# Patient Record
Sex: Female | Born: 1950 | Race: White | Hispanic: No | Marital: Married | Smoking: Never smoker
Health system: Southern US, Community
[De-identification: ages and names within clinical notes are randomized; demographics above are authoritative.]

## PROBLEM LIST (undated history)

## (undated) DIAGNOSIS — S82001A Unspecified fracture of right patella, initial encounter for closed fracture: Secondary | ICD-10-CM

## (undated) DIAGNOSIS — E785 Hyperlipidemia, unspecified: Secondary | ICD-10-CM

## (undated) DIAGNOSIS — S62109A Fracture of unspecified carpal bone, unspecified wrist, initial encounter for closed fracture: Secondary | ICD-10-CM

## (undated) DIAGNOSIS — M1990 Unspecified osteoarthritis, unspecified site: Secondary | ICD-10-CM

## (undated) DIAGNOSIS — M810 Age-related osteoporosis without current pathological fracture: Secondary | ICD-10-CM

## (undated) DIAGNOSIS — F509 Eating disorder, unspecified: Secondary | ICD-10-CM

## (undated) DIAGNOSIS — I10 Essential (primary) hypertension: Secondary | ICD-10-CM

## (undated) HISTORY — PX: HYSTEROSCOPY: SHX211

## (undated) HISTORY — DX: Essential (primary) hypertension: I10

## (undated) HISTORY — PX: AUGMENTATION MAMMAPLASTY: SUR837

## (undated) HISTORY — DX: Fracture of unspecified carpal bone, unspecified wrist, initial encounter for closed fracture: S62.109A

## (undated) HISTORY — PX: BREAST ENHANCEMENT SURGERY: SHX7

## (undated) HISTORY — DX: Age-related osteoporosis without current pathological fracture: M81.0

## (undated) HISTORY — DX: Eating disorder, unspecified: F50.9

---

## 1998-05-03 ENCOUNTER — Other Ambulatory Visit: Admission: RE | Admit: 1998-05-03 | Discharge: 1998-05-03 | Payer: Self-pay | Admitting: Obstetrics and Gynecology

## 1998-06-07 ENCOUNTER — Other Ambulatory Visit: Admission: RE | Admit: 1998-06-07 | Discharge: 1998-06-07 | Payer: Self-pay | Admitting: Obstetrics and Gynecology

## 1998-10-11 ENCOUNTER — Other Ambulatory Visit: Admission: RE | Admit: 1998-10-11 | Discharge: 1998-10-11 | Payer: Self-pay | Admitting: Obstetrics and Gynecology

## 1999-03-20 ENCOUNTER — Other Ambulatory Visit: Admission: RE | Admit: 1999-03-20 | Discharge: 1999-03-20 | Payer: Self-pay | Admitting: Obstetrics and Gynecology

## 1999-07-22 ENCOUNTER — Encounter: Admission: RE | Admit: 1999-07-22 | Discharge: 1999-07-22 | Payer: Self-pay | Admitting: Obstetrics and Gynecology

## 1999-09-11 ENCOUNTER — Other Ambulatory Visit: Admission: RE | Admit: 1999-09-11 | Discharge: 1999-09-11 | Payer: Self-pay | Admitting: Obstetrics and Gynecology

## 2000-02-24 ENCOUNTER — Other Ambulatory Visit: Admission: RE | Admit: 2000-02-24 | Discharge: 2000-02-24 | Payer: Self-pay | Admitting: Obstetrics and Gynecology

## 2000-07-23 ENCOUNTER — Encounter: Admission: RE | Admit: 2000-07-23 | Discharge: 2000-07-23 | Payer: Self-pay | Admitting: Obstetrics and Gynecology

## 2000-07-23 ENCOUNTER — Encounter: Payer: Self-pay | Admitting: Obstetrics and Gynecology

## 2001-03-18 ENCOUNTER — Other Ambulatory Visit: Admission: RE | Admit: 2001-03-18 | Discharge: 2001-03-18 | Payer: Self-pay | Admitting: Obstetrics and Gynecology

## 2001-03-23 ENCOUNTER — Encounter: Payer: Self-pay | Admitting: Obstetrics and Gynecology

## 2001-03-23 ENCOUNTER — Encounter: Admission: RE | Admit: 2001-03-23 | Discharge: 2001-03-23 | Payer: Self-pay | Admitting: Obstetrics and Gynecology

## 2001-07-27 ENCOUNTER — Encounter: Payer: Self-pay | Admitting: Obstetrics and Gynecology

## 2001-07-27 ENCOUNTER — Encounter: Admission: RE | Admit: 2001-07-27 | Discharge: 2001-07-27 | Payer: Self-pay | Admitting: Obstetrics and Gynecology

## 2002-03-15 ENCOUNTER — Other Ambulatory Visit: Admission: RE | Admit: 2002-03-15 | Discharge: 2002-03-15 | Payer: Self-pay | Admitting: Obstetrics and Gynecology

## 2002-08-02 ENCOUNTER — Encounter: Admission: RE | Admit: 2002-08-02 | Discharge: 2002-08-02 | Payer: Self-pay | Admitting: *Deleted

## 2002-08-02 ENCOUNTER — Encounter: Payer: Self-pay | Admitting: Obstetrics and Gynecology

## 2003-03-17 ENCOUNTER — Other Ambulatory Visit: Admission: RE | Admit: 2003-03-17 | Discharge: 2003-03-17 | Payer: Self-pay | Admitting: Obstetrics and Gynecology

## 2003-04-04 ENCOUNTER — Encounter: Admission: RE | Admit: 2003-04-04 | Discharge: 2003-07-03 | Payer: Self-pay | Admitting: Family Medicine

## 2003-07-05 ENCOUNTER — Encounter: Admission: RE | Admit: 2003-07-05 | Discharge: 2003-10-03 | Payer: Self-pay | Admitting: Family Medicine

## 2003-08-17 ENCOUNTER — Encounter: Admission: RE | Admit: 2003-08-17 | Discharge: 2003-08-17 | Payer: Self-pay | Admitting: Obstetrics and Gynecology

## 2004-03-18 ENCOUNTER — Other Ambulatory Visit: Admission: RE | Admit: 2004-03-18 | Discharge: 2004-03-18 | Payer: Self-pay | Admitting: Obstetrics and Gynecology

## 2004-08-19 ENCOUNTER — Encounter: Admission: RE | Admit: 2004-08-19 | Discharge: 2004-08-19 | Payer: Self-pay | Admitting: Obstetrics and Gynecology

## 2005-04-25 ENCOUNTER — Other Ambulatory Visit: Admission: RE | Admit: 2005-04-25 | Discharge: 2005-04-25 | Payer: Self-pay | Admitting: Obstetrics and Gynecology

## 2005-10-17 ENCOUNTER — Encounter: Admission: RE | Admit: 2005-10-17 | Discharge: 2005-10-17 | Payer: Self-pay | Admitting: Obstetrics and Gynecology

## 2006-05-06 ENCOUNTER — Other Ambulatory Visit: Admission: RE | Admit: 2006-05-06 | Discharge: 2006-05-06 | Payer: Self-pay | Admitting: Obstetrics and Gynecology

## 2006-10-26 ENCOUNTER — Encounter: Admission: RE | Admit: 2006-10-26 | Discharge: 2006-10-26 | Payer: Self-pay | Admitting: Obstetrics and Gynecology

## 2007-05-21 ENCOUNTER — Other Ambulatory Visit: Admission: RE | Admit: 2007-05-21 | Discharge: 2007-05-21 | Payer: Self-pay | Admitting: Obstetrics and Gynecology

## 2007-10-29 ENCOUNTER — Encounter: Admission: RE | Admit: 2007-10-29 | Discharge: 2007-10-29 | Payer: Self-pay | Admitting: Obstetrics and Gynecology

## 2008-05-22 ENCOUNTER — Other Ambulatory Visit: Admission: RE | Admit: 2008-05-22 | Discharge: 2008-05-22 | Payer: Self-pay | Admitting: Obstetrics and Gynecology

## 2008-05-29 ENCOUNTER — Encounter: Admission: RE | Admit: 2008-05-29 | Discharge: 2008-05-29 | Payer: Self-pay | Admitting: Obstetrics and Gynecology

## 2008-11-30 ENCOUNTER — Encounter: Admission: RE | Admit: 2008-11-30 | Discharge: 2008-11-30 | Payer: Self-pay | Admitting: Obstetrics and Gynecology

## 2009-12-06 ENCOUNTER — Encounter: Admission: RE | Admit: 2009-12-06 | Discharge: 2009-12-06 | Payer: Self-pay | Admitting: Obstetrics and Gynecology

## 2010-02-26 ENCOUNTER — Encounter: Admission: RE | Admit: 2010-02-26 | Discharge: 2010-02-26 | Payer: Self-pay | Admitting: Obstetrics and Gynecology

## 2010-10-27 ENCOUNTER — Encounter: Payer: Self-pay | Admitting: Obstetrics and Gynecology

## 2010-11-27 ENCOUNTER — Other Ambulatory Visit: Payer: Self-pay | Admitting: Obstetrics and Gynecology

## 2010-11-27 DIAGNOSIS — Z1231 Encounter for screening mammogram for malignant neoplasm of breast: Secondary | ICD-10-CM

## 2010-12-10 ENCOUNTER — Ambulatory Visit: Payer: Self-pay

## 2010-12-12 ENCOUNTER — Ambulatory Visit
Admission: RE | Admit: 2010-12-12 | Discharge: 2010-12-12 | Disposition: A | Discharge status: Home / Self Care | Payer: BC Managed Care – PPO | Source: Ambulatory Visit | Attending: Obstetrics and Gynecology | Admitting: Obstetrics and Gynecology

## 2010-12-12 DIAGNOSIS — Z1231 Encounter for screening mammogram for malignant neoplasm of breast: Secondary | ICD-10-CM

## 2011-07-15 ENCOUNTER — Other Ambulatory Visit: Payer: Self-pay | Admitting: Obstetrics and Gynecology

## 2011-07-15 DIAGNOSIS — M949 Disorder of cartilage, unspecified: Secondary | ICD-10-CM

## 2011-07-22 ENCOUNTER — Ambulatory Visit
Admission: RE | Admit: 2011-07-22 | Discharge: 2011-07-22 | Disposition: A | Discharge status: Home / Self Care | Payer: BC Managed Care – PPO | Source: Ambulatory Visit | Attending: Obstetrics and Gynecology | Admitting: Obstetrics and Gynecology

## 2011-07-22 DIAGNOSIS — M899 Disorder of bone, unspecified: Secondary | ICD-10-CM

## 2011-10-07 DIAGNOSIS — S62109A Fracture of unspecified carpal bone, unspecified wrist, initial encounter for closed fracture: Secondary | ICD-10-CM

## 2011-10-07 HISTORY — DX: Fracture of unspecified carpal bone, unspecified wrist, initial encounter for closed fracture: S62.109A

## 2013-07-25 ENCOUNTER — Encounter: Payer: Self-pay | Admitting: Certified Nurse Midwife

## 2013-07-27 ENCOUNTER — Encounter: Payer: Self-pay | Admitting: Certified Nurse Midwife

## 2013-07-27 ENCOUNTER — Telehealth: Payer: Self-pay | Admitting: Certified Nurse Midwife

## 2013-07-27 ENCOUNTER — Ambulatory Visit (INDEPENDENT_AMBULATORY_CARE_PROVIDER_SITE_OTHER): Payer: BC Managed Care – PPO | Admitting: Certified Nurse Midwife

## 2013-07-27 VITALS — BP 116/68 | HR 68 | Resp 16 | Ht 63.75 in | Wt 141.0 lb

## 2013-07-27 DIAGNOSIS — Z0000 Encounter for general adult medical examination without abnormal findings: Secondary | ICD-10-CM

## 2013-07-27 DIAGNOSIS — Z01419 Encounter for gynecological examination (general) (routine) without abnormal findings: Secondary | ICD-10-CM

## 2013-07-27 DIAGNOSIS — M810 Age-related osteoporosis without current pathological fracture: Secondary | ICD-10-CM

## 2013-07-27 DIAGNOSIS — N951 Menopausal and female climacteric states: Secondary | ICD-10-CM

## 2013-07-27 MED ORDER — MEDROXYPROGESTERONE ACETATE 10 MG PO TABS: ORAL_TABLET | ORAL | Status: DC

## 2013-07-27 MED ORDER — ESTRADIOL 0.05 MG/24HR TD PTWK: 1.0000 | MEDICATED_PATCH | TRANSDERMAL | Status: DC

## 2013-07-27 NOTE — Telephone Encounter (Signed)
Cheryl Glass, okay to order Dexa? Last Dexa 07/2011.  Bone Mineral Density (BMD): 0.728 g/cm2  Young Adult T Score: -2.6  Z Score: -1.4  LEFT FEMUR NECK  Bone Mineral Density (BMD): 0.600 g/cm2  Young Adult T Score: -2.2  Z Score: -1.0  ASSESSMENT: Patient's diagnostic category is OSTEOPOROSIS by WHO  Criteria.

## 2013-07-27 NOTE — Telephone Encounter (Signed)
Chief Complaint  Patient presents with   bone density order  pt calling to get bone density order sent to the breast center.

## 2013-07-27 NOTE — Patient Instructions (Signed)

## 2013-07-27 NOTE — Progress Notes (Signed)
Note reviewed, agree with plan.  Junia Nygren, MD  

## 2013-07-27 NOTE — Telephone Encounter (Signed)
Yes, she has osteoporosis on medication

## 2013-07-27 NOTE — Telephone Encounter (Signed)
Ordered and sent for signature. Patient notified. She will call for an appointment.

## 2013-07-27 NOTE — Progress Notes (Signed)
62 y.o. G47P1001 Married Caucasian Fe here for annual exam. Having withdrawal bleeding with Provera each month, with Climara patch. Sees PCP for aex ,labs and hypertension management. Medication stable. Due for bone density this year will schedule. Has restarted Fosamax per PCP due to osteoporosis. No other health issues.  Patient's last menstrual period was 07/10/2013.          Sexually active: yes  The current method of family planning is husband vasectomy.    Exercising: yes  cardio & weights Smoker:  no  Health Maintenance: Pap:  07-20-12 neg HPV HR neg MMG:  2/13 Colonoscopy: none refuses ever BMD:   2012 TDaP:  2008 Labs: none Self breast exam: done occ   reports that she has never smoked. She does not have any smokeless tobacco history on file. She reports that she does not drink alcohol.  Past Medical History  Diagnosis Date  . Eating disorder   . Wrist fracture 2013    right  . Hypertension   . Osteoporosis     Past Surgical History  Procedure Laterality Date  . Hysteroscopy    . Cesarean section    . Breast enhancement surgery      Current Outpatient Prescriptions  Medication Sig Dispense Refill  . alendronate (FOSAMAX) 70 MG tablet Take 70 mg by mouth every 7 (seven) days. Take with a full glass of water on an empty stomach.      . Calcium-Vitamin D-Vitamin K (VIACTIV) 500-500-40 MG-UNT-MCG CHEW Chew by mouth.      . cholecalciferol (VITAMIN D) 1000 UNITS tablet Take 1,000 Units by mouth daily.      Marland Kitchen estradiol (CLIMARA - DOSED IN MG/24 HR) 0.05 mg/24hr patch Place 1 patch onto the skin once a week.      Marland Kitchen lisinopril (PRINIVIL,ZESTRIL) 10 MG tablet Take 10 mg by mouth daily.      . medroxyPROGESTERone (PROVERA) 10 MG tablet Take 10 mg by mouth daily.       No current facility-administered medications for this visit.    Family History  Problem Relation Age of Onset  . Thyroid disease Sister     hypothyroid  . Osteoporosis Maternal Grandmother   . Cancer  Father     pancreatic  . Diabetes Father     ROS:  Pertinent items are noted in HPI.  Otherwise, a comprehensive ROS was negative.  Exam:   BP 116/68  Pulse 68  Resp 16  Ht 5' 3.75" (1.619 m)  Wt 141 lb (63.957 kg)  BMI 24.4 kg/m2  LMP 07/10/2013 Height: 5' 3.75" (161.9 cm)  Ht Readings from Last 3 Encounters:  07/27/13 5' 3.75" (1.619 m)    General appearance: alert, cooperative and appears stated age Head: Normocephalic, without obvious abnormality, atraumatic Neck: no adenopathy, supple, symmetrical, trachea midline and thyroid normal to inspection and palpation Lungs: clear to auscultation bilaterally Breasts: normal appearance, no masses or tenderness, No nipple retraction or dimpling, No nipple discharge or bleeding, No axillary or supraclavicular adenopathy Heart: regular rate and rhythm Abdomen: soft, non-tender; no masses,  no organomegaly Extremities: extremities normal, atraumatic, no cyanosis or edema Skin: Skin color, texture, turgor normal. No rashes or lesions Lymph nodes: Cervical, supraclavicular, and axillary nodes normal. No abnormal inguinal nodes palpated Neurologic: Grossly normal   Pelvic: External genitalia:  no lesions              Urethra:  normal appearing urethra with no masses, tenderness or lesions  Bartholin's and Skene's: normal                 Vagina: normal appearing vagina with normal color and discharge, no lesions              Cervix: normal,non tender              Pap taken: no Bimanual Exam:  Uterus:  normal size, contour, position, consistency, mobility, non-tender and anteverted              Adnexa: normal adnexa and no mass, fullness, tenderness               Rectovaginal: Confirms               Anus:  normal sphincter tone, no lesions  A:  Well Woman with normal exam  Menopausal on HRT desires continuance  Hypertension stable medication with PCP management  Right hand second finger nodule  P:   Reviewed health and  wellness pertinent to exam  Discussed risks and benefits or HRT patient desires continuance. Discussed if bleeding occur other than right after provera completion needs to notify, agreeable  Rx Climara see order  Rx Provera see order  Continue follow up as recommended  Discussed finding with patient, recommended she see hand specialist, patient will schedule.  Pap smear as per guidelines   Mammogram yearly pap smear not taken today IFOB dispensed  counseled on breast self exam, mammography screening, use and side effects of HRT, menopause, adequate intake of calcium and vitamin D, diet and exercise  return annually or prn  An After Visit Summary was printed and given to the patient.

## 2013-07-29 ENCOUNTER — Ambulatory Visit
Admission: RE | Admit: 2013-07-29 | Discharge: 2013-07-29 | Disposition: A | Discharge status: Home / Self Care | Payer: BC Managed Care – PPO | Source: Ambulatory Visit | Attending: Certified Nurse Midwife | Admitting: Certified Nurse Midwife

## 2013-07-29 DIAGNOSIS — M810 Age-related osteoporosis without current pathological fracture: Secondary | ICD-10-CM

## 2014-07-28 ENCOUNTER — Other Ambulatory Visit: Payer: Self-pay | Admitting: Certified Nurse Midwife

## 2014-07-28 NOTE — Telephone Encounter (Signed)
Last AEX and refill 07/27/13 #90 tabs/ 4R Next appt 08/03/14 MMG 11/2011?  Please advise.

## 2014-08-03 ENCOUNTER — Ambulatory Visit: Payer: BC Managed Care – PPO | Admitting: Certified Nurse Midwife

## 2014-08-07 ENCOUNTER — Encounter: Payer: Self-pay | Admitting: Certified Nurse Midwife

## 2014-08-08 ENCOUNTER — Other Ambulatory Visit: Payer: Self-pay | Admitting: Certified Nurse Midwife

## 2014-08-08 NOTE — Telephone Encounter (Signed)
Last refilled/AEX: 07/27/13 #12/4 rfs by Ms.Debbie AEX Scheduled: 08/24/14 with Ms. Debbie Last Mammogram: 12/12/10 Bi-Rads 1: Negative  Please Advise.

## 2014-08-17 ENCOUNTER — Other Ambulatory Visit: Payer: Self-pay | Admitting: Certified Nurse Midwife

## 2014-08-17 NOTE — Telephone Encounter (Signed)
Last AEX 07/27/13  Last refill 08/08/14 #4patch/0R Next appt 08/24/14   Patient should have enough to last until appt

## 2014-08-24 ENCOUNTER — Ambulatory Visit: Payer: BC Managed Care – PPO | Admitting: Certified Nurse Midwife

## 2014-09-11 ENCOUNTER — Other Ambulatory Visit: Payer: Self-pay | Admitting: Certified Nurse Midwife

## 2014-09-11 NOTE — Telephone Encounter (Signed)
Medication refill request: Climara. Last refill 08/08/14 #4patch/0refill Last AEX: 07/27/13 Next AEX: 10/11/14 Last MMG (if hormonal medication request): 12/2010 BIRADS1: Neg Refill authorized: 1

## 2014-09-11 NOTE — Telephone Encounter (Signed)
Patient needs mammogram 

## 2014-09-12 ENCOUNTER — Other Ambulatory Visit: Payer: Self-pay

## 2014-09-12 DIAGNOSIS — Z1231 Encounter for screening mammogram for malignant neoplasm of breast: Secondary | ICD-10-CM

## 2014-09-12 NOTE — Telephone Encounter (Addendum)
Patient called back. [facility] a record of her [facility]G since 2012. She made appt 10/04/14 to get MMG.  She states she just needs one month supply until she gets to her appt.  Please advise.

## 2014-09-12 NOTE — Addendum Note (Signed)
Addended by: Dion BodyBELTRAN, REINA C on: 09/12/2014 09:52 AM   Modules accepted: Orders

## 2014-09-12 NOTE — Telephone Encounter (Signed)
Pt states she [facility]. She had her last MMG about 10/2013- 11/2013.   Called [facility], Zella BallRobin states pt's last MMG was in 2012.  Called [facility] states they do not have a record of pt from 2014 or 2015.    Called pt, she is sure she had a MMG this year, she will [facility] and ask about her records. She will call me back.

## 2014-09-12 NOTE — Telephone Encounter (Signed)
Will not refill without mammogram

## 2014-09-13 NOTE — Telephone Encounter (Signed)
Called pt was notified of Mrs Cheryl Glass not filling Rx.  Patient has concerns about what stopping this Rx would do to her body. I offered her a call from the triage nurse. Patient states that it is ok "she doesn't want to waste anybody's time she will figure it out herself and doesn't need a call from our triage nurse."  I insisted we can call her, it won't be a problem, that's what we are here for. She still declined call from Triage stating that if Mrs Cheryl Glass won't refill Rx, it won't make a difference anyway. Patient appreciative stating Mrs Cheryl Glass has done many extra things for her.   Mrs Cheryl Glass and Cheryl Glass FYI.

## 2014-10-10 ENCOUNTER — Ambulatory Visit
Admission: RE | Admit: 2014-10-10 | Discharge: 2014-10-10 | Disposition: A | Discharge status: Home / Self Care | Payer: BLUE CROSS/BLUE SHIELD | Source: Ambulatory Visit

## 2014-10-10 DIAGNOSIS — Z1231 Encounter for screening mammogram for malignant neoplasm of breast: Secondary | ICD-10-CM

## 2014-10-11 ENCOUNTER — Ambulatory Visit: Payer: BC Managed Care – PPO | Admitting: Certified Nurse Midwife

## 2014-12-05 ENCOUNTER — Ambulatory Visit: Payer: Self-pay | Admitting: Certified Nurse Midwife

## 2015-06-19 ENCOUNTER — Encounter: Payer: Self-pay | Admitting: Certified Nurse Midwife

## 2015-06-19 ENCOUNTER — Ambulatory Visit (INDEPENDENT_AMBULATORY_CARE_PROVIDER_SITE_OTHER): Payer: BLUE CROSS/BLUE SHIELD | Admitting: Certified Nurse Midwife

## 2015-06-19 VITALS — BP 120/74 | HR 72 | Resp 20 | Ht 63.75 in | Wt 138.0 lb

## 2015-06-19 DIAGNOSIS — Z01419 Encounter for gynecological examination (general) (routine) without abnormal findings: Secondary | ICD-10-CM | POA: Diagnosis not present

## 2015-06-19 DIAGNOSIS — Z124 Encounter for screening for malignant neoplasm of cervix: Secondary | ICD-10-CM

## 2015-06-19 NOTE — Progress Notes (Signed)
Reviewed personally.  M. Suzanne Thereasa Iannello, MD.  

## 2015-06-19 NOTE — Progress Notes (Signed)
64 y.o. G9P1001 Married  Caucasian Fe here for annual exam.  Menopausal no HRT. Denies vaginal bleeding. Patient stopped HRT and had no problems stopping. She has been on Fosamax with PCP management, but has stopped now. Having vaginal dryness now open to suggestions. Sees PCP for hypertension and cholesterol management, aex/labs. All stable at present. Has employment now in [place], happy with new area. No other health issues today.  No LMP recorded.          Sexually active: Yes.    The current method of family planning is vasectomy.    Exercising: Yes.    Walking, and stretching Smoker:  yes  Health Maintenance: Pap:  07-20-12 neg HPV HR neg MMG: 10-10-14 category b density,birads 1:neg Colonoscopy:  None, pt refuses BMD:   2012 TDaP:  2008 Labs: pcp Self breast exam: not done    reports that she has never smoked. She does not have any smokeless tobacco history on file. She reports that she does not drink alcohol or use illicit drugs.  Past Medical History  Diagnosis Date  . Eating disorder   . Wrist fracture 2013    right  . Hypertension   . Osteoporosis     Past Surgical History  Procedure Laterality Date  . Hysteroscopy    . Cesarean section    . Breast enhancement surgery      Current Outpatient Prescriptions  Medication Sig Dispense Refill  . atorvastatin (LIPITOR) 10 MG tablet Take 10 mg by mouth daily.  3  . lisinopril (PRINIVIL,ZESTRIL) 10 MG tablet Take 10 mg by mouth daily.     No current facility-administered medications for this visit.    Family History  Problem Relation Age of Onset  . Thyroid disease Sister     hypothyroid  . Osteoporosis Maternal Grandmother   . Cancer Father     pancreatic  . Diabetes Father     ROS:  Pertinent items are noted in HPI.  Otherwise, a comprehensive ROS was negative.  Exam:   BP 120/74 mmHg  Pulse 72  Resp 20  Ht 5' 3.75" (1.619 m)  Wt 138 lb (62.596 kg)  BMI 23.88 kg/m2  LMP  Height: 5' 3.75" (161.9 cm) Ht  Readings from Last 3 Encounters:  06/19/15 5' 3.75" (1.619 m)  07/27/13 5' 3.75" (1.619 m)    General appearance: alert, cooperative and appears stated age Head: Normocephalic, without obvious abnormality, atraumatic Neck: no adenopathy, supple, symmetrical, trachea midline and thyroid normal to inspection and palpation Lungs: clear to auscultation bilaterally Breasts: normal appearance, no masses or tenderness, No nipple retraction or dimpling, No nipple discharge or bleeding, No axillary or supraclavicular adenopathy Heart: regular rate and rhythm Abdomen: soft, non-tender; no masses,  no organomegaly Extremities: extremities normal, atraumatic, no cyanosis or edema Skin: Skin color, texture, turgor normal. No rashes or lesions Lymph nodes: Cervical, supraclavicular, and axillary nodes normal. No abnormal inguinal nodes palpated Neurologic: Grossly normal   Pelvic: External genitalia:  no lesions              Urethra:  normal appearing urethra with no masses, tenderness or lesions              Bartholin's and Skene's: normal                 Vagina: normal appearing vagina with normal color and discharge, no lesions              Cervix: normal, nontender, no lesions  Pap taken: Yes.   Bimanual Exam:  Uterus:  normal size, contour, position, consistency, mobility, non-tender              Adnexa: normal adnexa and no mass, fullness, tenderness               Rectovaginal: Confirms               Anus:  normal sphincter tone, no lesions  Chaperone present: yes  A:  Well Woman with normal exam  Menopausal no HRT  Vaginal dryness  Hypertension/cholesterol with PCP management  Osteoporosis, has stopped Fosamax and working with diet and exercise, PCP management  P:   Reviewed health and wellness pertinent to exam  Aware of need to evaluate if vaginal bleeding  Discussed Estrogen vaginal cream option, patient declines any type of estrogen product. Discussed Coconut oil  daily, patient plans to try. Instructions given. Will advise if issues.  Continue MD follow up as indicated.  Discussed importance of good diet with calcium and vitamin D  Pap smear as above with HPVHR    counseled on breast self exam, mammography screening, osteoporosis, adequate intake of calcium and vitamin D, diet and exercise  return annually or prn  An After Visit Summary was printed and given to the patient.

## 2015-06-19 NOTE — Patient Instructions (Addendum)

## 2015-06-20 NOTE — Addendum Note (Signed)
Addended by: Verner Chol on: 06/20/2015 01:36 PM   Modules accepted: Orders, SmartSet

## 2015-06-22 LAB — IPS PAP TEST WITH HPV

## 2015-09-10 ENCOUNTER — Other Ambulatory Visit: Payer: Self-pay

## 2015-09-10 DIAGNOSIS — Z1231 Encounter for screening mammogram for malignant neoplasm of breast: Secondary | ICD-10-CM

## 2015-10-12 ENCOUNTER — Ambulatory Visit: Payer: BLUE CROSS/BLUE SHIELD

## 2015-10-29 ENCOUNTER — Ambulatory Visit: Payer: BLUE CROSS/BLUE SHIELD

## 2015-11-07 ENCOUNTER — Ambulatory Visit: Payer: BLUE CROSS/BLUE SHIELD

## 2015-11-07 ENCOUNTER — Ambulatory Visit
Admission: RE | Admit: 2015-11-07 | Discharge: 2015-11-07 | Disposition: A | Discharge status: Home / Self Care | Payer: BLUE CROSS/BLUE SHIELD | Source: Ambulatory Visit

## 2015-11-07 DIAGNOSIS — Z1231 Encounter for screening mammogram for malignant neoplasm of breast: Secondary | ICD-10-CM

## 2016-06-19 ENCOUNTER — Ambulatory Visit: Payer: BLUE CROSS/BLUE SHIELD | Admitting: Certified Nurse Midwife

## 2016-06-25 ENCOUNTER — Ambulatory Visit: Payer: BLUE CROSS/BLUE SHIELD | Admitting: Certified Nurse Midwife

## 2016-09-05 ENCOUNTER — Ambulatory Visit: Payer: BLUE CROSS/BLUE SHIELD | Admitting: Certified Nurse Midwife

## 2016-10-03 ENCOUNTER — Other Ambulatory Visit: Payer: Self-pay | Admitting: Certified Nurse Midwife

## 2016-10-03 DIAGNOSIS — Z1231 Encounter for screening mammogram for malignant neoplasm of breast: Secondary | ICD-10-CM

## 2016-10-08 ENCOUNTER — Ambulatory Visit: Payer: BLUE CROSS/BLUE SHIELD | Admitting: Certified Nurse Midwife

## 2016-10-21 ENCOUNTER — Encounter: Payer: Self-pay | Admitting: Certified Nurse Midwife

## 2016-10-21 ENCOUNTER — Ambulatory Visit (INDEPENDENT_AMBULATORY_CARE_PROVIDER_SITE_OTHER): Payer: BLUE CROSS/BLUE SHIELD | Admitting: Certified Nurse Midwife

## 2016-10-21 VITALS — BP 124/82 | HR 72 | Resp 16 | Ht 63.25 in | Wt 140.0 lb

## 2016-10-21 DIAGNOSIS — Z01419 Encounter for gynecological examination (general) (routine) without abnormal findings: Secondary | ICD-10-CM | POA: Diagnosis not present

## 2016-10-21 NOTE — Progress Notes (Signed)
66 y.o. G82P1001 Married  Caucasian Fe here for annual exam.  Menopausal no HRT. Denies vaginal bleeding or vaginal dryness.  Sees S[facility]el, yearly/labs and management of hypertension/cholesterol. Changed employment at home now. No health issues today. Son married this in 2017!  Now hiking for exercise.  Patient's last menstrual period was 07/10/2013.          Sexually active: Yes.    The current method of family planning is vasectomy.    Exercising: Yes.    hiking & fitness center Smoker:  no  Health Maintenance: Pap:  06-20-15 neg HPV HR neg MMG:  11-07-15 category b density birads 1:neg Colonoscopy:  None, pt refuses BMD:   2014 TDaP:  2008, pt to do pcp Shingles: no Pneumonia: no Hep C and HIV: not done Labs: pcp Self breast exam: done monthly   reports that she has never smoked. She has never used smokeless tobacco. She reports that she does not drink alcohol or use drugs.  Past Medical History:  Diagnosis Date  . Eating disorder   . Hypertension   . Osteoporosis   . Wrist fracture 2013   right    Past Surgical History:  Procedure Laterality Date  . BREAST ENHANCEMENT SURGERY    . CESAREAN SECTION    . HYSTEROSCOPY      Current Outpatient Prescriptions  Medication Sig Dispense Refill  . atorvastatin (LIPITOR) 10 MG tablet Take 10 mg by mouth daily.  3  . lisinopril (PRINIVIL,ZESTRIL) 10 MG tablet Take 10 mg by mouth daily.     No current facility-administered medications for this visit.     Family History  Problem Relation Age of Onset  . Thyroid disease Sister     hypothyroid  . Osteoporosis Maternal Grandmother   . Cancer Father     pancreatic  . Diabetes Father     ROS:  Pertinent items are noted in HPI.  Otherwise, a comprehensive ROS was negative.  Exam:   BP 124/82   Pulse 72   Resp 16   Ht 5' 3.25" (1.607 m)   Wt 140 lb (63.5 kg)   LMP 07/10/2013   BMI 24.60 kg/m  Height: 5' 3.25" (160.7 cm) Ht Readings from Last 3 Encounters:  10/21/16  5' 3.25" (1.607 m)  06/19/15 5' 3.75" (1.619 m)  07/27/13 5' 3.75" (1.619 m)    General appearance: alert, cooperative and appears stated age Head: Normocephalic, without obvious abnormality, atraumatic Neck: no adenopathy, supple, symmetrical, trachea midline and thyroid normal to inspection and palpation Lungs: clear to auscultation bilaterally Breasts: normal appearance, no masses or tenderness, No nipple retraction or dimpling, No nipple discharge or bleeding, No axillary or supraclavicular adenopathy Heart: regular rate and rhythm Abdomen: soft, non-tender; no masses,  no organomegaly Extremities: extremities normal, atraumatic, no cyanosis or edema Skin: Skin color, texture, turgor normal. No rashes or lesions Lymph nodes: Cervical, supraclavicular, and axillary nodes normal. No abnormal inguinal nodes palpated Neurologic: Grossly normal   Pelvic: External genitalia:  no lesions              Urethra:  normal appearing urethra with no masses, tenderness or lesions              Bartholin's and Skene's: normal                 Vagina: normal appearing vagina with normal color and discharge, no lesions              Cervix: multiparous  appearance, no cervical motion tenderness and no lesions              Pap taken: No. Bimanual Exam:  Uterus:  normal size, contour, position, consistency, mobility, non-tender              Adnexa: normal adnexa and no mass, fullness, tenderness               Rectovaginal: Confirms               Anus:  normal sphincter tone, no lesions  Chaperone present: yes  A:  Well Woman with normal exam  Menopausal no HRT  Cholesterol/hypertension with PCP management  Mammogram  Due 2/18, has scheduled  Immunization due  P:   Reviewed health and wellness pertinent to exam  Aware of need of evaluation if vaginal bleeding.  Continue with PCP as indicated and plans to do TDAP with PCP  Pap smear as above not taken    counseled on breast self exam,  mammography screening, menopause, adequate intake of calcium and vitamin D, diet and exercise  return annually or prn  An After Visit Summary was printed and given to the patient.

## 2016-10-21 NOTE — Patient Instructions (Signed)

## 2016-10-23 NOTE — Progress Notes (Signed)
Encounter reviewed Jill Jertson, MD   

## 2016-10-29 ENCOUNTER — Telehealth: Payer: Self-pay | Admitting: Certified Nurse Midwife

## 2016-10-29 DIAGNOSIS — Z8781 Personal history of (healed) traumatic fracture: Secondary | ICD-10-CM

## 2016-10-29 DIAGNOSIS — Z780 Asymptomatic menopausal state: Secondary | ICD-10-CM

## 2016-10-29 NOTE — Telephone Encounter (Signed)
[facility] via [facility]. Patient has been notified and will contact the [facility] to schedule.  Routing to provider for final review. Patient agreeable to disposition. Will close encounter.

## 2016-10-29 NOTE — Telephone Encounter (Signed)
Patient needs an order sent to [facility] so that she can schedule a BMD.

## 2016-11-11 ENCOUNTER — Ambulatory Visit: Payer: BLUE CROSS/BLUE SHIELD

## 2016-11-20 ENCOUNTER — Ambulatory Visit
Admission: RE | Admit: 2016-11-20 | Discharge: 2016-11-20 | Disposition: A | Discharge status: Home / Self Care | Payer: BLUE CROSS/BLUE SHIELD | Source: Ambulatory Visit | Attending: Certified Nurse Midwife | Admitting: Certified Nurse Midwife

## 2016-11-20 DIAGNOSIS — Z8781 Personal history of (healed) traumatic fracture: Secondary | ICD-10-CM

## 2016-11-20 DIAGNOSIS — Z780 Asymptomatic menopausal state: Secondary | ICD-10-CM

## 2016-11-20 DIAGNOSIS — Z1231 Encounter for screening mammogram for malignant neoplasm of breast: Secondary | ICD-10-CM

## 2016-11-25 ENCOUNTER — Telehealth: Payer: Self-pay

## 2016-11-25 NOTE — Telephone Encounter (Signed)
Patient notified of results. See lab 

## 2016-11-25 NOTE — Telephone Encounter (Signed)
-----   Message from Verner Choleborah S Leonard, CNM sent at 11/25/2016  9:30 AM EST ----- Notify patient that her bone density still showing osteopenia in left hip but no change, right hip showing change with Osteopenia now. Left forearm showed osteopenia also, no osteoporosis. She needs to have Vitamin D level checked if has not been done with other providers, to make sure adequate for calcium absorption. Recommend 4 servings of calcium in diet daily. Daily walking for weight bearing exercise. Avoid excessive coffee, tea or soda. Repeat in 2 years.

## 2016-11-25 NOTE — Telephone Encounter (Signed)
[id number]

## 2017-10-22 ENCOUNTER — Ambulatory Visit: Payer: BLUE CROSS/BLUE SHIELD | Admitting: Certified Nurse Midwife

## 2017-11-20 ENCOUNTER — Other Ambulatory Visit: Payer: Self-pay | Admitting: Certified Nurse Midwife

## 2017-11-20 DIAGNOSIS — Z1231 Encounter for screening mammogram for malignant neoplasm of breast: Secondary | ICD-10-CM

## 2017-12-09 ENCOUNTER — Ambulatory Visit
Admission: RE | Admit: 2017-12-09 | Discharge: 2017-12-09 | Disposition: A | Discharge status: Home / Self Care | Payer: BLUE CROSS/BLUE SHIELD | Source: Ambulatory Visit | Attending: Certified Nurse Midwife | Admitting: Certified Nurse Midwife

## 2017-12-09 DIAGNOSIS — Z1231 Encounter for screening mammogram for malignant neoplasm of breast: Secondary | ICD-10-CM

## 2018-08-03 DIAGNOSIS — E538 Deficiency of other specified B group vitamins: Secondary | ICD-10-CM | POA: Diagnosis not present

## 2018-08-03 DIAGNOSIS — Z23 Encounter for immunization: Secondary | ICD-10-CM | POA: Diagnosis not present

## 2018-08-03 DIAGNOSIS — M810 Age-related osteoporosis without current pathological fracture: Secondary | ICD-10-CM | POA: Diagnosis not present

## 2018-08-03 DIAGNOSIS — L608 Other nail disorders: Secondary | ICD-10-CM | POA: Diagnosis not present

## 2018-08-03 DIAGNOSIS — Z6822 Body mass index (BMI) 22.0-22.9, adult: Secondary | ICD-10-CM | POA: Diagnosis not present

## 2018-08-03 DIAGNOSIS — I10 Essential (primary) hypertension: Secondary | ICD-10-CM | POA: Diagnosis not present

## 2018-08-03 DIAGNOSIS — E7800 Pure hypercholesterolemia, unspecified: Secondary | ICD-10-CM | POA: Diagnosis not present

## 2018-08-31 DIAGNOSIS — M19041 Primary osteoarthritis, right hand: Secondary | ICD-10-CM | POA: Diagnosis not present

## 2018-08-31 DIAGNOSIS — M79641 Pain in right hand: Secondary | ICD-10-CM | POA: Diagnosis not present

## 2018-08-31 DIAGNOSIS — M79642 Pain in left hand: Secondary | ICD-10-CM | POA: Diagnosis not present

## 2018-08-31 DIAGNOSIS — R768 Other specified abnormal immunological findings in serum: Secondary | ICD-10-CM | POA: Diagnosis not present

## 2018-08-31 DIAGNOSIS — M79643 Pain in unspecified hand: Secondary | ICD-10-CM | POA: Diagnosis not present

## 2018-08-31 DIAGNOSIS — M19042 Primary osteoarthritis, left hand: Secondary | ICD-10-CM | POA: Diagnosis not present

## 2018-08-31 DIAGNOSIS — M1990 Unspecified osteoarthritis, unspecified site: Secondary | ICD-10-CM | POA: Diagnosis not present

## 2018-08-31 DIAGNOSIS — M2550 Pain in unspecified joint: Secondary | ICD-10-CM | POA: Diagnosis not present

## 2018-08-31 DIAGNOSIS — M79646 Pain in unspecified finger(s): Secondary | ICD-10-CM | POA: Diagnosis not present

## 2018-09-15 DIAGNOSIS — L600 Ingrowing nail: Secondary | ICD-10-CM | POA: Diagnosis not present

## 2018-09-15 DIAGNOSIS — B079 Viral wart, unspecified: Secondary | ICD-10-CM | POA: Diagnosis not present

## 2018-11-08 ENCOUNTER — Other Ambulatory Visit: Payer: Self-pay | Admitting: Certified Nurse Midwife

## 2018-11-08 DIAGNOSIS — Z1231 Encounter for screening mammogram for malignant neoplasm of breast: Secondary | ICD-10-CM

## 2018-12-13 ENCOUNTER — Ambulatory Visit: Payer: BLUE CROSS/BLUE SHIELD

## 2019-02-17 ENCOUNTER — Ambulatory Visit: Payer: BLUE CROSS/BLUE SHIELD

## 2019-03-02 DIAGNOSIS — M2550 Pain in unspecified joint: Secondary | ICD-10-CM | POA: Diagnosis not present

## 2019-03-02 DIAGNOSIS — M79646 Pain in unspecified finger(s): Secondary | ICD-10-CM | POA: Diagnosis not present

## 2019-03-02 DIAGNOSIS — M154 Erosive (osteo)arthritis: Secondary | ICD-10-CM | POA: Diagnosis not present

## 2019-03-02 DIAGNOSIS — M79643 Pain in unspecified hand: Secondary | ICD-10-CM | POA: Diagnosis not present

## 2019-03-02 DIAGNOSIS — M1990 Unspecified osteoarthritis, unspecified site: Secondary | ICD-10-CM | POA: Diagnosis not present

## 2019-03-02 DIAGNOSIS — R768 Other specified abnormal immunological findings in serum: Secondary | ICD-10-CM | POA: Diagnosis not present

## 2019-03-16 ENCOUNTER — Ambulatory Visit
Admission: RE | Admit: 2019-03-16 | Discharge: 2019-03-16 | Disposition: A | Discharge status: Home / Self Care | Payer: Medicare HMO | Source: Ambulatory Visit | Attending: Certified Nurse Midwife | Admitting: Certified Nurse Midwife

## 2019-03-16 ENCOUNTER — Other Ambulatory Visit: Payer: Self-pay

## 2019-03-16 DIAGNOSIS — Z1231 Encounter for screening mammogram for malignant neoplasm of breast: Secondary | ICD-10-CM

## 2019-04-06 DIAGNOSIS — H524 Presbyopia: Secondary | ICD-10-CM | POA: Diagnosis not present

## 2019-07-11 DIAGNOSIS — I10 Essential (primary) hypertension: Secondary | ICD-10-CM | POA: Diagnosis not present

## 2019-07-11 DIAGNOSIS — E7800 Pure hypercholesterolemia, unspecified: Secondary | ICD-10-CM | POA: Diagnosis not present

## 2019-07-11 DIAGNOSIS — Z780 Asymptomatic menopausal state: Secondary | ICD-10-CM | POA: Diagnosis not present

## 2019-07-13 ENCOUNTER — Other Ambulatory Visit: Payer: Self-pay | Admitting: Family Medicine

## 2019-07-13 DIAGNOSIS — R5381 Other malaise: Secondary | ICD-10-CM

## 2019-07-19 ENCOUNTER — Other Ambulatory Visit: Payer: Self-pay | Admitting: Family Medicine

## 2019-07-19 DIAGNOSIS — E2839 Other primary ovarian failure: Secondary | ICD-10-CM

## 2019-07-22 ENCOUNTER — Other Ambulatory Visit: Payer: Self-pay

## 2019-07-22 ENCOUNTER — Ambulatory Visit
Admission: RE | Admit: 2019-07-22 | Discharge: 2019-07-22 | Disposition: A | Discharge status: Home / Self Care | Payer: Medicare HMO | Source: Ambulatory Visit | Attending: Family Medicine | Admitting: Family Medicine

## 2019-07-22 DIAGNOSIS — Z780 Asymptomatic menopausal state: Secondary | ICD-10-CM | POA: Diagnosis not present

## 2019-07-22 DIAGNOSIS — M810 Age-related osteoporosis without current pathological fracture: Secondary | ICD-10-CM | POA: Diagnosis not present

## 2019-07-22 DIAGNOSIS — E2839 Other primary ovarian failure: Secondary | ICD-10-CM

## 2019-09-05 DIAGNOSIS — R768 Other specified abnormal immunological findings in serum: Secondary | ICD-10-CM | POA: Diagnosis not present

## 2019-09-05 DIAGNOSIS — M79646 Pain in unspecified finger(s): Secondary | ICD-10-CM | POA: Diagnosis not present

## 2019-09-05 DIAGNOSIS — M79643 Pain in unspecified hand: Secondary | ICD-10-CM | POA: Diagnosis not present

## 2019-09-05 DIAGNOSIS — M2550 Pain in unspecified joint: Secondary | ICD-10-CM | POA: Diagnosis not present

## 2019-09-05 DIAGNOSIS — M1990 Unspecified osteoarthritis, unspecified site: Secondary | ICD-10-CM | POA: Diagnosis not present

## 2019-09-05 DIAGNOSIS — M810 Age-related osteoporosis without current pathological fracture: Secondary | ICD-10-CM | POA: Diagnosis not present

## 2019-09-05 DIAGNOSIS — M154 Erosive (osteo)arthritis: Secondary | ICD-10-CM | POA: Diagnosis not present

## 2019-12-28 ENCOUNTER — Encounter: Payer: Self-pay | Admitting: Certified Nurse Midwife

## 2020-03-08 ENCOUNTER — Other Ambulatory Visit: Payer: Self-pay | Admitting: Family Medicine

## 2020-03-08 DIAGNOSIS — Z1231 Encounter for screening mammogram for malignant neoplasm of breast: Secondary | ICD-10-CM

## 2020-03-09 ENCOUNTER — Other Ambulatory Visit: Payer: Self-pay

## 2020-03-09 ENCOUNTER — Ambulatory Visit
Admission: RE | Admit: 2020-03-09 | Discharge: 2020-03-09 | Disposition: A | Discharge status: Home / Self Care | Payer: Medicare HMO | Source: Ambulatory Visit | Attending: Family Medicine | Admitting: Family Medicine

## 2020-03-09 DIAGNOSIS — Z1231 Encounter for screening mammogram for malignant neoplasm of breast: Secondary | ICD-10-CM | POA: Diagnosis not present

## 2020-04-17 ENCOUNTER — Emergency Department (HOSPITAL_COMMUNITY)
Admission: EM | Admit: 2020-04-17 | Discharge: 2020-04-17 | Disposition: A | Discharge status: Home Health | Payer: Medicare HMO | Attending: Emergency Medicine | Admitting: Emergency Medicine

## 2020-04-17 ENCOUNTER — Emergency Department (HOSPITAL_COMMUNITY): Payer: Medicare HMO

## 2020-04-17 ENCOUNTER — Encounter (HOSPITAL_COMMUNITY): Payer: Self-pay | Admitting: *Deleted

## 2020-04-17 ENCOUNTER — Other Ambulatory Visit: Payer: Self-pay

## 2020-04-17 DIAGNOSIS — Y929 Unspecified place or not applicable: Secondary | ICD-10-CM | POA: Diagnosis not present

## 2020-04-17 DIAGNOSIS — S80911A Unspecified superficial injury of right knee, initial encounter: Secondary | ICD-10-CM | POA: Diagnosis present

## 2020-04-17 DIAGNOSIS — I10 Essential (primary) hypertension: Secondary | ICD-10-CM | POA: Insufficient documentation

## 2020-04-17 DIAGNOSIS — R52 Pain, unspecified: Secondary | ICD-10-CM | POA: Diagnosis not present

## 2020-04-17 DIAGNOSIS — Y999 Unspecified external cause status: Secondary | ICD-10-CM | POA: Diagnosis not present

## 2020-04-17 DIAGNOSIS — Y9301 Activity, walking, marching and hiking: Secondary | ICD-10-CM | POA: Diagnosis not present

## 2020-04-17 DIAGNOSIS — W010XXA Fall on same level from slipping, tripping and stumbling without subsequent striking against object, initial encounter: Secondary | ICD-10-CM | POA: Insufficient documentation

## 2020-04-17 DIAGNOSIS — Z79899 Other long term (current) drug therapy: Secondary | ICD-10-CM | POA: Diagnosis not present

## 2020-04-17 DIAGNOSIS — S82001A Unspecified fracture of right patella, initial encounter for closed fracture: Secondary | ICD-10-CM | POA: Diagnosis not present

## 2020-04-17 DIAGNOSIS — W19XXXA Unspecified fall, initial encounter: Secondary | ICD-10-CM | POA: Diagnosis not present

## 2020-04-17 DIAGNOSIS — S82041A Displaced comminuted fracture of right patella, initial encounter for closed fracture: Secondary | ICD-10-CM | POA: Diagnosis not present

## 2020-04-17 DIAGNOSIS — M25561 Pain in right knee: Secondary | ICD-10-CM | POA: Diagnosis not present

## 2020-04-17 MED ORDER — TRAMADOL HCL 50 MG PO TABS
50.0000 mg | ORAL_TABLET | Freq: Four times a day (QID) | ORAL | 0 refills | Status: DC | PRN
Start: 2020-04-17 — End: 2020-04-20

## 2020-04-17 NOTE — ED Provider Notes (Signed)
[facility]-EMERGENCY DEPT Provider Note   CSN: [id number] Arrival date & time: 04/17/20  1536     History Chief Complaint  Patient presents with  . Fall  . Knee Pain    Cheryl Glass is a 69 y.o. female.  Patient s/p slip and fall. Symptoms acute onset just pta today. Pt was walking towards neighbors, when slipped on a river rock, and fell with subsequent pain/swelling to right knee anteriorly. Pain constant, dull, moderate, worse w palpation/movement. Skin intact. Denies hip or ankle pain. No leg or foot numbness/weakness. Felt fine/asymptomatic prior to fall. No faintness or dizziness. No head injury, contusion or headache. No neck or back pain. No radicular pain. Pt denies any other pain or injury.   The history is provided by the patient and the EMS personnel.  Fall Pertinent negatives include no chest pain, no abdominal pain, no headaches and no shortness of breath.  Knee Pain Associated symptoms: no back pain, no fever and no neck pain        Past Medical History:  Diagnosis Date  . Eating disorder   . Hypertension   . Osteoporosis   . Wrist fracture 2013   right    There are no problems to display for this patient.   Past Surgical History:  Procedure Laterality Date  . AUGMENTATION MAMMAPLASTY    . BREAST ENHANCEMENT SURGERY    . CESAREAN SECTION    . HYSTEROSCOPY       OB History    Gravida  1   Para  1   Term  1   Preterm      AB      Living  1     SAB      TAB      Ectopic      Multiple      Live Births  1           Family History  Problem Relation Age of Onset  . Thyroid disease Sister        hypothyroid  . Osteoporosis Maternal Grandmother   . Cancer Father        pancreatic  . Diabetes Father     Social History   Tobacco Use  . Smoking status: Never Smoker  . Smokeless tobacco: Never Used  Substance Use Topics  . Alcohol use: No    Alcohol/week: 0.0 standard drinks  . Drug use: No     Home Medications Prior to Admission medications   Medication Sig Start Date End Date Taking? Authorizing Provider  atorvastatin (LIPITOR) 10 MG tablet Take 10 mg by mouth daily. 04/21/15   [provider]  lisinopril (PRINIVIL,ZESTRIL) 10 MG tablet Take 10 mg by mouth daily.    [provider]    Allergies    Patient has no known allergies.  Review of Systems   Review of Systems  Constitutional: Negative for fever.  HENT: Negative for nosebleeds.   Eyes: Negative for redness.  Respiratory: Negative for shortness of breath.   Cardiovascular: Negative for chest pain.  Gastrointestinal: Negative for abdominal pain and vomiting.  Genitourinary: Negative for flank pain.  Musculoskeletal: Negative for back pain and neck pain.  Skin: Negative for wound.  Neurological: Negative for numbness and headaches.  Hematological: Does not bruise/bleed easily.  Psychiatric/Behavioral: Negative for confusion.    Physical Exam Updated Vital Signs BP (!) 170/79 (BP Location: Right Arm)   Pulse 84   Temp 99.7 F (37.6 C) (Oral)  Resp 16   Ht 1.626 m (5\' 4" )   Wt 56.7 kg   LMP 07/10/2013   SpO2 100%   BMI 21.46 kg/m   Physical Exam Vitals and nursing note reviewed.  Constitutional:      Appearance: Normal appearance. She is well-developed.  HENT:     Head: Atraumatic.     Nose: Nose normal.     Mouth/Throat:     Mouth: Mucous membranes are moist.  Eyes:     General: No scleral icterus.    Conjunctiva/sclera: Conjunctivae normal.     Pupils: Pupils are equal, round, and reactive to light.  Neck:     Trachea: No tracheal deviation.  Cardiovascular:     Rate and Rhythm: Normal rate and regular rhythm.     Pulses: Normal pulses.     Heart sounds: Normal heart sounds. No murmur heard.  No friction rub. No gallop.   Pulmonary:     Effort: Pulmonary effort is normal. No respiratory distress.     Breath sounds: Normal breath sounds.  Abdominal:     General:  Bowel sounds are normal. There is no distension.     Palpations: Abdomen is soft.     Tenderness: There is no abdominal tenderness. There is no guarding.  Genitourinary:    Comments: No cva tenderness.  Musculoskeletal:        General: No swelling.     Cervical back: Normal range of motion and neck supple. No rigidity. No muscular tenderness.     Comments: C/T spine non tender, aligned, no step off. Good rom c spine without pain. Mild sts and tenderness right knee. Compartments of thigh/leg soft, not tense, no significant swelling noted. No other focal bony tenderness. Dp/pt 2+.  Skin:    General: Skin is warm and dry.     Findings: No rash.  Neurological:     Mental Status: She is alert.     Comments: Alert, speech normal. RLE motor/sens grossly intact.   Psychiatric:        Mood and Affect: Mood normal.     ED Results / Procedures / Treatments   Labs (all labs ordered are listed, but only abnormal results are displayed) Labs Reviewed - No data to display  EKG None   Radiology DG Knee Complete 4 Views Right  Result Date: 04/17/2020 CLINICAL DATA:  Right knee pain after fall EXAM: RIGHT KNEE - COMPLETE 4+ VIEW COMPARISON:  None. FINDINGS: Acute comminuted fracture of the mid patella with up to 2.5 cm of distraction of the fracture fragments. There is overlying soft tissue swelling. The visualized femur, tibia, and fibula appear intact. Tibiofemoral joint spaces are relatively preserved. Small joint effusion. IMPRESSION: Acute comminuted fracture of the mid patella with up to 2.5 cm of distraction of the fracture fragments. Electronically Signed   By: 04/19/2020 D.O.   On: 04/17/2020 16:40     Procedures Procedures (including critical care time)  Medications Ordered in ED Medications - No data to display  ED Course  I have reviewed the triage vital signs and the nursing notes.  Pertinent labs & imaging results that were available during my care of the patient were  reviewed by me and considered in my medical decision making (see chart for details).    MDM Rules/Calculators/A&P                          Xrays.    Icepack to knee.  Reviewed nursing notes and prior charts for additional history.   Xrays reviewed/interpreted by me - patella fx.   Knee immobilizer/crutches.   Orthopedics consulted - discussed pt, xrays - will f/u in office in next few days.   Recheck, distal pulses palp. Pain controlled (patient declines any pain medication in ED). No numbness/weakness. No other pain or c/o. Pt currently appears stable for d/c.   Return precautions provided.     Final Clinical Impression(s) / ED Diagnoses Final diagnoses:  None    Rx / DC Orders ED Discharge Orders    None       Cathren Laine, MD 04/17/20 1707

## 2020-04-17 NOTE — ED Notes (Signed)
Ice pack provided

## 2020-04-17 NOTE — ED Triage Notes (Signed)
BIB EMS fell on drive, deformity to rt knee. 150/p-80-98% -12

## 2020-04-17 NOTE — Discharge Instructions (Addendum)
It was our pleasure to provide your ER care today - we hope that you feel better.  Wear knee immobilizer. Ice/coldpack to sore area. Elevate knee.   Use crutches or walker.  Take acetaminophen as need. You may also take ultram as need for pain. No driving until cleared to do so by your orthopedist.   We discussed your case with our orthopedist, Dr Ophelia Charter. He indicates for you to follow up with him in the office tomorrow morning - call office at 8 AM to arrange appointment for you tomorrow - when you call, tell them that Dr Ophelia Charter wanted to see you in the office tomorrow, so that he could schedule you for surgery this Friday.   Follow up with your primary care doctor regarding your blood pressure which is high today.   Return to ER if worse, new symptoms, new or severe pain, numbness/weakness, or other concern.

## 2020-04-18 ENCOUNTER — Encounter: Payer: Self-pay | Admitting: Surgery

## 2020-04-18 ENCOUNTER — Other Ambulatory Visit (HOSPITAL_COMMUNITY)
Admission: RE | Admit: 2020-04-18 | Discharge: 2020-04-18 | Disposition: A | Discharge status: Home / Self Care | Payer: Medicare HMO | Source: Ambulatory Visit | Attending: Orthopaedic Surgery | Admitting: Orthopaedic Surgery

## 2020-04-18 ENCOUNTER — Other Ambulatory Visit: Payer: Self-pay

## 2020-04-18 ENCOUNTER — Ambulatory Visit: Payer: Medicare HMO | Admitting: Surgery

## 2020-04-18 VITALS — BP 151/91 | HR 112 | Ht 64.0 in | Wt 125.0 lb

## 2020-04-18 DIAGNOSIS — S82091A Other fracture of right patella, initial encounter for closed fracture: Secondary | ICD-10-CM | POA: Diagnosis not present

## 2020-04-18 DIAGNOSIS — Z20822 Contact with and (suspected) exposure to covid-19: Secondary | ICD-10-CM | POA: Insufficient documentation

## 2020-04-18 LAB — SARS CORONAVIRUS 2 (TAT 6-24 HRS): SARS Coronavirus 2: NEGATIVE

## 2020-04-18 NOTE — Progress Notes (Signed)
Office Visit Note   Patient: Cheryl Glass           Date of Birth: Feb 07, 1951           MRN: [id number] Visit Date: 04/18/2020              Requested by: Sigmund Hazel, MD [address] [place],  [place] [address] PCP: Sigmund Hazel, MD   Assessment & Plan: Visit Diagnoses:  1. Other closed fracture of right patella, initial encounter     Plan: Patient and her husband who was present advise today that best treatment option is surgical invention with ORIF patella fracture.  Surgery procedure discussed along potential rehab/recovery time.  Dr. Ophelia Charter also did speak with patient.  Surgery will be scheduled for this Friday.  Follow-Up Instructions: Return for rov one week postop with dr Ophelia Charter.   Orders:  No orders of the defined types were placed in this encounter.  No orders of the defined types were placed in this encounter.     Procedures: No procedures performed   Clinical Data: No additional findings.   Subjective: Chief Complaint  Patient presents with  . Right Knee - Fracture    HPI 69 year old white female was referred by the emergency room for treatment of right patella fracture.  Patient states that yesterday she was doing some landscaping in her yard when she slipped and fell onto a rock.  Was unable to ambulate after this occurred and EMS was called.  She was taken to the emergency room and x-ray showed a comminuted patella fracture with displacement of about 2.5 cm.  Patient was placed in a knee immobilizer and instructed be nonweightbearing.  She denies any other injuries or loss of consciousness at the time of her fall. Review of Systems No current cardiac pulmonary GI GU issues  Objective: Vital Signs: BP (!) 151/91   Pulse (!) 112   Ht 5\' 4"  (1.626 m)   Wt 125 lb (56.7 kg)   LMP 07/10/2013   BMI 21.46 kg/m   Physical Exam HENT:     Head: Normocephalic and atraumatic.  Eyes:     Extraocular Movements: Extraocular movements intact.      Pupils: Pupils are equal, round, and reactive to light.  Cardiovascular:     Heart sounds: Normal heart sounds.  Pulmonary:     Effort: No respiratory distress.  Musculoskeletal:        General: Swelling and tenderness present.     Comments: Calf is nontender  Skin:    General: Skin is warm.     Findings: Bruising present.  Neurological:     General: No focal deficit present.     Mental Status: She is alert and oriented to person, place, and time.  Psychiatric:        Mood and Affect: Mood normal.     Ortho Exam  Specialty Comments:  No specialty comments available.  Imaging: DG Knee Complete 4 Views Right  Result Date: 04/17/2020 CLINICAL DATA:  Right knee pain after fall EXAM: RIGHT KNEE - COMPLETE 4+ VIEW COMPARISON:  None. FINDINGS: Acute comminuted fracture of the mid patella with up to 2.5 cm of distraction of the fracture fragments. There is overlying soft tissue swelling. The visualized femur, tibia, and fibula appear intact. Tibiofemoral joint spaces are relatively preserved. Small joint effusion. IMPRESSION: Acute comminuted fracture of the mid patella with up to 2.5 cm of distraction of the fracture fragments. Electronically Signed   By: Duanne Guess  D.O.   On: 04/17/2020 16:40     PMFS History: There are no problems to display for this patient.  Past Medical History:  Diagnosis Date  . Eating disorder   . Hypertension   . Osteoporosis   . Wrist fracture 2013   right    Family History  Problem Relation Age of Onset  . Thyroid disease Sister        hypothyroid  . Osteoporosis Maternal Grandmother   . Cancer Father        pancreatic  . Diabetes Father     Past Surgical History:  Procedure Laterality Date  . AUGMENTATION MAMMAPLASTY    . BREAST ENHANCEMENT SURGERY    . CESAREAN SECTION    . HYSTEROSCOPY     Social History   Occupational History  . Not on file  Tobacco Use  . Smoking status: Never Smoker  . Smokeless tobacco: Never Used   Substance and Sexual Activity  . Alcohol use: No    Alcohol/week: 0.0 standard drinks  . Drug use: No  . Sexual activity: Yes    Partners: Male    Comment: husband vasectomy

## 2020-04-19 ENCOUNTER — Encounter (HOSPITAL_COMMUNITY): Payer: Self-pay | Admitting: Orthopaedic Surgery

## 2020-04-19 ENCOUNTER — Other Ambulatory Visit: Payer: Self-pay

## 2020-04-19 NOTE — H&P (Signed)
Office Visit Note   Patient: Cheryl Glass           Date of Birth: Feb 07, 1951           MRN: [id number] Visit Date: 04/18/2020              Requested by: Sigmund Hazel, MD [address] [place],  [place] [address] PCP: Sigmund Hazel, MD   Assessment & Plan: Visit Diagnoses:  1. Other closed fracture of right patella, initial encounter     Plan: Patient and her husband who was present advise today that best treatment option is surgical invention with ORIF patella fracture.  Surgery procedure discussed along potential rehab/recovery time.  Dr. Ophelia Charter also did speak with patient.  Surgery will be scheduled for this Friday.  Follow-Up Instructions: Return for rov one week postop with dr Ophelia Charter.   Orders:  No orders of the defined types were placed in this encounter.  No orders of the defined types were placed in this encounter.     Procedures: No procedures performed   Clinical Data: No additional findings.   Subjective: Chief Complaint  Patient presents with  . Right Knee - Fracture    HPI 69 year old white female was referred by the emergency room for treatment of right patella fracture.  Patient states that yesterday she was doing some landscaping in her yard when she slipped and fell onto a rock.  Was unable to ambulate after this occurred and EMS was called.  She was taken to the emergency room and x-ray showed a comminuted patella fracture with displacement of about 2.5 cm.  Patient was placed in a knee immobilizer and instructed be nonweightbearing.  She denies any other injuries or loss of consciousness at the time of her fall. Review of Systems No current cardiac pulmonary GI GU issues  Objective: Vital Signs: BP (!) 151/91   Pulse (!) 112   Ht 5\' 4"  (1.626 m)   Wt 125 lb (56.7 kg)   LMP 07/10/2013   BMI 21.46 kg/m   Physical Exam HENT:     Head: Normocephalic and atraumatic.  Eyes:     Extraocular Movements: Extraocular movements intact.      Pupils: Pupils are equal, round, and reactive to light.  Cardiovascular:     Heart sounds: Normal heart sounds.  Pulmonary:     Effort: No respiratory distress.  Musculoskeletal:        General: Swelling and tenderness present.     Comments: Calf is nontender  Skin:    General: Skin is warm.     Findings: Bruising present.  Neurological:     General: No focal deficit present.     Mental Status: She is alert and oriented to person, place, and time.  Psychiatric:        Mood and Affect: Mood normal.     Ortho Exam  Specialty Comments:  No specialty comments available.  Imaging: DG Knee Complete 4 Views Right  Result Date: 04/17/2020 CLINICAL DATA:  Right knee pain after fall EXAM: RIGHT KNEE - COMPLETE 4+ VIEW COMPARISON:  None. FINDINGS: Acute comminuted fracture of the mid patella with up to 2.5 cm of distraction of the fracture fragments. There is overlying soft tissue swelling. The visualized femur, tibia, and fibula appear intact. Tibiofemoral joint spaces are relatively preserved. Small joint effusion. IMPRESSION: Acute comminuted fracture of the mid patella with up to 2.5 cm of distraction of the fracture fragments. Electronically Signed   By: Duanne Guess  D.O.   On: 04/17/2020 16:40     PMFS History: There are no problems to display for this patient.  Past Medical History:  Diagnosis Date  . Eating disorder   . Hypertension   . Osteoporosis   . Wrist fracture 2013   right    Family History  Problem Relation Age of Onset  . Thyroid disease Sister        hypothyroid  . Osteoporosis Maternal Grandmother   . Cancer Father        pancreatic  . Diabetes Father     Past Surgical History:  Procedure Laterality Date  . AUGMENTATION MAMMAPLASTY    . BREAST ENHANCEMENT SURGERY    . CESAREAN SECTION    . HYSTEROSCOPY     Social History   Occupational History  . Not on file  Tobacco Use  . Smoking status: Never Smoker  . Smokeless tobacco: Never Used   Substance and Sexual Activity  . Alcohol use: No    Alcohol/week: 0.0 standard drinks  . Drug use: No  . Sexual activity: Yes    Partners: Male    Comment: husband vasectomy

## 2020-04-19 NOTE — Progress Notes (Signed)
Cheryl Glass denies chest pain or shortness of breath.  Patiwent tested negative for Covid on 04/18/20 and has been in quarantine with her husband since that time.

## 2020-04-20 ENCOUNTER — Encounter (HOSPITAL_COMMUNITY)
Admission: RE | Disposition: A | Discharge status: Home / Self Care | Payer: Self-pay | Source: Home / Self Care | Attending: Orthopaedic Surgery

## 2020-04-20 ENCOUNTER — Encounter (HOSPITAL_COMMUNITY): Payer: Self-pay | Admitting: Orthopaedic Surgery

## 2020-04-20 ENCOUNTER — Ambulatory Visit (HOSPITAL_COMMUNITY)
Admission: RE | Admit: 2020-04-20 | Discharge: 2020-04-20 | Disposition: A | Discharge status: Home / Self Care | Payer: Medicare HMO | Attending: Orthopaedic Surgery | Admitting: Orthopaedic Surgery

## 2020-04-20 ENCOUNTER — Ambulatory Visit (HOSPITAL_COMMUNITY): Payer: Medicare HMO

## 2020-04-20 ENCOUNTER — Ambulatory Visit (HOSPITAL_COMMUNITY): Payer: Medicare HMO | Admitting: Anesthesiology

## 2020-04-20 DIAGNOSIS — M1990 Unspecified osteoarthritis, unspecified site: Secondary | ICD-10-CM | POA: Insufficient documentation

## 2020-04-20 DIAGNOSIS — W01198A Fall on same level from slipping, tripping and stumbling with subsequent striking against other object, initial encounter: Secondary | ICD-10-CM | POA: Diagnosis not present

## 2020-04-20 DIAGNOSIS — Z419 Encounter for procedure for purposes other than remedying health state, unspecified: Secondary | ICD-10-CM

## 2020-04-20 DIAGNOSIS — S82001D Unspecified fracture of right patella, subsequent encounter for closed fracture with routine healing: Secondary | ICD-10-CM | POA: Diagnosis not present

## 2020-04-20 DIAGNOSIS — G8918 Other acute postprocedural pain: Secondary | ICD-10-CM | POA: Diagnosis not present

## 2020-04-20 DIAGNOSIS — Y93H2 Activity, gardening and landscaping: Secondary | ICD-10-CM | POA: Insufficient documentation

## 2020-04-20 DIAGNOSIS — I10 Essential (primary) hypertension: Secondary | ICD-10-CM | POA: Insufficient documentation

## 2020-04-20 DIAGNOSIS — M810 Age-related osteoporosis without current pathological fracture: Secondary | ICD-10-CM | POA: Insufficient documentation

## 2020-04-20 DIAGNOSIS — S82001A Unspecified fracture of right patella, initial encounter for closed fracture: Secondary | ICD-10-CM | POA: Diagnosis present

## 2020-04-20 DIAGNOSIS — S82041A Displaced comminuted fracture of right patella, initial encounter for closed fracture: Secondary | ICD-10-CM | POA: Diagnosis not present

## 2020-04-20 HISTORY — DX: Unspecified osteoarthritis, unspecified site: M19.90

## 2020-04-20 HISTORY — PX: ORIF PATELLA: SHX5033

## 2020-04-20 LAB — BASIC METABOLIC PANEL
Anion gap: 9 (ref 5–15)
BUN: 15 mg/dL (ref 8–23)
CO2: 22 mmol/L (ref 22–32)
Calcium: 9 mg/dL (ref 8.9–10.3)
Chloride: 110 mmol/L (ref 98–111)
Creatinine, Ser: 0.83 mg/dL (ref 0.44–1.00)
GFR calc Af Amer: >60 mL/min (ref >60)
GFR calc non Af Amer: >60 mL/min (ref >60)
Glucose, Bld: 110 mg/dL — ABNORMAL HIGH (ref 70–99)
Potassium: 3.5 mmol/L (ref 3.5–5.1)
Sodium: 141 mmol/L (ref 135–145)

## 2020-04-20 SURGERY — OPEN REDUCTION INTERNAL FIXATION (ORIF) PATELLA
Anesthesia: Spinal | Site: Knee | Laterality: Right

## 2020-04-20 MED ORDER — BUPIVACAINE LIPOSOME 1.3 % IJ SUSP
20.0000 mL | Freq: Once | INTRAMUSCULAR | Status: DC
  Filled 2020-04-20: qty 20

## 2020-04-20 MED ORDER — OXYCODONE HCL 5 MG PO TABS: 5.0000 mg | ORAL_TABLET | Freq: Once | ORAL | Status: AC | PRN

## 2020-04-20 MED ORDER — PROPOFOL 500 MG/50ML IV EMUL
INTRAVENOUS | Status: DC | PRN
  Administered 2020-04-20: 75 ug/kg/min via INTRAVENOUS

## 2020-04-20 MED ORDER — FENTANYL CITRATE (PF) 250 MCG/5ML IJ SOLN
INTRAMUSCULAR | Status: AC
  Filled 2020-04-20: qty 5

## 2020-04-20 MED ORDER — METHOCARBAMOL 500 MG PO TABS: 500.0000 mg | ORAL_TABLET | Freq: Four times a day (QID) | ORAL | 0 refills | Status: DC | PRN

## 2020-04-20 MED ORDER — OXYCODONE HCL 5 MG PO TABS
ORAL_TABLET | ORAL | Status: AC
  Administered 2020-04-20: 5 mg via ORAL
  Filled 2020-04-20: qty 1

## 2020-04-20 MED ORDER — FENTANYL CITRATE (PF) 100 MCG/2ML IJ SOLN: 50.0000 ug | Freq: Once | INTRAMUSCULAR | Status: AC

## 2020-04-20 MED ORDER — ONDANSETRON HCL 4 MG/2ML IJ SOLN
INTRAMUSCULAR | Status: AC
  Filled 2020-04-20: qty 2

## 2020-04-20 MED ORDER — CEFAZOLIN SODIUM-DEXTROSE 2-4 GM/100ML-% IV SOLN
INTRAVENOUS | Status: AC
  Filled 2020-04-20: qty 100

## 2020-04-20 MED ORDER — DEXAMETHASONE SODIUM PHOSPHATE 10 MG/ML IJ SOLN
INTRAMUSCULAR | Status: AC
  Filled 2020-04-20: qty 1

## 2020-04-20 MED ORDER — BUPIVACAINE HCL (PF) 0.25 % IJ SOLN
INTRAMUSCULAR | Status: AC
  Filled 2020-04-20: qty 10

## 2020-04-20 MED ORDER — PROMETHAZINE HCL 25 MG/ML IJ SOLN: 6.2500 mg | INTRAMUSCULAR | Status: DC | PRN

## 2020-04-20 MED ORDER — LACTATED RINGERS IV SOLN: INTRAVENOUS | Status: DC

## 2020-04-20 MED ORDER — PHENYLEPHRINE 40 MCG/ML (10ML) SYRINGE FOR IV PUSH (FOR BLOOD PRESSURE SUPPORT)
PREFILLED_SYRINGE | INTRAVENOUS | Status: DC | PRN
  Administered 2020-04-20 (×5): 80 ug via INTRAVENOUS

## 2020-04-20 MED ORDER — MIDAZOLAM HCL 2 MG/2ML IJ SOLN
INTRAMUSCULAR | Status: AC
  Filled 2020-04-20: qty 2

## 2020-04-20 MED ORDER — OXYCODONE-ACETAMINOPHEN 5-325 MG PO TABS: 1.0000 | ORAL_TABLET | ORAL | 0 refills | Status: DC | PRN

## 2020-04-20 MED ORDER — HYDROMORPHONE HCL 1 MG/ML IJ SOLN: 0.2500 mg | INTRAMUSCULAR | Status: DC | PRN

## 2020-04-20 MED ORDER — DEXAMETHASONE SODIUM PHOSPHATE 4 MG/ML IJ SOLN
INTRAMUSCULAR | Status: DC | PRN
  Administered 2020-04-20: 4 mg via INTRAVENOUS

## 2020-04-20 MED ORDER — CHLORHEXIDINE GLUCONATE 0.12 % MT SOLN
15.0000 mL | Freq: Once | OROMUCOSAL | Status: AC
  Administered 2020-04-20: 15 mL via OROMUCOSAL
  Filled 2020-04-20: qty 15

## 2020-04-20 MED ORDER — ONDANSETRON HCL 4 MG/2ML IJ SOLN
INTRAMUSCULAR | Status: DC | PRN
  Administered 2020-04-20: 4 mg via INTRAVENOUS

## 2020-04-20 MED ORDER — 0.9 % SODIUM CHLORIDE (POUR BTL) OPTIME
TOPICAL | Status: DC | PRN
  Administered 2020-04-20: 1000 mL

## 2020-04-20 MED ORDER — FENTANYL CITRATE (PF) 100 MCG/2ML IJ SOLN
INTRAMUSCULAR | Status: AC
  Administered 2020-04-20: 50 ug via INTRAVENOUS
  Filled 2020-04-20: qty 2

## 2020-04-20 MED ORDER — DEXAMETHASONE SODIUM PHOSPHATE 10 MG/ML IJ SOLN
INTRAMUSCULAR | Status: DC | PRN
  Administered 2020-04-20: 10 mg

## 2020-04-20 MED ORDER — PROPOFOL 10 MG/ML IV BOLUS
INTRAVENOUS | Status: DC | PRN
  Administered 2020-04-20: 30 mg via INTRAVENOUS
  Administered 2020-04-20 (×2): 20 mg via INTRAVENOUS

## 2020-04-20 MED ORDER — BUPIVACAINE HCL (PF) 0.25 % IJ SOLN
INTRAMUSCULAR | Status: DC | PRN
  Administered 2020-04-20: 10 mL

## 2020-04-20 MED ORDER — PROPOFOL 1000 MG/100ML IV EMUL
INTRAVENOUS | Status: AC
  Filled 2020-04-20: qty 100

## 2020-04-20 MED ORDER — ROPIVACAINE HCL 5 MG/ML IJ SOLN
INTRAMUSCULAR | Status: DC | PRN
  Administered 2020-04-20: 20 mL via PERINEURAL

## 2020-04-20 MED ORDER — LIDOCAINE 2% (20 MG/ML) 5 ML SYRINGE
INTRAMUSCULAR | Status: AC
  Filled 2020-04-20: qty 5

## 2020-04-20 MED ORDER — MIDAZOLAM HCL 2 MG/2ML IJ SOLN: 1.0000 mg | Freq: Once | INTRAMUSCULAR | Status: AC

## 2020-04-20 MED ORDER — MEPIVACAINE HCL (PF) 2 % IJ SOLN
INTRAMUSCULAR | Status: DC | PRN
Start: 2020-04-20 — End: 2020-04-20
  Administered 2020-04-20: 3 mL via INTRATHECAL

## 2020-04-20 MED ORDER — MIDAZOLAM HCL 2 MG/2ML IJ SOLN
INTRAMUSCULAR | Status: AC
  Administered 2020-04-20: 1 mg via INTRAVENOUS
  Filled 2020-04-20: qty 2

## 2020-04-20 MED ORDER — ORAL CARE MOUTH RINSE: 15.0000 mL | Freq: Once | OROMUCOSAL | Status: AC

## 2020-04-20 MED ORDER — PROPOFOL 10 MG/ML IV BOLUS
INTRAVENOUS | Status: AC
  Filled 2020-04-20: qty 20

## 2020-04-20 MED ORDER — CEFAZOLIN SODIUM-DEXTROSE 2-4 GM/100ML-% IV SOLN
2.0000 g | INTRAVENOUS | Status: AC
  Administered 2020-04-20: 2 g via INTRAVENOUS

## 2020-04-20 MED ORDER — FENTANYL CITRATE (PF) 100 MCG/2ML IJ SOLN
INTRAMUSCULAR | Status: DC | PRN
  Administered 2020-04-20: 50 ug via INTRAVENOUS

## 2020-04-20 MED ORDER — ASPIRIN EC 325 MG PO TBEC
325.0000 mg | DELAYED_RELEASE_TABLET | Freq: Every day | ORAL | 0 refills | Status: DC
Start: 2020-04-20 — End: 2020-06-01

## 2020-04-20 MED ORDER — KETOROLAC TROMETHAMINE 30 MG/ML IJ SOLN: 30.0000 mg | Freq: Once | INTRAMUSCULAR | Status: DC | PRN

## 2020-04-20 MED ORDER — OXYCODONE HCL 5 MG/5ML PO SOLN: 5.0000 mg | Freq: Once | ORAL | Status: AC | PRN

## 2020-04-20 MED ORDER — BUPIVACAINE LIPOSOME 1.3 % IJ SUSP
INTRAMUSCULAR | Status: DC | PRN
  Administered 2020-04-20: 10 mL

## 2020-04-20 SURGICAL SUPPLY — 81 items
APL SKNCLS STERI-STRIP NONHPOA (GAUZE/BANDAGES/DRESSINGS) ×1
BANDAGE ESMARK 6X9 LF (GAUZE/BANDAGES/DRESSINGS) ×1 IMPLANT
BENZOIN TINCTURE PRP APPL 2/3 (GAUZE/BANDAGES/DRESSINGS) ×3 IMPLANT
BLADE CLIPPER SURG (BLADE) ×3 IMPLANT
BNDG CMPR 9X6 STRL LF SNTH (GAUZE/BANDAGES/DRESSINGS) ×1
BNDG COHESIVE 4X5 TAN STRL (GAUZE/BANDAGES/DRESSINGS) ×3 IMPLANT
BNDG ELASTIC 4X5.8 VLCR STR LF (GAUZE/BANDAGES/DRESSINGS) ×3 IMPLANT
BNDG ELASTIC 6X5.8 VLCR STR LF (GAUZE/BANDAGES/DRESSINGS) ×3 IMPLANT
BNDG ESMARK 6X9 LF (GAUZE/BANDAGES/DRESSINGS) ×3
CANISTER SUCT 3000ML PPV (MISCELLANEOUS) ×3 IMPLANT
CLOSURE WOUND 1/2 X4 (GAUZE/BANDAGES/DRESSINGS) ×1
COVER SURGICAL LIGHT HANDLE (MISCELLANEOUS) ×3 IMPLANT
COVER WAND RF STERILE (DRAPES) ×3 IMPLANT
CUFF TOURN SGL QUICK 34 (TOURNIQUET CUFF)
CUFF TOURN SGL QUICK 42 (TOURNIQUET CUFF) ×2 IMPLANT
CUFF TRNQT CYL 34X4.125X (TOURNIQUET CUFF) IMPLANT
DRAPE C-ARM 42X72 X-RAY (DRAPES) ×3 IMPLANT
DRAPE C-ARMOR (DRAPES) ×1 IMPLANT
DRAPE INCISE IOBAN 66X45 STRL (DRAPES) ×3 IMPLANT
DRAPE U-SHAPE 47X51 STRL (DRAPES) ×3 IMPLANT
DRSG ADAPTIC 3X8 NADH LF (GAUZE/BANDAGES/DRESSINGS) ×3 IMPLANT
DRSG EMULSION OIL 3X3 NADH (GAUZE/BANDAGES/DRESSINGS) ×1 IMPLANT
DRSG MEPILEX BORDER 4X8 (GAUZE/BANDAGES/DRESSINGS) ×2 IMPLANT
DRSG PAD ABDOMINAL 8X10 ST (GAUZE/BANDAGES/DRESSINGS) ×3 IMPLANT
ELECT REM PT RETURN 9FT ADLT (ELECTROSURGICAL) ×3
ELECTRODE REM PT RTRN 9FT ADLT (ELECTROSURGICAL) ×1 IMPLANT
GAUZE SPONGE 4X4 12PLY STRL (GAUZE/BANDAGES/DRESSINGS) ×2 IMPLANT
GAUZE SPONGE 4X4 12PLY STRL LF (GAUZE/BANDAGES/DRESSINGS) ×3 IMPLANT
GAUZE XEROFORM 1X8 LF (GAUZE/BANDAGES/DRESSINGS) ×2 IMPLANT
GLOVE BIOGEL PI IND STRL 8 (GLOVE) ×2 IMPLANT
GLOVE BIOGEL PI INDICATOR 8 (GLOVE) ×4
GLOVE ORTHO TXT STRL SZ7.5 (GLOVE) ×6 IMPLANT
GOWN STRL REUS W/ TWL LRG LVL3 (GOWN DISPOSABLE) ×1 IMPLANT
GOWN STRL REUS W/ TWL XL LVL3 (GOWN DISPOSABLE) ×1 IMPLANT
GOWN STRL REUS W/TWL 2XL LVL3 (GOWN DISPOSABLE) ×3 IMPLANT
GOWN STRL REUS W/TWL LRG LVL3 (GOWN DISPOSABLE) ×3
GOWN STRL REUS W/TWL XL LVL3 (GOWN DISPOSABLE) ×3
IMMOBILIZER KNEE 24 THIGH 36 (MISCELLANEOUS) IMPLANT
IMMOBILIZER KNEE 24 UNIV (MISCELLANEOUS) ×3
KIT BASIN OR (CUSTOM PROCEDURE TRAY) ×3 IMPLANT
KIT TURNOVER KIT B (KITS) ×3 IMPLANT
NDL 1/2 CIR MAYO (NEEDLE) IMPLANT
NDL 18GX1X1/2 (RX/OR ONLY) (NEEDLE) IMPLANT
NDL HYPO 25GX1X1/2 BEV (NEEDLE) IMPLANT
NDL STRAIGHT KEITH (NEEDLE) IMPLANT
NEEDLE 1/2 CIR MAYO (NEEDLE) ×3 IMPLANT
NEEDLE 18GX1X1/2 (RX/OR ONLY) (NEEDLE) ×3 IMPLANT
NEEDLE 22X1 1/2 (OR ONLY) (NEEDLE) IMPLANT
NEEDLE HYPO 25GX1X1/2 BEV (NEEDLE) ×3 IMPLANT
NEEDLE STRAIGHT KEITH (NEEDLE) ×6 IMPLANT
NS IRRIG 1000ML POUR BTL (IV SOLUTION) ×3 IMPLANT
PACK ORTHO EXTREMITY (CUSTOM PROCEDURE TRAY) ×3 IMPLANT
PAD ARMBOARD 7.5X6 YLW CONV (MISCELLANEOUS) ×6 IMPLANT
PAD CAST 4YDX4 CTTN HI CHSV (CAST SUPPLIES) ×2 IMPLANT
PADDING CAST COTTON 4X4 STRL (CAST SUPPLIES) ×6
PADDING CAST COTTON 6X4 STRL (CAST SUPPLIES) ×2 IMPLANT
PASSER SUT SWANSON 36MM LOOP (INSTRUMENTS) ×2 IMPLANT
RETRIEVER SUT HEWSON (MISCELLANEOUS) ×4 IMPLANT
SPONGE LAP 4X18 RFD (DISPOSABLE) IMPLANT
STAPLER VISISTAT 35W (STAPLE) IMPLANT
STOCKINETTE IMPERVIOUS LG (DRAPES) ×3 IMPLANT
STRIP CLOSURE SKIN 1/2X4 (GAUZE/BANDAGES/DRESSINGS) ×2 IMPLANT
SUCTION FRAZIER HANDLE 10FR (MISCELLANEOUS) ×3
SUCTION TUBE FRAZIER 10FR DISP (MISCELLANEOUS) ×1 IMPLANT
SUT FIBERWIRE #2 38 REV NDL BL (SUTURE) ×12
SUT FIBERWIRE #2 38 T-5 BLUE (SUTURE) ×3
SUT VIC AB 0 CT1 27 (SUTURE) ×3
SUT VIC AB 0 CT1 27XBRD ANBCTR (SUTURE) ×1 IMPLANT
SUT VIC AB 2-0 CT1 27 (SUTURE) ×3
SUT VIC AB 2-0 CT1 TAPERPNT 27 (SUTURE) ×1 IMPLANT
SUT VIC AB 3-0 X1 27 (SUTURE) ×3 IMPLANT
SUTURE FIBERWR #2 38 T-5 BLUE (SUTURE) IMPLANT
SUTURE FIBERWR#2 38 REV NDL BL (SUTURE) IMPLANT
SYR 10ML LL (SYRINGE) ×2 IMPLANT
SYR CONTROL 10ML LL (SYRINGE) ×2 IMPLANT
TOWEL GREEN STERILE (TOWEL DISPOSABLE) ×3 IMPLANT
TUBE CONNECTING 12'X1/4 (SUCTIONS) ×1
TUBE CONNECTING 12X1/4 (SUCTIONS) ×2 IMPLANT
UNDERPAD 30X36 HEAVY ABSORB (UNDERPADS AND DIAPERS) ×3 IMPLANT
WATER STERILE IRR 1000ML POUR (IV SOLUTION) ×3 IMPLANT
YANKAUER SUCT BULB TIP NO VENT (SUCTIONS) IMPLANT

## 2020-04-20 NOTE — Interval H&P Note (Signed)
History and Physical Interval Note:  04/20/2020 9:28 AM  Cheryl Glass  has presented today for surgery, with the diagnosis of closed right patella fracture.  The various methods of treatment have been discussed with the patient and family. After consideration of risks, benefits and other options for treatment, the patient has consented to  Procedure(s): OPEN REDUCTION INTERNAL (ORIF) FIXATION RIGHT PATELLA (Right) as a surgical intervention.  The patient's history has been reviewed, patient examined, no change in status, stable for surgery.  I have reviewed the patient's chart and labs.  Questions were answered to the patient's satisfaction.     Eldred Manges

## 2020-04-20 NOTE — Evaluation (Signed)
Physical Therapy Evaluation Patient Details Name: Cheryl Glass MRN: [id number] DOB: [birth year] Today's Date: 04/20/2020   History of Present Illness  Pt is a 69 y.o. F with no significant PMH on file who presents with right patella fracture now s/p open reduction internal fixation.  Clinical Impression  Patient evaluated by Physical Therapy with no further acute PT needs identified. Pt hopping 50 feet with a walker at a min guard assist level. Initially, pt attempting to utilize touchdown weightbearing, but clarified weightbearing precautions and pt able to consistently adhere following. Education provided regarding no right knee ROM, use of knee immobilizer, elevation, cryotherapy. All education has been completed and the patient has no further questions. See below for any follow-up Physical Therapy or equipment needs. PT is signing off. Thank you for this referral.     Follow Up Recommendations No PT follow up;Supervision for mobility/OOB (will benefit from OPPT with WB status change)    Equipment Recommendations  Rolling walker with 5" wheels    Recommendations for Other Services       Precautions / Restrictions Precautions Precautions: Fall Required Braces or Orthoses: Knee Immobilizer - Right Knee Immobilizer - Right: On at all times Restrictions Weight Bearing Restrictions: Yes RLE Weight Bearing: Non weight bearing      Mobility  Bed Mobility Overal bed mobility: Modified Independent                Transfers Overall transfer level: Needs assistance Equipment used: Rolling walker (2 wheeled) Transfers: Sit to/from Stand Sit to Stand: Supervision         General transfer comment: Supervision for transition to standing  Ambulation/Gait Ambulation/Gait assistance: Min guard Gait Distance (Feet): 50 Feet Assistive device: Rolling walker (2 wheeled) Gait Pattern/deviations: Step-to pattern Gait velocity: decreased Gait velocity interpretation: <1.31  ft/sec, indicative of household ambulator General Gait Details: Hop to pattern, decreased right foot clearance, min guard for safety. Cues for weightbearing precautions.  Stairs            Wheelchair Mobility    Modified Rankin (Stroke Patients Only)       Balance Overall balance assessment: Needs assistance Sitting-balance support: Feet supported Sitting balance-Leahy Scale: Good     Standing balance support: Bilateral upper extremity supported Standing balance-Leahy Scale: Poor Standing balance comment: reliant on external support 2/2 NWB status                             Pertinent Vitals/Pain Pain Assessment: Faces Faces Pain Scale: Hurts a little bit Pain Location: R knee Pain Descriptors / Indicators: Guarding Pain Intervention(s): Monitored during session    Home Living Family/patient expects to be discharged to:: Private residence Living Arrangements: Spouse/significant other Available Help at Discharge: Family Type of Home: House Home Access: Other (comment) (small threshold)     Home Layout: One level Home Equipment: Crutches      Prior Function Level of Independence: Independent               Hand Dominance        Extremity/Trunk Assessment   Upper Extremity Assessment Upper Extremity Assessment: Overall WFL for tasks assessed    Lower Extremity Assessment Lower Extremity Assessment: RLE deficits/detail RLE Deficits / Details: R patella fx s/p ORIF. Able to perform SLR, ankle dorsiflexion/plantarflexion 5/5    Cervical / Trunk Assessment Cervical / Trunk Assessment: Normal  Communication   Communication: No difficulties  Cognition Arousal/Alertness: Awake/alert Behavior During Therapy: [facility]  for tasks assessed/performed Overall Cognitive Status: Within Functional Limits for tasks assessed                                        General Comments      Exercises     Assessment/Plan    PT  Assessment Patent does not need any further PT services  PT Problem List         PT Treatment Interventions      PT Goals (Current goals can be found in the Care Plan section)  Acute Rehab PT Goals Patient Stated Goal: return to independence PT Goal Formulation: All assessment and education complete, DC therapy    Frequency     Barriers to discharge        Co-evaluation               AM-PAC PT "6 Clicks" Mobility  Outcome Measure Help needed turning from your back to your side while in a flat bed without using bedrails?: None Help needed moving from lying on your back to sitting on the side of a flat bed without using bedrails?: None Help needed moving to and from a bed to a chair (including a wheelchair)?: None Help needed standing up from a chair using your arms (e.g., wheelchair or bedside chair)?: A Little Help needed to walk in hospital room?: A Little Help needed climbing 3-5 steps with a railing? : A Little 6 Click Score: 21    End of Session   Activity Tolerance: Patient tolerated treatment well Patient left: in bed;with call bell/phone within reach Nurse Communication: Mobility status PT Visit Diagnosis: Pain;Difficulty in walking, not elsewhere classified (R26.2) Pain - Right/Left: Right Pain - part of body: Knee    Time: 9562-1308 PT Time Calculation (min) (ACUTE ONLY): 21 min   Charges:   PT Evaluation $PT Eval Low Complexity: 1 Low            Lillia Pauls, PT, DPT Acute Rehabilitation Services Pager [phone] Office [phone]   Norval Morton 04/20/2020, 4:59 PM

## 2020-04-20 NOTE — Care Management (Signed)
ED CM received call concerning DME rolling walker for discharge. CM reviewed record for order and contacted [employer] with referral, walker will be delivered to patient prior to discharge.    Beecher Mcardle RN BSN NCM [phone]

## 2020-04-20 NOTE — Anesthesia Postprocedure Evaluation (Signed)
Anesthesia Post Note  Patient: Cheryl Glass  Procedure(s) Performed: OPEN REDUCTION INTERNAL (ORIF) FIXATION RIGHT PATELLA (Right Knee)     Patient location during evaluation: PACU Anesthesia Type: Spinal, Regional and MAC Level of consciousness: awake and alert Pain management: pain level controlled Vital Signs Assessment: post-procedure vital signs reviewed and stable Respiratory status: spontaneous breathing, nonlabored ventilation and respiratory function stable Cardiovascular status: blood pressure returned to baseline and stable Postop Assessment: no apparent nausea or vomiting, no headache, spinal receding and patient able to bend at knees Anesthetic complications: no   No complications documented.  Last Vitals:  Vitals:   04/20/20 1230 04/20/20 1235  BP:  (!) 153/77  Pulse: 73 83  Resp: 18 14  Temp:    SpO2: 99% 97%    Last Pain:  Vitals:   04/20/20 1235  TempSrc:   PainSc: 0-No pain    LLE Motor Response: Purposeful movement (04/20/20 1235) LLE Sensation: Decreased (04/20/20 1235) RLE Motor Response: Purposeful movement (04/20/20 1235) RLE Sensation: Decreased (04/20/20 1235) L Sensory Level: L4-Anterior knee, lower leg (04/20/20 1235) R Sensory Level: L4-Anterior knee, lower leg (04/20/20 1235)  Jarome Matin [place]

## 2020-04-20 NOTE — Discharge Instructions (Addendum)
Do not remove dressing or get wet.    Strict non-weightbearing left leg until further notice by Dr Ophelia Charter.     Elevate foot above heart level as much as possible to decrease swelling and pain.  Ice of and on prn.    Knee immobilizer on at all time.    Do not bend knee.    Elevate right foot above heart level as much as possible to help decrease pain and swelling.

## 2020-04-20 NOTE — Anesthesia Procedure Notes (Signed)
Spinal  Patient location during procedure: OR Start time: 04/20/2020 9:40 AM End time: 04/20/2020 9:48 AM Staffing Performed: anesthesiologist  Anesthesiologist: Lannie Fields, DO Preanesthetic Checklist Completed: patient identified, IV checked, risks and benefits discussed, surgical consent, monitors and equipment checked, pre-op evaluation and timeout performed Spinal Block Patient position: sitting Prep: DuraPrep and site prepped and draped Patient monitoring: cardiac monitor, continuous pulse ox and blood pressure Approach: midline Location: L3-4 Injection technique: single-shot Needle Needle type: Pencan  Needle gauge: 24 G Needle length: 9 cm Assessment Sensory level: T6 Additional Notes Functioning IV was confirmed and monitors were applied. Sterile prep and drape, including hand hygiene and sterile gloves were used. The patient was positioned and the spine was prepped. The skin was anesthetized with lidocaine.  Free flow of clear CSF was obtained prior to injecting local anesthetic into the CSF.  The spinal needle aspirated freely following injection.  The needle was carefully withdrawn.  The patient tolerated the procedure well.

## 2020-04-20 NOTE — Transfer of Care (Signed)
Immediate Anesthesia Transfer of Care Note  Patient: Cheryl Glass  Procedure(s) Performed: OPEN REDUCTION INTERNAL (ORIF) FIXATION RIGHT PATELLA (Right Knee)  Patient Location: PACU  Anesthesia Type:Spinal  Level of Consciousness: awake, alert  and oriented  Airway & Oxygen Therapy: Patient Spontanous Breathing  Post-op Assessment: Report given to RN and Post -op Vital signs reviewed and stable  Post vital signs: Reviewed and stable  Last Vitals:  Vitals Value Taken Time  BP 134/76 04/20/20 1206  Temp    Pulse 79 04/20/20 1209  Resp 25 04/20/20 1209  SpO2 98 % 04/20/20 1209  Vitals shown include unvalidated device data.  Last Pain:  Vitals:   04/20/20 1205  TempSrc:   PainSc: (P) 0-No pain      Patients Stated Pain Goal: 5 (04/20/20 0748)  Complications: No complications documented.

## 2020-04-20 NOTE — Anesthesia Preprocedure Evaluation (Addendum)
Anesthesia Evaluation  Patient identified by MRN, date of birth, ID band Patient awake    Reviewed: Allergy & Precautions, NPO status , Patient's Chart, lab work & pertinent test results  Airway Mallampati: I  TM Distance: >3 FB Neck ROM: Full  Mouth opening: Limited Mouth Opening  Dental  (+) Teeth Intact, Dental Advisory Given   Pulmonary neg pulmonary ROS,    Pulmonary exam normal breath sounds clear to auscultation       Cardiovascular hypertension, Normal cardiovascular exam Rhythm:Regular Rate:Normal     Neuro/Psych negative neurological ROS  negative psych ROS   GI/Hepatic negative GI ROS, Neg liver ROS,   Endo/Other  negative endocrine ROS  Renal/GU   negative genitourinary   Musculoskeletal  (+) Arthritis , Osteoarthritis,  Closed right patella fx   Abdominal   Peds  Hematology   Anesthesia Other Findings   Reproductive/Obstetrics                            Anesthesia Physical Anesthesia Plan  ASA: I  Anesthesia Plan: MAC, Spinal and Regional   Post-op Pain Management:  Regional for Post-op pain   Induction: Intravenous  PONV Risk Score and Plan: 2 and Ondansetron, Dexamethasone, Midazolam and Treatment may vary due to age or medical condition  Airway Management Planned: Natural Airway and Nasal Cannula  Additional Equipment: None  Intra-op Plan:   Post-operative Plan:   Informed Consent: I have reviewed the patients History and Physical, chart, labs and discussed the procedure including the risks, benefits and alternatives for the proposed anesthesia with the patient or authorized representative who has indicated his/her understanding and acceptance.       Plan Discussed with: CRNA  Anesthesia Plan Comments:         Anesthesia Quick Evaluation

## 2020-04-20 NOTE — Op Note (Signed)
Preop diagnosis comminuted inferior pole patella fracture.  Postop diagnosis same  Procedure open reduction internal fixation right patella fracture.  Surgeon: Annell Greening, MD  Anesthesia: Spinal close Exparel and Marcaine.  Assistant: Zonia Kief, PA-C medically necessary and present for the entire procedure.  Implants 1 stainless steel loop wire above the patella.  Multiple FiberWire sutures repaired through drill holes in the patella.  Procedure after induction of spinal anesthesia proximal thigh tourniquet standard prepping and draping impervious stockinette Coban extremity sheets and drapes were applied timeout procedure was completed Ancef was given 2 g prophylactically.  Midline incision was made.  Exposure the fracture showed abundant hematoma joint was evacuated.  Fracture was comminuted patient had significant osteoporosis and poor visualization on plain radiograph as well as fluoroscopic images due to poor bone quality.  There was insufficient piece of bone for stabilization with 2 screws and figure-of-eight wire.  Distal one fourth of the patella was in several fragments and was felt that better repaired be with drill holes placed through the patella.  The proximal portion of the patella was soft enough I did not think anchors placed in the patella would be adequate.  #2 FiberWire sutures were used in Bennell weave a total of 6 sutures were placed and K wires were drilled through the patella exiting on the proximal anterior surface of the patella and single strands were passed for the 2 Keith needles on each hand and then 2 sutures were passed through the remaining so that each suture was tied over bone proximally.  C-arm was brought in and after significant pressure was placed on a suture gradually pulling the 2 fragments together if pulled up pieces of bone of the distal patella with good restoration of the joint surface and reduction fragments.  All sutures were pulled down toward the  toes and then sequentially tied one after another maintaining tension to prevent displacement was checked under C arm.  Next using a free needle sutures were placed back down through the tendon for double row repair with sutures in an X configuration for additional securement.  To the patient's soft bone 18-gauge wire was then passed around the top of the patella through the quad tendon in place her drill hole in the tibial tubercle tightened down securely using the handle of a Cobb as a fulcrum tying a square knot.  Final spot pictures were taken showing good position of the patella good reduction.  Wire was cut copious irrigation.  Exparel and Marcaine was infiltrated temple standing was 20 for postoperative analgesia in addition to the femoral nerve block the patient had prior to be taken back to the operating room.  Postoperative dressing with web roll after dressing Ace wrap and the knee immobilizer was applied.

## 2020-04-20 NOTE — Addendum Note (Signed)
Addendum  created 04/20/20 1344 by Lannie Fields, DO   Order list changed

## 2020-04-20 NOTE — Anesthesia Procedure Notes (Signed)
Anesthesia Regional Block: Adductor canal block   Pre-Anesthetic Checklist: ,, timeout performed, Correct Patient, Correct Site, Correct Laterality, Correct Procedure, Correct Position, site marked, Risks and benefits discussed,  Surgical consent,  Pre-op evaluation,  At surgeon's request and post-op pain management  Laterality: Right  Prep: Maximum Sterile Barrier Precautions used, chloraprep       Needles:  Injection technique: Single-shot  Needle Type: Echogenic Stimulator Needle     Needle Length: 9cm  Needle Gauge: 22     Additional Needles:   Procedures:,,,, ultrasound used (permanent image in chart),,,,  Narrative:  Start time: 04/20/2020 9:00 AM End time: 04/20/2020 9:10 AM Injection made incrementally with aspirations every 5 mL.  Performed by: Personally  Anesthesiologist: Lannie Fields, DO  Additional Notes: Monitors applied. No increased pain on injection. No increased resistance to injection. Injection made in 5cc increments. Good needle visualization. Patient tolerated procedure well.

## 2020-04-20 NOTE — Anesthesia Procedure Notes (Signed)
Procedure Name: MAC Date/Time: 04/20/2020 9:50 AM Performed by: Jenne Campus, CRNA Pre-anesthesia Checklist: Patient identified, Emergency Drugs available, Suction available and Patient being monitored Oxygen Delivery Method: Simple face mask

## 2020-04-23 ENCOUNTER — Encounter (HOSPITAL_COMMUNITY): Payer: Self-pay | Admitting: Orthopaedic Surgery

## 2020-04-23 DIAGNOSIS — R531 Weakness: Secondary | ICD-10-CM | POA: Diagnosis not present

## 2020-04-23 DIAGNOSIS — S82001A Unspecified fracture of right patella, initial encounter for closed fracture: Secondary | ICD-10-CM | POA: Diagnosis not present

## 2020-05-01 ENCOUNTER — Telehealth: Payer: Self-pay | Admitting: Orthopaedic Surgery

## 2020-05-01 NOTE — Telephone Encounter (Signed)
Patient called to say she has an appointment tomorrow at 10am  (with Fayrene Fearing & Dr. Ophelia Charter) and will need a wheelchair to get from her car into the building and to the exam room. I suggested the patient call us when she arrives in the parking lot or have her husband come in and request a wheel chair and a clinic staff person to assist.     Pt's cb  [phone]

## 2020-05-01 NOTE — Telephone Encounter (Signed)
noted 

## 2020-05-02 ENCOUNTER — Ambulatory Visit: Payer: Self-pay

## 2020-05-02 ENCOUNTER — Encounter: Payer: Self-pay | Admitting: Surgery

## 2020-05-02 ENCOUNTER — Ambulatory Visit (INDEPENDENT_AMBULATORY_CARE_PROVIDER_SITE_OTHER): Payer: Medicare HMO | Admitting: Surgery

## 2020-05-02 VITALS — BP 123/80 | HR 105 | Ht 64.0 in | Wt 125.0 lb

## 2020-05-02 DIAGNOSIS — S82091A Other fracture of right patella, initial encounter for closed fracture: Secondary | ICD-10-CM

## 2020-05-02 NOTE — Progress Notes (Signed)
69 year old white female who is about 10 days status post ORIF right patella fracture returns.  States that she is doing well.  Not taking any pain medication currently.  Has been compliant with postop instructions with knee immobilizer and weightbearing restrictions.  Exam Very pleasant female alert and oriented in no acute distress.  Not much by the way of knee swelling.  Calf is nontender.  Wound looks good.  Staples intact.  No drainage or signs of infection.   Plan Patient continue knee immobilizer at all times.  Nonweightbearing.  Continue leg elevation.  Follow-up next Tuesday for wound check and likely staple removal.  All questions answered.

## 2020-05-08 ENCOUNTER — Other Ambulatory Visit: Payer: Self-pay

## 2020-05-08 ENCOUNTER — Ambulatory Visit (INDEPENDENT_AMBULATORY_CARE_PROVIDER_SITE_OTHER): Payer: Medicare HMO | Admitting: Orthopaedic Surgery

## 2020-05-08 VITALS — BP 123/75 | HR 84 | Ht 64.0 in | Wt 125.0 lb

## 2020-05-08 DIAGNOSIS — S82041D Displaced comminuted fracture of right patella, subsequent encounter for closed fracture with routine healing: Secondary | ICD-10-CM

## 2020-05-08 NOTE — Progress Notes (Signed)
Post-Op Visit Note   Patient: Cheryl Glass           Date of Birth: 09/28/1951           MRN: [id number] Visit Date: 05/08/2020 PCP: Sigmund Hazel, MD   Assessment & Plan: Post-ORIF inferior pole patella fracture.  She has a protective wire.  She was repaired with multiple drill holes made with Mellody Dance needles through bone.  She is on 1 aspirin a day for 1 month to help prevent blood clots.  Swelling is down.  Last x-ray showed good position.  Staples are harvested today tincture benzoin Steri-Strips applied.  She will return in 3 weeks we will repeat lateral x-ray of her knee.  She will be scheduled the following week for wire removal so she can start some range of motion.  Chief Complaint:  Chief Complaint  Patient presents with  . Right Knee - Routine Post Op   Visit Diagnoses:  1. Closed displaced comminuted fracture of right patella with routine healing, subsequent encounter     Plan: ROV 3 wks  Follow-Up Instructions: Return in about 3 weeks (around 05/29/2020).   Orders:  No orders of the defined types were placed in this encounter.  No orders of the defined types were placed in this encounter.   Imaging: No results found.  PMFS History: Patient Active Problem List   Diagnosis Date Noted  . Right patella fracture 04/20/2020   Past Medical History:  Diagnosis Date  . Arthritis   . Eating disorder   . Hypertension   . Osteoporosis   . Wrist fracture 2013   right    Family History  Problem Relation Age of Onset  . Thyroid disease Sister        hypothyroid  . Osteoporosis Maternal Grandmother   . Cancer Father        pancreatic  . Diabetes Father     Past Surgical History:  Procedure Laterality Date  . AUGMENTATION MAMMAPLASTY    . BREAST ENHANCEMENT SURGERY    . CESAREAN SECTION    . HYSTEROSCOPY    . ORIF PATELLA Right 04/20/2020   Procedure: OPEN REDUCTION INTERNAL (ORIF) FIXATION RIGHT PATELLA;  Surgeon: Eldred Manges, MD;  Location: [facility] OR;   Service: Orthopedics;  Laterality: Right;   Social History   Occupational History  . Not on file  Tobacco Use  . Smoking status: Never Smoker  . Smokeless tobacco: Never Used  Vaping Use  . Vaping Use: Never used  Substance and Sexual Activity  . Alcohol use: No    Alcohol/week: 0.0 standard drinks  . Drug use: No  . Sexual activity: Yes    Partners: Male    Comment: husband vasectomy

## 2020-05-29 ENCOUNTER — Encounter: Payer: Self-pay | Admitting: Orthopaedic Surgery

## 2020-05-29 ENCOUNTER — Ambulatory Visit (INDEPENDENT_AMBULATORY_CARE_PROVIDER_SITE_OTHER): Payer: Medicare HMO | Admitting: Orthopaedic Surgery

## 2020-05-29 ENCOUNTER — Ambulatory Visit: Payer: Self-pay

## 2020-05-29 VITALS — Ht 64.0 in | Wt 125.0 lb

## 2020-05-29 DIAGNOSIS — S82041D Displaced comminuted fracture of right patella, subsequent encounter for closed fracture with routine healing: Secondary | ICD-10-CM

## 2020-05-29 NOTE — Progress Notes (Signed)
Post-Op Visit Note   Patient: Cheryl Glass           Date of Birth: Oct 23, 1950           MRN: [id number] Visit Date: 05/29/2020 PCP: Sigmund Hazel, MD   Assessment & Plan: Post-ORIF patella with the cerclage wire through the tibial tubercle.  Incision is healed she is ready for wire removal we will set this up as an outpatient under LMA and some local with mini C arm localization of the wire.  Chief Complaint:  Chief Complaint  Patient presents with  . Right Knee - Follow-up    04/20/2020 ORIF Right patella   Visit Diagnoses:  1. Closed displaced comminuted fracture of right patella with routine healing, subsequent encounter     Plan: wire removal as outpt. She can start knee ROM after wire removal with PT.   Follow-Up Instructions: No follow-ups on file.   Orders:  Orders Placed This Encounter  Procedures  . XR Knee 1-2 Views Right   No orders of the defined types were placed in this encounter.   Imaging: No results found.  PMFS History: Patient Active Problem List   Diagnosis Date Noted  . Right patella fracture 04/20/2020   Past Medical History:  Diagnosis Date  . Arthritis   . Eating disorder   . Hypertension   . Osteoporosis   . Wrist fracture 2013   right    Family History  Problem Relation Age of Onset  . Thyroid disease Sister        hypothyroid  . Osteoporosis Maternal Grandmother   . Cancer Father        pancreatic  . Diabetes Father     Past Surgical History:  Procedure Laterality Date  . AUGMENTATION MAMMAPLASTY    . BREAST ENHANCEMENT SURGERY    . CESAREAN SECTION    . HYSTEROSCOPY    . ORIF PATELLA Right 04/20/2020   Procedure: OPEN REDUCTION INTERNAL (ORIF) FIXATION RIGHT PATELLA;  Surgeon: Eldred Manges, MD;  Location: [facility] OR;  Service: Orthopedics;  Laterality: Right;   Social History   Occupational History  . Not on file  Tobacco Use  . Smoking status: Never Smoker  . Smokeless tobacco: Never Used  Vaping Use  . Vaping  Use: Never used  Substance and Sexual Activity  . Alcohol use: No    Alcohol/week: 0.0 standard drinks  . Drug use: No  . Sexual activity: Yes    Partners: Male    Comment: husband vasectomy

## 2020-06-01 ENCOUNTER — Other Ambulatory Visit: Payer: Self-pay

## 2020-06-01 ENCOUNTER — Encounter (HOSPITAL_BASED_OUTPATIENT_CLINIC_OR_DEPARTMENT_OTHER): Payer: Self-pay | Admitting: Orthopaedic Surgery

## 2020-06-02 ENCOUNTER — Other Ambulatory Visit (HOSPITAL_COMMUNITY)
Admission: RE | Admit: 2020-06-02 | Discharge: 2020-06-02 | Disposition: A | Discharge status: Home / Self Care | Payer: Medicare HMO | Source: Ambulatory Visit | Attending: Orthopaedic Surgery | Admitting: Orthopaedic Surgery

## 2020-06-02 DIAGNOSIS — Z20822 Contact with and (suspected) exposure to covid-19: Secondary | ICD-10-CM | POA: Insufficient documentation

## 2020-06-02 DIAGNOSIS — Z01812 Encounter for preprocedural laboratory examination: Secondary | ICD-10-CM | POA: Diagnosis not present

## 2020-06-02 LAB — SARS CORONAVIRUS 2 (TAT 6-24 HRS): SARS Coronavirus 2: NEGATIVE

## 2020-06-06 ENCOUNTER — Ambulatory Visit (HOSPITAL_BASED_OUTPATIENT_CLINIC_OR_DEPARTMENT_OTHER): Payer: Medicare HMO | Admitting: Anesthesiology

## 2020-06-06 ENCOUNTER — Telehealth: Payer: Self-pay | Admitting: Physical Medicine and Rehabilitation

## 2020-06-06 ENCOUNTER — Encounter (HOSPITAL_BASED_OUTPATIENT_CLINIC_OR_DEPARTMENT_OTHER)
Admission: RE | Disposition: A | Discharge status: Home / Self Care | Payer: Self-pay | Source: Home / Self Care | Attending: Orthopaedic Surgery

## 2020-06-06 ENCOUNTER — Telehealth: Payer: Self-pay | Admitting: Orthopaedic Surgery

## 2020-06-06 ENCOUNTER — Other Ambulatory Visit: Payer: Self-pay

## 2020-06-06 ENCOUNTER — Encounter (HOSPITAL_BASED_OUTPATIENT_CLINIC_OR_DEPARTMENT_OTHER): Payer: Self-pay | Admitting: Orthopaedic Surgery

## 2020-06-06 ENCOUNTER — Ambulatory Visit (HOSPITAL_BASED_OUTPATIENT_CLINIC_OR_DEPARTMENT_OTHER)
Admission: RE | Admit: 2020-06-06 | Discharge: 2020-06-06 | Disposition: A | Discharge status: Home / Self Care | Payer: Medicare HMO | Attending: Orthopaedic Surgery | Admitting: Orthopaedic Surgery

## 2020-06-06 DIAGNOSIS — X58XXXD Exposure to other specified factors, subsequent encounter: Secondary | ICD-10-CM | POA: Insufficient documentation

## 2020-06-06 DIAGNOSIS — S82001D Unspecified fracture of right patella, subsequent encounter for closed fracture with routine healing: Secondary | ICD-10-CM | POA: Insufficient documentation

## 2020-06-06 DIAGNOSIS — S82041D Displaced comminuted fracture of right patella, subsequent encounter for closed fracture with routine healing: Secondary | ICD-10-CM

## 2020-06-06 DIAGNOSIS — S82032G Displaced transverse fracture of left patella, subsequent encounter for closed fracture with delayed healing: Secondary | ICD-10-CM

## 2020-06-06 DIAGNOSIS — Z472 Encounter for removal of internal fixation device: Secondary | ICD-10-CM | POA: Diagnosis not present

## 2020-06-06 DIAGNOSIS — I10 Essential (primary) hypertension: Secondary | ICD-10-CM | POA: Diagnosis not present

## 2020-06-06 DIAGNOSIS — M1990 Unspecified osteoarthritis, unspecified site: Secondary | ICD-10-CM | POA: Diagnosis not present

## 2020-06-06 HISTORY — PX: HARDWARE REMOVAL: SHX979

## 2020-06-06 HISTORY — DX: Hyperlipidemia, unspecified: E78.5

## 2020-06-06 HISTORY — PX: EXAM UNDER ANESTHESIA WITH MANIPULATION OF KNEE: SHX5816

## 2020-06-06 HISTORY — DX: Unspecified fracture of right patella, initial encounter for closed fracture: S82.001A

## 2020-06-06 SURGERY — REMOVAL, HARDWARE
Anesthesia: General | Site: Knee | Laterality: Right

## 2020-06-06 MED ORDER — SCOPOLAMINE 1 MG/3DAYS TD PT72: 1.0000 | MEDICATED_PATCH | TRANSDERMAL | Status: DC

## 2020-06-06 MED ORDER — FENTANYL CITRATE (PF) 100 MCG/2ML IJ SOLN: 25.0000 ug | INTRAMUSCULAR | Status: DC | PRN

## 2020-06-06 MED ORDER — PHENYLEPHRINE 40 MCG/ML (10ML) SYRINGE FOR IV PUSH (FOR BLOOD PRESSURE SUPPORT)
PREFILLED_SYRINGE | INTRAVENOUS | Status: AC
  Filled 2020-06-06: qty 10

## 2020-06-06 MED ORDER — PROMETHAZINE HCL 25 MG/ML IJ SOLN: 6.2500 mg | INTRAMUSCULAR | Status: DC | PRN

## 2020-06-06 MED ORDER — MIDAZOLAM HCL 2 MG/2ML IJ SOLN
INTRAMUSCULAR | Status: AC
  Filled 2020-06-06: qty 2

## 2020-06-06 MED ORDER — SUCCINYLCHOLINE CHLORIDE 200 MG/10ML IV SOSY
PREFILLED_SYRINGE | INTRAVENOUS | Status: AC
  Filled 2020-06-06: qty 10

## 2020-06-06 MED ORDER — DIPHENHYDRAMINE HCL 50 MG/ML IJ SOLN
INTRAMUSCULAR | Status: DC | PRN
  Administered 2020-06-06: 6.25 mg via INTRAVENOUS

## 2020-06-06 MED ORDER — FENTANYL CITRATE (PF) 100 MCG/2ML IJ SOLN
INTRAMUSCULAR | Status: DC | PRN
Start: 2020-06-06 — End: 2020-06-06
  Administered 2020-06-06 (×2): 50 ug via INTRAVENOUS

## 2020-06-06 MED ORDER — DIPHENHYDRAMINE HCL 50 MG/ML IJ SOLN
INTRAMUSCULAR | Status: AC
  Filled 2020-06-06: qty 1

## 2020-06-06 MED ORDER — PROPOFOL 500 MG/50ML IV EMUL
INTRAVENOUS | Status: AC
  Filled 2020-06-06: qty 50

## 2020-06-06 MED ORDER — MIDAZOLAM HCL 5 MG/5ML IJ SOLN
INTRAMUSCULAR | Status: DC | PRN
  Administered 2020-06-06: 1 mg via INTRAVENOUS

## 2020-06-06 MED ORDER — LIDOCAINE 2% (20 MG/ML) 5 ML SYRINGE
INTRAMUSCULAR | Status: AC
  Filled 2020-06-06: qty 5

## 2020-06-06 MED ORDER — FENTANYL CITRATE (PF) 100 MCG/2ML IJ SOLN
INTRAMUSCULAR | Status: AC
  Filled 2020-06-06: qty 2

## 2020-06-06 MED ORDER — PROPOFOL 10 MG/ML IV BOLUS
INTRAVENOUS | Status: DC | PRN
  Administered 2020-06-06: 200 mg via INTRAVENOUS

## 2020-06-06 MED ORDER — MIDAZOLAM HCL 2 MG/2ML IJ SOLN: 0.5000 mg | Freq: Once | INTRAMUSCULAR | Status: DC | PRN

## 2020-06-06 MED ORDER — LACTATED RINGERS IV SOLN: INTRAVENOUS | Status: DC | PRN

## 2020-06-06 MED ORDER — LIDOCAINE 2% (20 MG/ML) 5 ML SYRINGE
INTRAMUSCULAR | Status: DC | PRN
  Administered 2020-06-06: 40 mg via INTRAVENOUS

## 2020-06-06 MED ORDER — CEFAZOLIN SODIUM-DEXTROSE 2-4 GM/100ML-% IV SOLN
INTRAVENOUS | Status: AC
  Filled 2020-06-06: qty 100

## 2020-06-06 MED ORDER — DEXAMETHASONE SODIUM PHOSPHATE 10 MG/ML IJ SOLN
INTRAMUSCULAR | Status: AC
  Filled 2020-06-06: qty 1

## 2020-06-06 MED ORDER — DEXAMETHASONE SODIUM PHOSPHATE 10 MG/ML IJ SOLN
INTRAMUSCULAR | Status: DC | PRN
  Administered 2020-06-06: 5 mg via INTRAVENOUS

## 2020-06-06 MED ORDER — EPHEDRINE 5 MG/ML INJ
INTRAVENOUS | Status: AC
  Filled 2020-06-06: qty 10

## 2020-06-06 MED ORDER — ONDANSETRON HCL 4 MG/2ML IJ SOLN
INTRAMUSCULAR | Status: DC | PRN
  Administered 2020-06-06: 4 mg via INTRAVENOUS

## 2020-06-06 MED ORDER — ONDANSETRON HCL 4 MG/2ML IJ SOLN
INTRAMUSCULAR | Status: AC
  Filled 2020-06-06: qty 2

## 2020-06-06 MED ORDER — MEPERIDINE HCL 25 MG/ML IJ SOLN: 6.2500 mg | INTRAMUSCULAR | Status: DC | PRN

## 2020-06-06 MED ORDER — ACETAMINOPHEN 500 MG PO TABS: 1000.0000 mg | ORAL_TABLET | Freq: Once | ORAL | Status: DC

## 2020-06-06 MED ORDER — BUPIVACAINE HCL (PF) 0.25 % IJ SOLN
INTRAMUSCULAR | Status: DC | PRN
  Administered 2020-06-06: 3 mL

## 2020-06-06 MED ORDER — CEFAZOLIN SODIUM-DEXTROSE 2-4 GM/100ML-% IV SOLN
2.0000 g | INTRAVENOUS | Status: AC
  Administered 2020-06-06: 2 g via INTRAVENOUS

## 2020-06-06 SURGICAL SUPPLY — 51 items
BANDAGE ESMARK 6X9 LF (GAUZE/BANDAGES/DRESSINGS) IMPLANT
BLADE SURG 15 STRL LF DISP TIS (BLADE) ×1 IMPLANT
BLADE SURG 15 STRL SS (BLADE) ×3
BNDG CMPR 9X4 STRL LF SNTH (GAUZE/BANDAGES/DRESSINGS)
BNDG CMPR 9X6 STRL LF SNTH (GAUZE/BANDAGES/DRESSINGS)
BNDG CONFORM 3 STRL LF (GAUZE/BANDAGES/DRESSINGS) ×1 IMPLANT
BNDG ELASTIC 4X5.8 VLCR STR LF (GAUZE/BANDAGES/DRESSINGS) ×3 IMPLANT
BNDG ESMARK 4X9 LF (GAUZE/BANDAGES/DRESSINGS) IMPLANT
BNDG ESMARK 6X9 LF (GAUZE/BANDAGES/DRESSINGS)
BRUSH SCRUB EZ PLAIN DRY (MISCELLANEOUS) ×1 IMPLANT
CORD BIPOLAR FORCEPS 12FT (ELECTRODE) IMPLANT
COVER BACK TABLE 60X90IN (DRAPES) ×1 IMPLANT
COVER MAYO STAND STRL (DRAPES) IMPLANT
COVER WAND RF STERILE (DRAPES) IMPLANT
DECANTER SPIKE VIAL GLASS SM (MISCELLANEOUS) IMPLANT
DRAPE C-ARM 42X72 X-RAY (DRAPES) IMPLANT
DRAPE EXTREMITY T 121X128X90 (DISPOSABLE) ×3 IMPLANT
DRAPE OEC MINIVIEW 54X84 (DRAPES) ×2 IMPLANT
ELECT REM PT RETURN 9FT ADLT (ELECTROSURGICAL) ×3
ELECTRODE REM PT RTRN 9FT ADLT (ELECTROSURGICAL) ×1 IMPLANT
GAUZE XEROFORM 1X8 LF (GAUZE/BANDAGES/DRESSINGS) ×3 IMPLANT
GLOVE BIO SURGEON STRL SZ7.5 (GLOVE) ×3 IMPLANT
GLOVE BIOGEL PI IND STRL 8 (GLOVE) ×1 IMPLANT
GLOVE BIOGEL PI INDICATOR 8 (GLOVE) ×2
GOWN STRL REUS W/ TWL LRG LVL3 (GOWN DISPOSABLE) ×1 IMPLANT
GOWN STRL REUS W/ TWL XL LVL3 (GOWN DISPOSABLE) ×1 IMPLANT
GOWN STRL REUS W/TWL LRG LVL3 (GOWN DISPOSABLE) ×3
GOWN STRL REUS W/TWL XL LVL3 (GOWN DISPOSABLE) ×3
NDL HYPO 25X1 1.5 SAFETY (NEEDLE) IMPLANT
NEEDLE HYPO 25X1 1.5 SAFETY (NEEDLE) IMPLANT
NS IRRIG 1000ML POUR BTL (IV SOLUTION) ×3 IMPLANT
PACK ARTHROSCOPY DSU (CUSTOM PROCEDURE TRAY) ×3 IMPLANT
PACK BASIN DAY SURGERY FS (CUSTOM PROCEDURE TRAY) ×3 IMPLANT
PAD CAST 3X4 CTTN HI CHSV (CAST SUPPLIES) ×1 IMPLANT
PADDING CAST ABS 4INX4YD NS (CAST SUPPLIES)
PADDING CAST ABS COTTON 4X4 ST (CAST SUPPLIES) ×1 IMPLANT
PADDING CAST COTTON 3X4 STRL (CAST SUPPLIES)
PENCIL SMOKE EVACUATOR (MISCELLANEOUS) ×3 IMPLANT
SHEET MEDIUM DRAPE 40X70 STRL (DRAPES) IMPLANT
SPLINT PLASTER CAST XFAST 3X15 (CAST SUPPLIES) IMPLANT
SPLINT PLASTER XTRA FASTSET 3X (CAST SUPPLIES)
STOCKINETTE 4X48 STRL (DRAPES) IMPLANT
SUCTION FRAZIER HANDLE 10FR (MISCELLANEOUS)
SUCTION TUBE FRAZIER 10FR DISP (MISCELLANEOUS) IMPLANT
SUT ETHILON 4 0 PS 2 18 (SUTURE) ×4 IMPLANT
SUT ETHILON 5 0 PS 2 18 (SUTURE) ×3 IMPLANT
SUT ETHILON 6 0 P 1 (SUTURE) ×1 IMPLANT
SYR BULB EAR ULCER 3OZ GRN STR (SYRINGE) IMPLANT
TOWEL GREEN STERILE FF (TOWEL DISPOSABLE) ×6 IMPLANT
UNDERPAD 30X36 HEAVY ABSORB (UNDERPADS AND DIAPERS) ×3 IMPLANT
YANKAUER SUCT BULB TIP NO VENT (SUCTIONS) ×1 IMPLANT

## 2020-06-06 NOTE — Telephone Encounter (Signed)
Patient called.   She is requesting a call back to check the status on her referral to a rehab facility.   Call back: [phone]

## 2020-06-06 NOTE — Anesthesia Postprocedure Evaluation (Signed)
Anesthesia Post Note  Patient: Cheryl Glass  Procedure(s) Performed: right knee wire removal (Right Knee) EXAM UNDER ANESTHESIA WITH MANIPULATION OF KNEE (Right Knee)     Patient location during evaluation: PACU Anesthesia Type: General Level of consciousness: awake and alert, patient cooperative and oriented Pain management: pain level controlled Vital Signs Assessment: post-procedure vital signs reviewed and stable Respiratory status: spontaneous breathing, nonlabored ventilation and respiratory function stable Cardiovascular status: blood pressure returned to baseline and stable Postop Assessment: no apparent nausea or vomiting and adequate PO intake Anesthetic complications: no   No complications documented.  Last Vitals:  Vitals:   06/06/20 1345 06/06/20 1415  BP: (!) 158/99 (!) 162/81  Pulse: 71 67  Resp: (!) 21 17  Temp:  37 C  SpO2: 100% 100%    Last Pain:  Vitals:   06/06/20 1415  TempSrc:   PainSc: 2                  Cheryl Glass,E. Alvino Lechuga

## 2020-06-06 NOTE — Telephone Encounter (Signed)
Made in error

## 2020-06-06 NOTE — Progress Notes (Signed)
1324 Oral airway removed by CRNA just after arrival to PACU.

## 2020-06-06 NOTE — Anesthesia Preprocedure Evaluation (Addendum)
Anesthesia Evaluation    Airway Mallampati: II  TM Distance: >3 FB Neck ROM: Full    Dental no notable dental hx.    Pulmonary  06/02/2020 SARS coronavirus NEG   Pulmonary exam normal breath sounds clear to auscultation       Cardiovascular hypertension, Pt. on medications Normal cardiovascular exam Rhythm:Regular Rate:Normal     Neuro/Psych    GI/Hepatic   Endo/Other    Renal/GU      Musculoskeletal  (+) Arthritis , Osteoarthritis,    Abdominal   Peds  Hematology   Anesthesia Other Findings   Reproductive/Obstetrics                            Anesthesia Physical Anesthesia Plan  ASA: II  Anesthesia Plan: General   Post-op Pain Management:    Induction: Intravenous  PONV Risk Score and Plan: 3 and Ondansetron, Dexamethasone, Treatment may vary due to age or medical condition and Midazolam  Airway Management Planned: LMA  Additional Equipment: None  Intra-op Plan:   Post-operative Plan: Extubation in OR  Informed Consent: I have reviewed the patients History and Physical, chart, labs and discussed the procedure including the risks, benefits and alternatives for the proposed anesthesia with the patient or authorized representative who has indicated his/her understanding and acceptance.     Dental advisory given  Plan Discussed with: CRNA  Anesthesia Plan Comments:        Anesthesia Quick Evaluation

## 2020-06-06 NOTE — Anesthesia Procedure Notes (Signed)
Procedure Name: LMA Insertion Date/Time: 06/06/2020 12:51 PM Performed by: Ronnette Hila, CRNA Pre-anesthesia Checklist: Patient identified, Emergency Drugs available, Suction available and Patient being monitored Patient Re-evaluated:Patient Re-evaluated prior to induction Oxygen Delivery Method: Circle system utilized Preoxygenation: Pre-oxygenation with 100% oxygen Induction Type: IV induction Ventilation: Mask ventilation without difficulty LMA: LMA inserted LMA Size: 3.0 Number of attempts: 1 Airway Equipment and Method: Bite block Placement Confirmation: positive ETCO2 Tube secured with: Tape Dental Injury: Teeth and Oropharynx as per pre-operative assessment

## 2020-06-06 NOTE — Telephone Encounter (Signed)
Patient called and states that you could like for me to get her set up for PT.  I see in last office note that you were going to order for ROM after wire removal which was today. Is there a certain time frame in which you would like for this to be done or would you like for me to go ahead and enter referral for them to start as soon as they are able?  She requests PT here at our facility.

## 2020-06-06 NOTE — Discharge Instructions (Signed)
Use crutches or walker. Therapy can be arranged by calling [facility] at Dr. Ophelia Charter office. You may remove dressing after 24 hrs and shower then apply band aids over sutures. See Dr. Ophelia Charter in one week.   Post Anesthesia Home Care Instructions  Activity: Get plenty of rest for the remainder of the day. A responsible individual must stay with you for 24 hours following the procedure.  For the next 24 hours, DO NOT: -Drive a car -Advertising copywriter -Drink alcoholic beverages -Take any medication unless instructed by your physician -Make any legal decisions or sign important papers.  Meals: Start with liquid foods such as gelatin or soup. Progress to regular foods as tolerated. Avoid greasy, spicy, heavy foods. If nausea and/or vomiting occur, drink only clear liquids until the nausea and/or vomiting subsides. Call your physician if vomiting continues.  Special Instructions/Symptoms: Your throat may feel dry or sore from the anesthesia or the breathing tube placed in your throat during surgery. If this causes discomfort, gargle with warm salt water. The discomfort should disappear within 24 hours.  If you had a scopolamine patch placed behind your ear for the management of post- operative nausea and/or vomiting:  1. The medication in the patch is effective for 72 hours, after which it should be removed.  Wrap patch in a tissue and discard in the trash. Wash hands thoroughly with soap and water. 2. You may remove the patch earlier than 72 hours if you experience unpleasant side effects which may include dry mouth, dizziness or visual disturbances. 3. Avoid touching the patch. Wash your hands with soap and water after contact with the patch.

## 2020-06-06 NOTE — H&P (Signed)
Patient: Cheryl Glass                                          Date of Birth: Feb 22, 1951                                                 MRN: [id number] Visit Date: 05/29/2020 PCP: Sigmund Hazel, MD   Assessment & Plan: Post-ORIF patella with the cerclage wire through the tibial tubercle.  Incision is healed she is ready for wire removal we will set this up as an outpatient under LMA and some local with mini C arm localization of the wire.  Chief Complaint:      Chief Complaint  Patient presents with  . Right Knee - Follow-up    04/20/2020 ORIF Right patella   Visit Diagnoses:  1. Closed displaced comminuted fracture of right patella with routine healing, subsequent encounter     Plan: wire removal as outpt. She can start knee ROM after wire removal with PT.   Follow-Up Instructions: No follow-ups on file.   Orders:     Orders Placed This Encounter  Procedures  . XR Knee 1-2 Views Right   No orders of the defined types were placed in this encounter.   Imaging: No results found.  PMFS History:     Patient Active Problem List   Diagnosis Date Noted  . Right patella fracture 04/20/2020       Past Medical History:  Diagnosis Date  . Arthritis   . Eating disorder   . Hypertension   . Osteoporosis   . Wrist fracture 2013   right         Family History  Problem Relation Age of Onset  . Thyroid disease Sister        hypothyroid  . Osteoporosis Maternal Grandmother   . Cancer Father        pancreatic  . Diabetes Father          Past Surgical History:  Procedure Laterality Date  . AUGMENTATION MAMMAPLASTY    . BREAST ENHANCEMENT SURGERY    . CESAREAN SECTION    . HYSTEROSCOPY    . ORIF PATELLA Right 04/20/2020   Procedure: OPEN REDUCTION INTERNAL (ORIF) FIXATION RIGHT PATELLA;  Surgeon: Eldred Manges, MD;  Location: [facility] OR;  Service: Orthopedics;  Laterality: Right;   Social History        Occupational History  . Not  on file  Tobacco Use  . Smoking status: Never Smoker  . Smokeless tobacco: Never Used  Vaping Use  . Vaping Use: Never used  Substance and Sexual Activity  . Alcohol use: No    Alcohol/week: 0.0 standard drinks  . Drug use: No  . Sexual activity: Yes    Partners: Male    Comment: husband vasectomy

## 2020-06-06 NOTE — Op Note (Signed)
Preop diagnosis: Previous right inferior pole patella fracture fixation with retained wire.  Postop diagnosis: Same  Procedure: Removal of deep buried wire right knee, manipulation under anesthesia.  Surgeon: Annell Greening, MD  Tourniquet: None  Brief history: 69 year old female underwent fixation of a inferior pole patella fracture with multiple sutures placed through drill holes.  A protective wire was placed around the top of the patella through the tibial tubercle to protect the repair.  She now returns for wire removal so that she can start therapy on her knee.  Procedure after induction of anesthesia LMA tube placement prepping with DuraPrep from the ankle to the upper thigh extremity sheets and drapes were applied after impervious stockinette and Coban.  C-arm was used for visualization and the knot was identified medial just inferior pole the patella and small 1 cm incision was made.  Blunt dissection with hemostat and then hooking underneath the wire with a freer elevator was performed.  Hemostat was placed on both ends on either side of the wiring was cut just below the knot.  Using a spinal needle the lateral aspect of the wire was identified just inferior to the joint line and 57mm longitudinal incision was made.  Freer elevator was hooked on it the wire wire was brought up cut and then the 2 ends of the wire were removed a final picture was taken showing complete removal of the wire.  The knee was then gently flexed 120 degrees with some adhesions that popped.  Fingers were placed directly over the inferior pole the patella and the fracture repair held securely.  Patient tolerated procedure well after irrigation Marcaine was infiltrated a total of 5 cc and then 4-0 nylon interrupted simple skin closure putting 1 or 2 stitches in each of the 3 spots.  Xeroform 4 x 4's web roll and Ace wrap was applied for postoperative dressing.  Office follow-up 1 week.

## 2020-06-06 NOTE — Transfer of Care (Signed)
Immediate Anesthesia Transfer of Care Note  Patient: Cheryl Glass  Procedure(s) Performed: right knee wire removal (Right Knee) EXAM UNDER ANESTHESIA WITH MANIPULATION OF KNEE (Right Knee)  Patient Location: PACU  Anesthesia Type:General  Level of Consciousness: sedated  Airway & Oxygen Therapy: Patient Spontanous Breathing and Patient connected to face mask oxygen  Post-op Assessment: Report given to RN and Post -op Vital signs reviewed and stable  Post vital signs: Reviewed and stable  Last Vitals:  Vitals Value Taken Time  BP    Temp    Pulse 64 06/06/20 1324  Resp 10 06/06/20 1324  SpO2 99 % 06/06/20 1324  Vitals shown include unvalidated device data.  Last Pain:  Vitals:   06/06/20 1127  TempSrc: Oral  PainSc: 0-No pain      Patients Stated Pain Goal: 5 (06/06/20 1127)  Complications: No complications documented.

## 2020-06-07 ENCOUNTER — Encounter (HOSPITAL_BASED_OUTPATIENT_CLINIC_OR_DEPARTMENT_OTHER): Payer: Self-pay | Admitting: Orthopaedic Surgery

## 2020-06-07 NOTE — Telephone Encounter (Signed)
Referral entered  

## 2020-06-07 NOTE — Telephone Encounter (Signed)
OK to start PT now, ROM, quad SLR, ambulation with walker , progressive WBAT with improved quad strength. thanks

## 2020-06-07 NOTE — Addendum Note (Signed)
Addended by: Rogers Seeds on: 06/07/2020 10:11 AM   Modules accepted: Orders

## 2020-06-19 ENCOUNTER — Ambulatory Visit (INDEPENDENT_AMBULATORY_CARE_PROVIDER_SITE_OTHER): Payer: Medicare HMO | Admitting: Orthopaedic Surgery

## 2020-06-19 ENCOUNTER — Encounter: Payer: Self-pay | Admitting: Orthopaedic Surgery

## 2020-06-19 ENCOUNTER — Ambulatory Visit (INDEPENDENT_AMBULATORY_CARE_PROVIDER_SITE_OTHER): Payer: Medicare HMO

## 2020-06-19 VITALS — Ht 64.0 in | Wt 123.0 lb

## 2020-06-19 DIAGNOSIS — S82041D Displaced comminuted fracture of right patella, subsequent encounter for closed fracture with routine healing: Secondary | ICD-10-CM

## 2020-06-19 NOTE — Progress Notes (Signed)
Post-Op Visit Note   Patient: Cheryl Glass           Date of Birth: 09/17/1951           MRN: [id number] Visit Date: 06/19/2020 PCP: Sigmund Hazel, MD   Assessment & Plan:  Chief Complaint:  Chief Complaint  Patient presents with  . Right Knee - Routine Post Op    06/06/2020 Right knee wire removal  04/20/2020 ORIF right patella  69 year old white female who is status post the above procedures returns for recheck.  States that she is doing well.  Not taking any narcotic pain medication.  Patient's husband states that Dr. Ophelia Charter had previously advised that she be touchdown weightbearing right lower extremity.  It sounds like patient may have been putting more weight down than what was recommended.  She is not currently using knee immobilizer when she is up and ambulating.  She has been working on some straight leg raises and gentle range of motion.  She is flexing her knee to about 75 degrees. Visit Diagnoses:  1. Closed displaced comminuted fracture of right patella with routine healing, subsequent encounter     Plan: Patient will start PT as ordered in a couple of days.  They can work gentle range of motion 0 to 90 degrees but would not go past this and would not force flexion to 90 degrees.  Today passively she was maybe 75 degrees.  Therapist can work on straight leg raise for quad strengthening only for now.  Continue touchdown weightbearing with walker until recheck with Dr. Ophelia Charter in 2 weeks.  I did encourage patient to use her knee immobilizer at all times when she is up and ambulating until we see her back just for extra support and protection.  Advised patient that it is okay for her to have dental work at this time.  She has not had a joint replacement procedure by [place].  The dentist can give an antibiotic if they choose.  All questions answered.  Follow-Up Instructions: Return in about 2 weeks (around 07/03/2020).   Orders:  Orders Placed This Encounter  Procedures  . XR Knee 1-2  Views Right   No orders of the defined types were placed in this encounter.   Imaging: No results found.  PMFS History: Patient Active Problem List   Diagnosis Date Noted  . Displaced transverse fracture of left patella, subsequent encounter for closed fracture with delayed healing   . Right patella fracture 04/20/2020   Past Medical History:  Diagnosis Date  . Arthritis   . Eating disorder   . Hyperlipidemia   . Hypertension   . Osteoporosis   . Right patella fracture   . Wrist fracture 2013   right    Family History  Problem Relation Age of Onset  . Thyroid disease Sister        hypothyroid  . Osteoporosis Maternal Grandmother   . Cancer Father        pancreatic  . Diabetes Father     Past Surgical History:  Procedure Laterality Date  . AUGMENTATION MAMMAPLASTY    . BREAST ENHANCEMENT SURGERY    . CESAREAN SECTION    . EXAM UNDER ANESTHESIA WITH MANIPULATION OF KNEE Right 06/06/2020   Procedure: EXAM UNDER ANESTHESIA WITH MANIPULATION OF KNEE;  Surgeon: Eldred Manges, MD;  Location: [facility];  Service: Orthopedics;  Laterality: Right;  . HARDWARE REMOVAL Right 06/06/2020   Procedure: right knee wire removal;  Surgeon: Ophelia Charter,  Veverly Fells, MD;  Location: [facility];  Service: Orthopedics;  Laterality: Right;  . HYSTEROSCOPY    . ORIF PATELLA Right 04/20/2020   Procedure: OPEN REDUCTION INTERNAL (ORIF) FIXATION RIGHT PATELLA;  Surgeon: Eldred Manges, MD;  [facility]: [facility];  Service: Orthopedics;  Laterality: Right;   Social History   Occupational History  . Not on file  Tobacco Use  . Smoking status: Never Smoker  . Smokeless tobacco: Never Used  Vaping Use  . Vaping Use: Never used  Substance and Sexual Activity  . Alcohol use: No    Alcohol/week: 0.0 standard drinks  . Drug use: No  . Sexual activity: Yes    Partners: Male    Birth control/protection: Post-menopausal    Comment: husband vasectomy   Exam Pleasant female alert and  oriented in no acute distress.  Today sutures removed and Steri-Strips applied.  Incisions healing well without signs of infection.  Today range of motion about 0 to 75 degrees.  Calf nontender.

## 2020-06-21 ENCOUNTER — Encounter: Payer: Self-pay | Admitting: Rehabilitative and Restorative Service Providers"

## 2020-06-21 ENCOUNTER — Other Ambulatory Visit: Payer: Self-pay

## 2020-06-21 ENCOUNTER — Ambulatory Visit: Payer: Medicare HMO | Admitting: Rehabilitative and Restorative Service Providers"

## 2020-06-21 DIAGNOSIS — M6281 Muscle weakness (generalized): Secondary | ICD-10-CM | POA: Diagnosis not present

## 2020-06-21 DIAGNOSIS — M25661 Stiffness of right knee, not elsewhere classified: Secondary | ICD-10-CM

## 2020-06-21 DIAGNOSIS — M25561 Pain in right knee: Secondary | ICD-10-CM | POA: Diagnosis not present

## 2020-06-21 DIAGNOSIS — R262 Difficulty in walking, not elsewhere classified: Secondary | ICD-10-CM | POA: Diagnosis not present

## 2020-06-21 DIAGNOSIS — R600 Localized edema: Secondary | ICD-10-CM

## 2020-06-21 NOTE — Therapy (Signed)
[facility] Therapy [address] [place], [place], [address] Phone: [phone]   Fax:  [phone]  Physical Therapy Evaluation  Patient Details  Name: Cheryl Glass MRN: [id number] Date of Birth: 1950/12/29 Referring Provider (PT): Dr. Ophelia Charter   Encounter Date: 06/21/2020   PT End of Session - 06/21/20 1459    Visit Number 1    Number of Visits 12    Date for PT Re-Evaluation 08/30/20    Authorization Type [employer]    Authorization Time Period 06/21/2020-08/30/2020    Authorization - Visit Number 1    Authorization - Number of Visits 12    Progress Note Due on Visit 10    PT Start Time 1515    PT Stop Time 1556    PT Time Calculation (min) 41 min    Activity Tolerance Patient tolerated treatment well    Behavior During Therapy [facility] for tasks assessed/performed           Past Medical History:  Diagnosis Date   Arthritis    Eating disorder    Hyperlipidemia    Hypertension    Osteoporosis    Right patella fracture    Wrist fracture 2013   right    Past Surgical History:  Procedure Laterality Date   AUGMENTATION MAMMAPLASTY     BREAST ENHANCEMENT SURGERY     CESAREAN SECTION     EXAM UNDER ANESTHESIA WITH MANIPULATION OF KNEE Right 06/06/2020   Procedure: EXAM UNDER ANESTHESIA WITH MANIPULATION OF KNEE;  Surgeon: Eldred Manges, MD;  Location: [facility];  Service: Orthopedics;  Laterality: Right;   HARDWARE REMOVAL Right 06/06/2020   Procedure: right knee wire removal;  Surgeon: Eldred Manges, MD;  Location: [facility];  Service: Orthopedics;  Laterality: Right;   HYSTEROSCOPY     ORIF PATELLA Right 04/20/2020   Procedure: OPEN REDUCTION INTERNAL (ORIF) FIXATION RIGHT PATELLA;  Surgeon: Eldred Manges, MD;  Location: [facility];  Service: Orthopedics;  Laterality: Right;    There were no vitals filed for this visit.    Subjective Assessment - 06/21/20 1456    Subjective Fall reported on 04/17/2020 in  driveway.  Pt. indicated having surgery performed and then wire removed as well.  Pt. stated pain has been fairly low overall.  Pt. stated having walking with walker.  Pt. arrived to clinic c knee immobilizer on. Pt. stated previous level of function included walking and hiking for exercise in past.    Pertinent History ORIF Rt patella 04/20/2020, wire removal 06/06/2020    Limitations Walking;Standing    Patient Stated Goals Get back to walking, hiking, normal exercise    Currently in Pain? No/denies    Pain Score 1     Pain Location Knee    Pain Orientation Right    Pain Descriptors / Indicators Burning    Pain Type Surgical pain    Pain Frequency Occasional    Aggravating Factors  walking, standing, normal exercise limitations    Pain Relieving Factors insidious relief    Effect of Pain on Daily Activities Limited in exercise, standing/walking              [facility] PT Assessment - 06/21/20 0001      Assessment   Medical Diagnosis Rt ORIF patella, wire removal     Referring Provider (PT) Dr. Ophelia Charter    Onset Date/Surgical Date 04/20/20      Precautions   Precaution Comments Progressive WBAT c FWW.  MD limits  [facility] Physical Therapy [address] [place], [place], [address] Phone: [phone]   Fax:  [phone]  Physical Therapy Evaluation  Patient Details  Name: Cheryl Glass MRN: [id number] Date of Birth: 1950/12/29 Referring Provider (PT): Dr. Ophelia Charter   Encounter Date: 06/21/2020   PT End of Session - 06/21/20 1459    Visit Number 1    Number of Visits 12    Date for PT Re-Evaluation 08/30/20    Authorization Type [employer]    Authorization Time Period 06/21/2020-08/30/2020    Authorization - Visit Number 1    Authorization - Number of Visits 12    Progress Note Due on Visit 10    PT Start Time 1515    PT Stop Time 1556    PT Time Calculation (min) 41 min    Activity Tolerance Patient tolerated treatment well    Behavior During Therapy [facility] for tasks assessed/performed           Past Medical History:  Diagnosis Date   Arthritis    Eating disorder    Hyperlipidemia    Hypertension    Osteoporosis    Right patella fracture    Wrist fracture 2013   right    Past Surgical History:  Procedure Laterality Date   AUGMENTATION MAMMAPLASTY     BREAST ENHANCEMENT SURGERY     CESAREAN SECTION     EXAM UNDER ANESTHESIA WITH MANIPULATION OF KNEE Right 06/06/2020   Procedure: EXAM UNDER ANESTHESIA WITH MANIPULATION OF KNEE;  Surgeon: Eldred Manges, MD;  Location: [facility];  Service: Orthopedics;  Laterality: Right;   HARDWARE REMOVAL Right 06/06/2020   Procedure: right knee wire removal;  Surgeon: Eldred Manges, MD;  Location: [facility];  Service: Orthopedics;  Laterality: Right;   HYSTEROSCOPY     ORIF PATELLA Right 04/20/2020   Procedure: OPEN REDUCTION INTERNAL (ORIF) FIXATION RIGHT PATELLA;  Surgeon: Eldred Manges, MD;  [facility]: [facility];  Service: Orthopedics;  Laterality: Right;    There were no vitals filed for this visit.    Subjective Assessment - 06/21/20 1456    Subjective Fall reported on 04/17/2020 in  driveway.  Pt. indicated having surgery performed and then wire removed as well.  Pt. stated pain has been fairly low overall.  Pt. stated having walking with walker.  Pt. arrived to clinic c knee immobilizer on. Pt. stated previous level of function included walking and hiking for exercise in past.    Pertinent History ORIF Rt patella 04/20/2020, wire removal 06/06/2020    Limitations Walking;Standing    Patient Stated Goals Get back to walking, hiking, normal exercise    Currently in Pain? No/denies    Pain Score 1     Pain Location Knee    Pain Orientation Right    Pain Descriptors / Indicators Burning    Pain Type Surgical pain    Pain Frequency Occasional    Aggravating Factors  walking, standing, normal exercise limitations    Pain Relieving Factors insidious relief    Effect of Pain on Daily Activities Limited in exercise, standing/walking              [facility] PT Assessment - 06/21/20 0001      Assessment   Medical Diagnosis Rt ORIF patella, wire removal     Referring Provider (PT) Dr. Ophelia Charter    Onset Date/Surgical Date 04/20/20      Precautions   Precaution Comments Progressive WBAT c FWW.  MD limits  [facility] Physical Therapy [address] [place], [place], [address] Phone: [phone]   Fax:  [phone]  Physical Therapy Evaluation  Patient Details  Name: Cheryl Glass MRN: [id number] Date of Birth: 1950/12/29 Referring Provider (PT): Dr. Ophelia Charter   Encounter Date: 06/21/2020   PT End of Session - 06/21/20 1459    Visit Number 1    Number of Visits 12    Date for PT Re-Evaluation 08/30/20    Authorization Type [employer]    Authorization Time Period 06/21/2020-08/30/2020    Authorization - Visit Number 1    Authorization - Number of Visits 12    Progress Note Due on Visit 10    PT Start Time 1515    PT Stop Time 1556    PT Time Calculation (min) 41 min    Activity Tolerance Patient tolerated treatment well    Behavior During Therapy [facility] for tasks assessed/performed           Past Medical History:  Diagnosis Date   Arthritis    Eating disorder    Hyperlipidemia    Hypertension    Osteoporosis    Right patella fracture    Wrist fracture 2013   right    Past Surgical History:  Procedure Laterality Date   AUGMENTATION MAMMAPLASTY     BREAST ENHANCEMENT SURGERY     CESAREAN SECTION     EXAM UNDER ANESTHESIA WITH MANIPULATION OF KNEE Right 06/06/2020   Procedure: EXAM UNDER ANESTHESIA WITH MANIPULATION OF KNEE;  Surgeon: Eldred Manges, MD;  Location: [facility];  Service: Orthopedics;  Laterality: Right;   HARDWARE REMOVAL Right 06/06/2020   Procedure: right knee wire removal;  Surgeon: Eldred Manges, MD;  Location: [facility];  Service: Orthopedics;  Laterality: Right;   HYSTEROSCOPY     ORIF PATELLA Right 04/20/2020   Procedure: OPEN REDUCTION INTERNAL (ORIF) FIXATION RIGHT PATELLA;  Surgeon: Eldred Manges, MD;  Location: [facility];  Service: Orthopedics;  Laterality: Right;    There were no vitals filed for this visit.    Subjective Assessment - 06/21/20 1456    Subjective Fall reported on 04/17/2020 in  driveway.  Pt. indicated having surgery performed and then wire removed as well.  Pt. stated pain has been fairly low overall.  Pt. stated having walking with walker.  Pt. arrived to clinic c knee immobilizer on. Pt. stated previous level of function included walking and hiking for exercise in past.    Pertinent History ORIF Rt patella 04/20/2020, wire removal 06/06/2020    Limitations Walking;Standing    Patient Stated Goals Get back to walking, hiking, normal exercise    Currently in Pain? No/denies    Pain Score 1     Pain Location Knee    Pain Orientation Right    Pain Descriptors / Indicators Burning    Pain Type Surgical pain    Pain Frequency Occasional    Aggravating Factors  walking, standing, normal exercise limitations    Pain Relieving Factors insidious relief    Effect of Pain on Daily Activities Limited in exercise, standing/walking              [facility] PT Assessment - 06/21/20 0001      Assessment   Medical Diagnosis Rt ORIF patella, wire removal     Referring Provider (PT) Dr. Ophelia Charter    Onset Date/Surgical Date 04/20/20      Precautions   Precaution Comments Progressive WBAT c FWW.  MD limits  will benefit from skilled therapeutic intervention in order to improve the following deficits and impairments:  Abnormal gait, Hypomobility, Decreased activity tolerance, Decreased strength, Pain, Difficulty walking, Decreased mobility, Decreased balance, Decreased range of motion, Impaired perceived functional ability, Impaired flexibility, Decreased coordination  Visit Diagnosis: Acute pain of right knee  Muscle weakness (generalized)  Stiffness of right knee, not elsewhere classified  Difficulty in walking, not elsewhere classified  Localized edema     Problem List Patient Active Problem List   Diagnosis Date Noted   Displaced transverse fracture  of left patella, subsequent encounter for closed fracture with delayed healing    Right patella fracture 04/20/2020   Chyrel Masson, PT, DPT, OCS, ATC 06/21/20  4:06 PM     [facility] [address] [place], [place], [address] Phone: [phone]   Fax:  [phone]  Name: Cheryl Glass MRN: [id number] Date of Birth: [birth year]

## 2020-06-21 NOTE — Patient Instructions (Signed)
Access Code: [email or web address] URL: [email or web address] Date: 06/21/2020 Prepared by: Chyrel Masson  Exercises Small Range Straight Leg Raise - 1 x daily - 7 x weekly - 3 sets - 10 reps Sidelying Hip Abduction - 1 x daily - 7 x weekly - 3 sets - 10 reps Long Sitting Ankle Eversion with Resistance - 1 x daily - 7 x weekly - 3 sets - 10 reps Long Sitting Ankle Inversion with Resistance - 1 x daily - 7 x weekly - 3 sets - 10 reps Long Sitting Ankle Dorsiflexion with Anchored Resistance - 1 x daily - 7 x weekly - 3 sets - 10 reps Prone Hip Extension - 1 x daily - 7 x weekly - 3 sets - 10 reps

## 2020-06-24 DIAGNOSIS — H524 Presbyopia: Secondary | ICD-10-CM | POA: Diagnosis not present

## 2020-07-03 ENCOUNTER — Ambulatory Visit (INDEPENDENT_AMBULATORY_CARE_PROVIDER_SITE_OTHER): Payer: Medicare HMO | Admitting: Orthopaedic Surgery

## 2020-07-03 ENCOUNTER — Encounter: Payer: Self-pay | Admitting: Orthopaedic Surgery

## 2020-07-03 VITALS — Ht 62.0 in | Wt 113.0 lb

## 2020-07-03 DIAGNOSIS — S82032G Displaced transverse fracture of left patella, subsequent encounter for closed fracture with delayed healing: Secondary | ICD-10-CM

## 2020-07-03 DIAGNOSIS — S82031G Displaced transverse fracture of right patella, subsequent encounter for closed fracture with delayed healing: Secondary | ICD-10-CM

## 2020-07-04 ENCOUNTER — Other Ambulatory Visit: Payer: Self-pay

## 2020-07-04 ENCOUNTER — Ambulatory Visit (INDEPENDENT_AMBULATORY_CARE_PROVIDER_SITE_OTHER): Payer: Medicare HMO | Admitting: Rehabilitative and Restorative Service Providers"

## 2020-07-04 ENCOUNTER — Encounter: Payer: Self-pay | Admitting: Rehabilitative and Restorative Service Providers"

## 2020-07-04 DIAGNOSIS — M25661 Stiffness of right knee, not elsewhere classified: Secondary | ICD-10-CM | POA: Diagnosis not present

## 2020-07-04 DIAGNOSIS — M6281 Muscle weakness (generalized): Secondary | ICD-10-CM

## 2020-07-04 DIAGNOSIS — R600 Localized edema: Secondary | ICD-10-CM

## 2020-07-04 DIAGNOSIS — M25561 Pain in right knee: Secondary | ICD-10-CM

## 2020-07-04 DIAGNOSIS — R262 Difficulty in walking, not elsewhere classified: Secondary | ICD-10-CM

## 2020-07-04 NOTE — Progress Notes (Signed)
Post-Op Visit Note   Patient: Cheryl Glass           Date of Birth: 06/20/1951           MRN: [id number] Visit Date: 07/03/2020 PCP: Sigmund Hazel, MD   Assessment & Plan: Post wire removal after inferior pole patella fracture with patellar tendon repair.  Incision looks good she does not need to be in the knee immobilizer.  She is very anxious we got her to flexion at 45 to 55 degrees without pain.  She is 10 weeks out from repair.  Chief Complaint:  Chief Complaint  Patient presents with  . Right Knee - Routine Post Op   Visit Diagnoses:  1. Displaced transverse fracture of left patella, subsequent encounter for closed fracture with delayed healing     Plan: Patient to do leg lifts.  She needs to work on active assistive knee range of motion crossing her ankles.  Continue with a walker until she has excellent quad strength.  Recheck 1 month lateral x-ray patella on return.  Follow-Up Instructions: No follow-ups on file.   Orders:  No orders of the defined types were placed in this encounter.  No orders of the defined types were placed in this encounter.   Imaging: No results found.  PMFS History: Patient Active Problem List   Diagnosis Date Noted  . Displaced transverse fracture of left patella, subsequent encounter for closed fracture with delayed healing   . Right patella fracture 04/20/2020   Past Medical History:  Diagnosis Date  . Arthritis   . Eating disorder   . Hyperlipidemia   . Hypertension   . Osteoporosis   . Right patella fracture   . Wrist fracture 2013   right    Family History  Problem Relation Age of Onset  . Thyroid disease Sister        hypothyroid  . Osteoporosis Maternal Grandmother   . Cancer Father        pancreatic  . Diabetes Father     Past Surgical History:  Procedure Laterality Date  . AUGMENTATION MAMMAPLASTY    . BREAST ENHANCEMENT SURGERY    . CESAREAN SECTION    . EXAM UNDER ANESTHESIA WITH MANIPULATION OF KNEE  Right 06/06/2020   Procedure: EXAM UNDER ANESTHESIA WITH MANIPULATION OF KNEE;  Surgeon: Eldred Manges, MD;  Location: [facility];  Service: Orthopedics;  Laterality: Right;  . HARDWARE REMOVAL Right 06/06/2020   Procedure: right knee wire removal;  Surgeon: Eldred Manges, MD;  Location: [facility];  Service: Orthopedics;  Laterality: Right;  . HYSTEROSCOPY    . ORIF PATELLA Right 04/20/2020   Procedure: OPEN REDUCTION INTERNAL (ORIF) FIXATION RIGHT PATELLA;  Surgeon: Eldred Manges, MD;  Location: [facility];  Service: Orthopedics;  Laterality: Right;   Social History   Occupational History  . Not on file  Tobacco Use  . Smoking status: Never Smoker  . Smokeless tobacco: Never Used  Vaping Use  . Vaping Use: Never used  Substance and Sexual Activity  . Alcohol use: No    Alcohol/week: 0.0 standard drinks  . Drug use: No  . Sexual activity: Yes    Partners: Male    Birth control/protection: Post-menopausal    Comment: husband vasectomy

## 2020-07-04 NOTE — Therapy (Addendum)
knee, not elsewhere classified  Difficulty in walking, not elsewhere classified  Localized edema     Problem List Patient Active Problem List   Diagnosis Date Noted   Displaced transverse fracture of left patella, subsequent encounter for closed fracture with delayed healing    Right patella fracture 04/20/2020    Chyrel Masson, PT, DPT, OCS, ATC 07/04/20  3:03 PM  Added documentation for nustep lvl 5 10 mins  Chyrel Masson, PT, DPT, OCS, ATC 07/06/20  11:55 AM     [facility] Physical Therapy [address] [place], [place], [address] Phone: [phone]   Fax:  [phone]  Name: Cheryl Glass MRN: [id number] Date of Birth: Feb 16, 1951  [facility] Physical Therapy [address] [place], [place], [address] Phone: [phone]   Fax:  [phone]  Physical Therapy Treatment  Patient Details  Name: Bree Heinzelman MRN: [id number] Date of Birth: 02-06-1951 Referring Provider (PT): Dr. Ophelia Charter   Encounter Date: 07/04/2020   PT End of Session - 07/04/20 1416    Visit Number 2    Number of Visits 12    Date for PT Re-Evaluation 08/30/20    Authorization Type [employer]    Authorization Time Period 06/21/2020-08/30/2020    Authorization - Visit Number 2    Authorization - Number of Visits 12    Progress Note Due on Visit 10    PT Start Time 1425    PT Stop Time 1506    PT Time Calculation (min) 41 min    Activity Tolerance Patient tolerated treatment well    Behavior During Therapy [facility] for tasks assessed/performed           Past Medical History:  Diagnosis Date   Arthritis    Eating disorder    Hyperlipidemia    Hypertension    Osteoporosis    Right patella fracture    Wrist fracture 2013   right    Past Surgical History:  Procedure Laterality Date   AUGMENTATION MAMMAPLASTY     BREAST ENHANCEMENT SURGERY     CESAREAN SECTION     EXAM UNDER ANESTHESIA WITH MANIPULATION OF KNEE Right 06/06/2020   Procedure: EXAM UNDER ANESTHESIA WITH MANIPULATION OF KNEE;  Surgeon: Eldred Manges, MD;  Location: [facility];  Service: Orthopedics;  Laterality: Right;   HARDWARE REMOVAL Right 06/06/2020   Procedure: right knee wire removal;  Surgeon: Eldred Manges, MD;  Location: [facility];  Service: Orthopedics;  Laterality: Right;   HYSTEROSCOPY     ORIF PATELLA Right 04/20/2020   Procedure: OPEN REDUCTION INTERNAL (ORIF) FIXATION RIGHT PATELLA;  Surgeon: Eldred Manges, MD;  [facility];  Service: Orthopedics;  Laterality: Right;    There were no vitals filed for this visit.   Subjective Assessment - 07/04/20 1429    Subjective Pt. indicated out of brace since  yesterday.  Was able to bend knee to sit better but still having tightness/pain at end range.    Pertinent History ORIF Rt patella 04/20/2020, wire removal 06/06/2020    Limitations Walking;Standing    Patient Stated Goals Get back to walking, hiking, normal exercise    Currently in Pain? No/denies    Pain Score 0-No pain                             [facility] Adult PT Treatment/Exercise - 07/04/20 0001      Exercises   Other Exercises  BFR LOP supine 220 cuff size 4      Knee/Hip Exercises: Stretches   Gastroc Stretch 30 seconds;5 reps   Rt LE on incilne board     Knee/Hip Exercises: Seated   Other Seated Knee/Hip Exercises tailgate knee flexion stretch 2 mins c overpressure      Knee/Hip Exercises: Supine   Other Supine Knee/Hip Exercises supine SLR 30x c 75% LOP Rt LE      Manual Therapy   Manual therapy comments seated distraction, IR, flexion 2                    PT Short Term Goals - 06/21/20 1500      PT  knee, not elsewhere classified  Difficulty in walking, not elsewhere classified  Localized edema     Problem List Patient Active Problem List   Diagnosis Date Noted   Displaced transverse fracture of left patella, subsequent encounter for closed fracture with delayed healing    Right patella fracture 04/20/2020    Chyrel Masson, PT, DPT, OCS, ATC 07/04/20  3:03 PM  Added documentation for nustep lvl 5 10 mins  Chyrel Masson, PT, DPT, OCS, ATC 07/06/20  11:55 AM     [facility] [address] [place], [place], [address] Phone: [phone]   Fax:  [phone]  Name: Cheryl Glass MRN: [id number] Date of Birth: Feb 16, 1951

## 2020-07-06 ENCOUNTER — Ambulatory Visit (INDEPENDENT_AMBULATORY_CARE_PROVIDER_SITE_OTHER): Payer: Medicare HMO | Admitting: Rehabilitative and Restorative Service Providers"

## 2020-07-06 ENCOUNTER — Encounter: Payer: Self-pay | Admitting: Rehabilitative and Restorative Service Providers"

## 2020-07-06 ENCOUNTER — Other Ambulatory Visit: Payer: Self-pay

## 2020-07-06 DIAGNOSIS — R600 Localized edema: Secondary | ICD-10-CM

## 2020-07-06 DIAGNOSIS — M25661 Stiffness of right knee, not elsewhere classified: Secondary | ICD-10-CM | POA: Diagnosis not present

## 2020-07-06 DIAGNOSIS — M25561 Pain in right knee: Secondary | ICD-10-CM

## 2020-07-06 DIAGNOSIS — R262 Difficulty in walking, not elsewhere classified: Secondary | ICD-10-CM | POA: Diagnosis not present

## 2020-07-06 DIAGNOSIS — M6281 Muscle weakness (generalized): Secondary | ICD-10-CM | POA: Diagnosis not present

## 2020-07-06 NOTE — Therapy (Signed)
[facility] Physical Therapy [address] [place], [place], [address] Phone: [phone]   Fax:  [phone]  Physical Therapy Treatment  Patient Details  Name: Cheryl Glass MRN: [id number] Date of Birth: 08-Jun-1951 Referring Provider (PT): Dr. Ophelia Charter   Encounter Date: 07/06/2020   PT End of Session - 07/06/20 1148    Visit Number 3    Number of Visits 12    Date for PT Re-Evaluation 08/30/20    Authorization Type [employer]    Authorization Time Period 06/21/2020-08/30/2020    Authorization - Visit Number 3    Authorization - Number of Visits 12    Progress Note Due on Visit 10    PT Start Time 1142    PT Stop Time 1228    PT Time Calculation (min) 46 min    Activity Tolerance Patient tolerated treatment well    Behavior During Therapy [facility] for tasks assessed/performed           Past Medical History:  Diagnosis Date  . Arthritis   . Eating disorder   . Hyperlipidemia   . Hypertension   . Osteoporosis   . Right patella fracture   . Wrist fracture 2013   right    Past Surgical History:  Procedure Laterality Date  . AUGMENTATION MAMMAPLASTY    . BREAST ENHANCEMENT SURGERY    . CESAREAN SECTION    . EXAM UNDER ANESTHESIA WITH MANIPULATION OF KNEE Right 06/06/2020   Procedure: EXAM UNDER ANESTHESIA WITH MANIPULATION OF KNEE;  Surgeon: Eldred Manges, MD;  Location: [facility];  Service: Orthopedics;  Laterality: Right;  . HARDWARE REMOVAL Right 06/06/2020   Procedure: right knee wire removal;  Surgeon: Eldred Manges, MD;  Location: [facility];  Service: Orthopedics;  Laterality: Right;  . HYSTEROSCOPY    . ORIF PATELLA Right 04/20/2020   Procedure: OPEN REDUCTION INTERNAL (ORIF) FIXATION RIGHT PATELLA;  Surgeon: Eldred Manges, MD;  [facility];  Service: Orthopedics;  Laterality: Right;    There were no vitals filed for this visit.   Subjective Assessment - 07/06/20 1144    Subjective Pt. indicated having some  increase in pink color of skin on Rt leg compared to Lt c exercise.  Ankle/foot tightness "catch" still evident.    Pertinent History ORIF Rt patella 04/20/2020, wire removal 06/06/2020    Limitations Walking;Standing    Patient Stated Goals Get back to walking, hiking, normal exercise    Currently in Pain? No/denies                             [facility] Adult PT Treatment/Exercise - 07/06/20 0001      Knee/Hip Exercises: Stretches   Gastroc Stretch 5 reps;30 seconds;Right   incline board     Knee/Hip Exercises: Aerobic   Nustep Lvl 5 10 mins      Knee/Hip Exercises: Standing   Other Standing Knee Exercises wall sit approx. 30 deg to fatigue x 3      Knee/Hip Exercises: Seated   Other Seated Knee/Hip Exercises hamstring curl green band 30 x Rt LE    Other Seated Knee/Hip Exercises seated SLR 3 x 10      Modalities   Modalities Vasopneumatic      Vasopneumatic   Number Minutes Vasopneumatic  10 minutes    Vasopnuematic Location  Knee    Vasopneumatic Pressure Medium    Vasopneumatic Temperature  34  walking, Decreased mobility, Decreased balance, Decreased range of motion, Impaired perceived functional ability, Impaired flexibility, Decreased coordination  Visit Diagnosis: Acute pain of right knee  Muscle weakness (generalized)  Stiffness of right knee, not elsewhere classified  Difficulty in walking, not elsewhere classified  Localized edema     Problem List Patient Active Problem List   Diagnosis Date Noted  . Displaced transverse fracture of left patella, subsequent encounter for closed fracture with delayed healing   . Right patella fracture 04/20/2020    Chyrel Masson, PT, DPT, OCS, ATC 07/06/20  12:17 PM    [facility] Physical Therapy [address] [place], [place], [address] Phone: [phone]   Fax:  [phone]  Name: Mirabel Ahlgren MRN: [id number] Date of Birth: Feb 14, 1951  [facility] Physical Therapy [address] [place], [place], [address] Phone: [phone]   Fax:  [phone]  Physical Therapy Treatment  Patient Details  Name: Cheryl Glass MRN: [id number] Date of Birth: 08-Jun-1951 Referring Provider (PT): Dr. Ophelia Charter   Encounter Date: 07/06/2020   PT End of Session - 07/06/20 1148    Visit Number 3    Number of Visits 12    Date for PT Re-Evaluation 08/30/20    Authorization Type [employer]    Authorization Time Period 06/21/2020-08/30/2020    Authorization - Visit Number 3    Authorization - Number of Visits 12    Progress Note Due on Visit 10    PT Start Time 1142    PT Stop Time 1228    PT Time Calculation (min) 46 min    Activity Tolerance Patient tolerated treatment well    Behavior During Therapy [facility] for tasks assessed/performed           Past Medical History:  Diagnosis Date  . Arthritis   . Eating disorder   . Hyperlipidemia   . Hypertension   . Osteoporosis   . Right patella fracture   . Wrist fracture 2013   right    Past Surgical History:  Procedure Laterality Date  . AUGMENTATION MAMMAPLASTY    . BREAST ENHANCEMENT SURGERY    . CESAREAN SECTION    . EXAM UNDER ANESTHESIA WITH MANIPULATION OF KNEE Right 06/06/2020   Procedure: EXAM UNDER ANESTHESIA WITH MANIPULATION OF KNEE;  Surgeon: Eldred Manges, MD;  Location: [facility];  Service: Orthopedics;  Laterality: Right;  . HARDWARE REMOVAL Right 06/06/2020   Procedure: right knee wire removal;  Surgeon: Eldred Manges, MD;  Location: [facility];  Service: Orthopedics;  Laterality: Right;  . HYSTEROSCOPY    . ORIF PATELLA Right 04/20/2020   Procedure: OPEN REDUCTION INTERNAL (ORIF) FIXATION RIGHT PATELLA;  Surgeon: Eldred Manges, MD;  [facility];  Service: Orthopedics;  Laterality: Right;    There were no vitals filed for this visit.   Subjective Assessment - 07/06/20 1144    Subjective Pt. indicated having some  increase in pink color of skin on Rt leg compared to Lt c exercise.  Ankle/foot tightness "catch" still evident.    Pertinent History ORIF Rt patella 04/20/2020, wire removal 06/06/2020    Limitations Walking;Standing    Patient Stated Goals Get back to walking, hiking, normal exercise    Currently in Pain? No/denies                             [facility] Adult PT Treatment/Exercise - 07/06/20 0001      Knee/Hip Exercises: Stretches   Gastroc Stretch 5 reps;30 seconds;Right   incline board     Knee/Hip Exercises: Aerobic   Nustep Lvl 5 10 mins      Knee/Hip Exercises: Standing   Other Standing Knee Exercises wall sit approx. 30 deg to fatigue x 3      Knee/Hip Exercises: Seated   Other Seated Knee/Hip Exercises hamstring curl green band 30 x Rt LE    Other Seated Knee/Hip Exercises seated SLR 3 x 10      Modalities   Modalities Vasopneumatic      Vasopneumatic   Number Minutes Vasopneumatic  10 minutes    Vasopnuematic Location  Knee    Vasopneumatic Pressure Medium    Vasopneumatic Temperature  34

## 2020-07-10 ENCOUNTER — Ambulatory Visit: Payer: Medicare HMO | Admitting: Physical Therapy

## 2020-07-10 ENCOUNTER — Encounter: Payer: Self-pay | Admitting: Physical Therapy

## 2020-07-10 ENCOUNTER — Other Ambulatory Visit: Payer: Self-pay

## 2020-07-10 DIAGNOSIS — M6281 Muscle weakness (generalized): Secondary | ICD-10-CM

## 2020-07-10 DIAGNOSIS — R262 Difficulty in walking, not elsewhere classified: Secondary | ICD-10-CM | POA: Diagnosis not present

## 2020-07-10 DIAGNOSIS — R600 Localized edema: Secondary | ICD-10-CM

## 2020-07-10 DIAGNOSIS — M25661 Stiffness of right knee, not elsewhere classified: Secondary | ICD-10-CM

## 2020-07-10 DIAGNOSIS — M25561 Pain in right knee: Secondary | ICD-10-CM | POA: Diagnosis not present

## 2020-07-10 NOTE — Therapy (Signed)
[facility] Physical Therapy [address] [place], [place], [address] Phone: [phone]   Fax:  [phone]  Physical Therapy Treatment  Patient Details  Name: Cheryl Glass MRN: [id number] Date of Birth: 04/30/1951 Referring Provider (PT): Dr. Ophelia Charter   Encounter Date: 07/10/2020   PT End of Session - 07/10/20 1554    Visit Number 4    Number of Visits 12    Date for PT Re-Evaluation 08/30/20    Authorization Type [employer]    Authorization Time Period 06/21/2020-08/30/2020    Authorization - Visit Number 4    Authorization - Number of Visits 12    Progress Note Due on Visit 10    PT Start Time 1555    PT Stop Time 1655    PT Time Calculation (min) 60 min    Activity Tolerance Patient tolerated treatment well    Behavior During Therapy [facility] for tasks assessed/performed           Past Medical History:  Diagnosis Date  . Arthritis   . Eating disorder   . Hyperlipidemia   . Hypertension   . Osteoporosis   . Right patella fracture   . Wrist fracture 2013   right    Past Surgical History:  Procedure Laterality Date  . AUGMENTATION MAMMAPLASTY    . BREAST ENHANCEMENT SURGERY    . CESAREAN SECTION    . EXAM UNDER ANESTHESIA WITH MANIPULATION OF KNEE Right 06/06/2020   Procedure: EXAM UNDER ANESTHESIA WITH MANIPULATION OF KNEE;  Surgeon: Eldred Manges, MD;  Location: [facility];  Service: Orthopedics;  Laterality: Right;  . HARDWARE REMOVAL Right 06/06/2020   Procedure: right knee wire removal;  Surgeon: Eldred Manges, MD;  Location: [facility];  Service: Orthopedics;  Laterality: Right;  . HYSTEROSCOPY    . ORIF PATELLA Right 04/20/2020   Procedure: OPEN REDUCTION INTERNAL (ORIF) FIXATION RIGHT PATELLA;  Surgeon: Eldred Manges, MD;  [facility];  Service: Orthopedics;  Laterality: Right;    There were no vitals filed for this visit.   Subjective Assessment - 07/10/20 1555    Subjective She has been doing her  exercises.    Pertinent History ORIF Rt patella 04/20/2020, wire removal 06/06/2020    Limitations Walking;Standing    Patient Stated Goals Get back to walking, hiking, normal exercise    Currently in Pain? No/denies                             [facility] Adult PT Treatment/Exercise - 07/10/20 1555      Self-Care   Self-Care ADL's    ADL's PT demo & verbal cues in how to get in & out of 30" bed. Pt return demo understanding.  Pt demo & verbal cues on using pillows in corners of bed to tent sheets off feet to roll & change positions without resistance from covers.  PT instructed in use of pillows for positioning in sidelying & supine.  Pt verbalized understanding of all instructions.       Knee/Hip Exercises: Stretches   Gastroc Stretch 5 reps;30 seconds;Right   incline board     Knee/Hip Exercises: Aerobic   Nustep seat 6 Lvl 5 with BLEs & BUEs 10 mins with 5 sec hold       Knee/Hip Exercises: Standing   Other Standing Knee Exercises --      Knee/Hip Exercises: Seated   Other Seated Knee/Hip Exercises --  Time 10    Period Weeks    Status New    Target Date 08/30/20                 Plan - 07/10/20 1554    Clinical Impression Statement PT reviewed HEP as patient reported not fully doing them. She reports and appear to have better understanding.  PT instructed in how to get in & out of her high bed and positioning in bed. She verbalizes understanding and plans to sleep in her own bed for first time since surgery.    Personal Factors and Comorbidities Comorbidity 3+    Comorbidities osteoporosis, HTN, hyperlipidemia    Examination-Activity Limitations Sit;Sleep;Squat;Stairs;Stand;Locomotion Level;Lift;Transfers    Examination-Participation Restrictions Community Activity    Stability/Clinical Decision Making Evolving/Moderate complexity    Rehab Potential Good    PT Frequency --   1-2x/week   PT Duration --   10wk   PT Treatment/Interventions ADLs/Self Care Home Management;Electrical Stimulation;Iontophoresis 4mg /ml Dexamethasone;Moist Heat;Neuromuscular re-education;Ultrasound;Stair training;Functional mobility training;Gait training;Patient/family education;Therapeutic activities;Therapeutic exercise;Balance training;Manual techniques;Vasopneumatic Device;Taping;Passive range of motion;Dry needling;Spinal Manipulations;Joint Manipulations    PT Next Visit Plan knee flexion mobility, ankle mobility, WB improvement, strengthening    PT Home Exercise Plan [email or web address]    Consulted and Agree with Plan of Care Patient           Patient will benefit from skilled therapeutic intervention in order to improve the following deficits and impairments:  Abnormal gait, Hypomobility, Decreased activity tolerance, Decreased strength, Pain, Difficulty walking, Decreased mobility, Decreased balance, Decreased range of motion, Impaired perceived functional ability, Impaired flexibility, Decreased coordination  Visit Diagnosis: Acute  pain of right knee  Muscle weakness (generalized)  Stiffness of right knee, not elsewhere classified  Difficulty in walking, not elsewhere classified  Localized edema     Problem List Patient Active Problem List   Diagnosis Date Noted  . Displaced transverse fracture of left patella, subsequent encounter for closed fracture with delayed healing   . Right patella fracture 04/20/2020    04/22/2020 PT, DPT 07/10/2020, 5:18 PM  [facility] Physical Therapy [address] [place], [place], [place] Phone: [phone]   Fax:  [phone]  Name: [name] MRN: Cheryl Glass Date of Birth: 1951/09/15  Time 10    Period Weeks    Status New    Target Date 08/30/20                 Plan - 07/10/20 1554    Clinical Impression Statement PT reviewed HEP as patient reported not fully doing them. She reports and appear to have better understanding.  PT instructed in how to get in & out of her high bed and positioning in bed. She verbalizes understanding and plans to sleep in her own bed for first time since surgery.    Personal Factors and Comorbidities Comorbidity 3+    Comorbidities osteoporosis, HTN, hyperlipidemia    Examination-Activity Limitations Sit;Sleep;Squat;Stairs;Stand;Locomotion Level;Lift;Transfers    Examination-Participation Restrictions Community Activity    Stability/Clinical Decision Making Evolving/Moderate complexity    Rehab Potential Good    PT Frequency --   1-2x/week   PT Duration --   10wk   PT Treatment/Interventions ADLs/Self Care Home Management;Electrical Stimulation;Iontophoresis 4mg /ml Dexamethasone;Moist Heat;Neuromuscular re-education;Ultrasound;Stair training;Functional mobility training;Gait training;Patient/family education;Therapeutic activities;Therapeutic exercise;Balance training;Manual techniques;Vasopneumatic Device;Taping;Passive range of motion;Dry needling;Spinal Manipulations;Joint Manipulations    PT Next Visit Plan knee flexion mobility, ankle mobility, WB improvement, strengthening    PT Home Exercise Plan [email or web address]    Consulted and Agree with Plan of Care Patient           Patient will benefit from skilled therapeutic intervention in order to improve the following deficits and impairments:  Abnormal gait, Hypomobility, Decreased activity tolerance, Decreased strength, Pain, Difficulty walking, Decreased mobility, Decreased balance, Decreased range of motion, Impaired perceived functional ability, Impaired flexibility, Decreased coordination  Visit Diagnosis: Acute  pain of right knee  Muscle weakness (generalized)  Stiffness of right knee, not elsewhere classified  Difficulty in walking, not elsewhere classified  Localized edema     Problem List Patient Active Problem List   Diagnosis Date Noted  . Displaced transverse fracture of left patella, subsequent encounter for closed fracture with delayed healing   . Right patella fracture 04/20/2020    04/22/2020 PT, DPT 07/10/2020, 5:18 PM  [facility] Physical Therapy [address] [place], [place], [place] Phone: [phone]   Fax:  [phone]  Name: [name] MRN: Cheryl Glass Date of Birth: 1951/09/15

## 2020-07-10 NOTE — Patient Instructions (Signed)
Access Code: [email or web address] URL: [email or web address] Date: 07/10/2020 Prepared by: Vladimir Faster  Exercises Small Range Straight Leg Raise - 1 x daily - 7 x weekly - 3 sets - 10 reps Sidelying Hip Abduction - 1 x daily - 7 x weekly - 3 sets - 10 reps Long Sitting Ankle Eversion with Resistance - 1 x daily - 7 x weekly - 3 sets - 10 reps Long Sitting Ankle Inversion with Resistance - 1 x daily - 7 x weekly - 3 sets - 10 reps Long Sitting Ankle Dorsiflexion with Anchored Resistance - 1 x daily - 7 x weekly - 3 sets - 10 reps Prone Hip Extension - 1 x daily - 7 x weekly - 3 sets - 10 reps

## 2020-07-12 ENCOUNTER — Encounter: Payer: Self-pay | Admitting: Rehabilitative and Restorative Service Providers"

## 2020-07-12 ENCOUNTER — Other Ambulatory Visit: Payer: Self-pay

## 2020-07-12 ENCOUNTER — Ambulatory Visit: Payer: Medicare HMO | Admitting: Rehabilitative and Restorative Service Providers"

## 2020-07-12 DIAGNOSIS — R262 Difficulty in walking, not elsewhere classified: Secondary | ICD-10-CM | POA: Diagnosis not present

## 2020-07-12 DIAGNOSIS — M25561 Pain in right knee: Secondary | ICD-10-CM | POA: Diagnosis not present

## 2020-07-12 DIAGNOSIS — M25661 Stiffness of right knee, not elsewhere classified: Secondary | ICD-10-CM | POA: Diagnosis not present

## 2020-07-12 DIAGNOSIS — M6281 Muscle weakness (generalized): Secondary | ICD-10-CM

## 2020-07-12 DIAGNOSIS — R600 Localized edema: Secondary | ICD-10-CM | POA: Diagnosis not present

## 2020-07-12 NOTE — Therapy (Signed)
[facility] Physical Therapy [address] [place], [place], [address] Phone: [phone]   Fax:  [phone]  Physical Therapy Treatment  Patient Details  Name: Cheryl Glass MRN: [id number] Date of Birth: August 21, 1951 Referring Provider (PT): Dr. Ophelia Charter   Encounter Date: 07/12/2020   PT End of Session - 07/12/20 1113    Visit Number 5    Number of Visits 12    Date for PT Re-Evaluation 08/30/20    Authorization Type [employer]    Authorization Time Period 06/21/2020-08/30/2020    Authorization - Visit Number 5    Authorization - Number of Visits 12    Progress Note Due on Visit 10    PT Start Time 1100    PT Stop Time 1140    PT Time Calculation (min) 40 min    Activity Tolerance Patient tolerated treatment well    Behavior During Therapy [facility] for tasks assessed/performed           Past Medical History:  Diagnosis Date  . Arthritis   . Eating disorder   . Hyperlipidemia   . Hypertension   . Osteoporosis   . Right patella fracture   . Wrist fracture 2013   right    Past Surgical History:  Procedure Laterality Date  . AUGMENTATION MAMMAPLASTY    . BREAST ENHANCEMENT SURGERY    . CESAREAN SECTION    . EXAM UNDER ANESTHESIA WITH MANIPULATION OF KNEE Right 06/06/2020   Procedure: EXAM UNDER ANESTHESIA WITH MANIPULATION OF KNEE;  Surgeon: Eldred Manges, MD;  Location: [facility];  Service: Orthopedics;  Laterality: Right;  . HARDWARE REMOVAL Right 06/06/2020   Procedure: right knee wire removal;  Surgeon: Eldred Manges, MD;  Location: [facility];  Service: Orthopedics;  Laterality: Right;  . HYSTEROSCOPY    . ORIF PATELLA Right 04/20/2020   Procedure: OPEN REDUCTION INTERNAL (ORIF) FIXATION RIGHT PATELLA;  Surgeon: Eldred Manges, MD;  [facility];  Service: Orthopedics;  Laterality: Right;    There were no vitals filed for this visit.   Subjective Assessment - 07/12/20 1110    Subjective Pt. indicated bending knee a bit  more today.    Pertinent History ORIF Rt patella 04/20/2020, wire removal 06/06/2020    Limitations Walking;Standing    Patient Stated Goals Get back to walking, hiking, normal exercise    Currently in Pain? No/denies    Pain Score 0-No pain                             [facility] Adult PT Treatment/Exercise - 07/12/20 0001      Ambulation/Gait   Gait Comments SPC instruction and demonstration in Lt UE.  Evident DF limitation, knee hyperextension noted in mid to late stance on Rt LE      Knee/Hip Exercises: Stretches   Gastroc Stretch 3 reps;30 seconds;Right   incline board   Other Knee/Hip Stretches DF on step 2 x 10( added to HEP)      Knee/Hip Exercises: Aerobic   Nustep Lvl 5 10 mins       Knee/Hip Exercises: Machines for Strengthening   Total Gym Leg Press Rt SL 3 x 10 50 lbs      Knee/Hip Exercises: Standing   Lateral Step Up 20 reps;Hand Hold: 2;Step Height: 4";Both    Other Standing Knee Exercises wobble board fwd/back for ankle mobility 30 x       Vasopneumatic  Number Minutes Vasopneumatic  10 minutes    Vasopnuematic Location  Knee    Vasopneumatic Pressure Medium    Vasopneumatic Temperature  34      Manual Therapy   Manual therapy comments seated distraction, IR, flexion, ap mobs Rt talocrural jt g4                     PT Short Term Goals - 07/06/20 1212      PT SHORT TERM GOAL #1   Title Patient will demonstrate independent use of home exercise program to maintain progress from in clinic treatments.    Time 3    Period Weeks    Status Achieved    Target Date 07/12/20             PT Long Term Goals - 06/21/20 1500      PT LONG TERM GOAL #1   Title Patient will demonstrate/report pain at worst less than or equal to 2/10 to facilitate minimal limitation in daily activity secondary to pain symptoms.    Time 10    Period Weeks    Status New    Target Date 08/30/20      PT LONG TERM GOAL #2   Title Patient will demonstrate  independent use of home exercise program to facilitate ability to maintain/progress functional gains from skilled physical therapy services.    Time 10    Period Weeks    Status New    Target Date 08/30/20      PT LONG TERM GOAL #3   Title Pt. will demonstrate independent ambulation community distances s restriction.    Time 10    Period Weeks    Status New    Target Date 08/30/20      PT LONG TERM GOAL #4   Title Pt. will demonstrate Rt LE MMT 5/5 throughout to facilitate squats, stairs, standing, walking at [facility] s limitation.    Time 10    Period Weeks    Status New    Target Date 08/30/20      PT LONG TERM GOAL #5   Title Pt. will demonstrate Rt knee AROM 0-120 deg to facilitate usual transfers, squat, kneeling at PLOF s limitation.    Time 10    Period Weeks    Status New    Target Date 08/30/20                 Plan - 07/12/20 1141    Clinical Impression Statement Evivident DF restriction noted in gait, preventing ability to transition to step through gait pattern.  Hyperextension in stance also observed during moment of DF restriction.  Pt. continued to demonstrate improved quality of Rt knee flexion close to 90 deg today c minimal restriction in mid range.  Continued skilled PT services indicated.    Personal Factors and Comorbidities Comorbidity 3+    Comorbidities osteoporosis, HTN, hyperlipidemia    Examination-Activity Limitations Sit;Sleep;Squat;Stairs;Stand;Locomotion Level;Lift;Transfers    Examination-Participation Restrictions Community Activity    Stability/Clinical Decision Making Evolving/Moderate complexity    Rehab Potential Good    PT Frequency --   1-2x/week   PT Duration --   10wk   PT Treatment/Interventions ADLs/Self Care Home Management;Electrical Stimulation;Iontophoresis 4mg /ml Dexamethasone;Moist Heat;Neuromuscular re-education;Ultrasound;Stair training;Functional mobility training;Gait training;Patient/family education;Therapeutic  activities;Therapeutic exercise;Balance training;Manual techniques;Vasopneumatic Device;Taping;Passive range of motion;Dry needling;Spinal Manipulations;Joint Manipulations    PT Next Visit Plan ANKLE DF mobility, knee flexion mobility, WB strengthening/balance.    PT Home Exercise Plan [email or web address]  Consulted and Agree with Plan of Care Patient           Patient will benefit from skilled therapeutic intervention in order to improve the following deficits and impairments:  Abnormal gait, Hypomobility, Decreased activity tolerance, Decreased strength, Pain, Difficulty walking, Decreased mobility, Decreased balance, Decreased range of motion, Impaired perceived functional ability, Impaired flexibility, Decreased coordination  Visit Diagnosis: Acute pain of right knee  Muscle weakness (generalized)  Stiffness of right knee, not elsewhere classified  Difficulty in walking, not elsewhere classified  Localized edema     Problem List Patient Active Problem List   Diagnosis Date Noted  . Displaced transverse fracture of left patella, subsequent encounter for closed fracture with delayed healing   . Right patella fracture 04/20/2020    Chyrel Masson, PT, DPT, OCS, ATC 07/12/20  11:43 AM    [facility] Physical Therapy [address] [place], [place], [address] Phone: [phone]   Fax:  [phone]  Name: Cheryl Glass MRN: [id number] Date of Birth: Mar 02, 1951

## 2020-07-17 ENCOUNTER — Encounter: Payer: Self-pay | Admitting: Rehabilitative and Restorative Service Providers"

## 2020-07-17 ENCOUNTER — Other Ambulatory Visit: Payer: Self-pay

## 2020-07-17 ENCOUNTER — Ambulatory Visit: Payer: Medicare HMO | Admitting: Rehabilitative and Restorative Service Providers"

## 2020-07-17 DIAGNOSIS — M25661 Stiffness of right knee, not elsewhere classified: Secondary | ICD-10-CM | POA: Diagnosis not present

## 2020-07-17 DIAGNOSIS — R262 Difficulty in walking, not elsewhere classified: Secondary | ICD-10-CM | POA: Diagnosis not present

## 2020-07-17 DIAGNOSIS — M6281 Muscle weakness (generalized): Secondary | ICD-10-CM | POA: Diagnosis not present

## 2020-07-17 DIAGNOSIS — R600 Localized edema: Secondary | ICD-10-CM

## 2020-07-17 DIAGNOSIS — M25561 Pain in right knee: Secondary | ICD-10-CM | POA: Diagnosis not present

## 2020-07-17 NOTE — Therapy (Signed)
[facility] [address] [place], [place], [address] Phone: [phone]   Fax:  [phone]  Physical Therapy Treatment  Patient Details  Name: Cheryl Glass MRN: [id number] Date of Birth: [birth year] Referring Provider (PT): Dr. Ophelia Charter   Encounter Date: 07/17/2020   PT End of Session - 07/17/20 0857    Visit Number 6    Number of Visits 12    Date for PT Re-Evaluation 08/30/20    Authorization Type [employer]    Authorization Time Period 06/21/2020-08/30/2020    Authorization - Visit Number 6    Authorization - Number of Visits 12    Progress Note Due on Visit 10    PT Start Time 0842    PT Stop Time 0932    PT Time Calculation (min) 50 min    Activity Tolerance Patient tolerated treatment well    Behavior During Therapy [facility] for tasks assessed/performed           Past Medical History:  Diagnosis Date  . Arthritis   . Eating disorder   . Hyperlipidemia   . Hypertension   . Osteoporosis   . Right patella fracture   . Wrist fracture 2013   right    Past Surgical History:  Procedure Laterality Date  . AUGMENTATION MAMMAPLASTY    . BREAST ENHANCEMENT SURGERY    . CESAREAN SECTION    . EXAM UNDER ANESTHESIA WITH MANIPULATION OF KNEE Right 06/06/2020   Procedure: EXAM UNDER ANESTHESIA WITH MANIPULATION OF KNEE;  Surgeon: Eldred Manges, MD;  Location: [facility];  Service: Orthopedics;  Laterality: Right;  . HARDWARE REMOVAL Right 06/06/2020   Procedure: right knee wire removal;  Surgeon: Eldred Manges, MD;  Location: [facility];  Service: Orthopedics;  Laterality: Right;  . HYSTEROSCOPY    . ORIF PATELLA Right 04/20/2020   Procedure: OPEN REDUCTION INTERNAL (ORIF) FIXATION RIGHT PATELLA;  Surgeon: Eldred Manges, MD;  [facility];  Service: Orthopedics;  Laterality: Right;    There were no vitals filed for this visit.   Subjective Assessment - 07/17/20 0855    Subjective Pt. stated swelling in ankle  notable and continued over the weekend.  Pt. indicated no pain in knee really. Pt. also indicated noted some white hard spots on knee cap show up in last few days that come and go, not painful.    Pertinent History ORIF Rt patella 04/20/2020, wire removal 06/06/2020    Limitations Walking;Standing    Patient Stated Goals Get back to walking, hiking, normal exercise    Currently in Pain? No/denies    Pain Score 0-No pain                             [facility] Adult PT Treatment/Exercise - 07/17/20 0001      Neuro Re-ed    Neuro Re-ed Details  tandem amb fwd 15 ft x 6 c CGA, one instance of min A to prevent LoB      Knee/Hip Exercises: Stretches   Gastroc Stretch 5 reps;30 seconds;Right    Other Knee/Hip Stretches DF on step x 10 Rt LE       Knee/Hip Exercises: Aerobic   Nustep Lvl 6 10 mins      Knee/Hip Exercises: Machines for Strengthening   Total Gym Leg Press Rt SL 3 x 10 25 lbs      Knee/Hip Exercises: Standing   Lateral Step Up Step Height:  mobility training;Gait training;Patient/family education;Therapeutic activities;Therapeutic exercise;Balance training;Manual techniques;Vasopneumatic Device;Taping;Passive range of motion;Dry needling;Spinal Manipulations;Joint Manipulations    PT Next Visit Plan ANKLE DF mobility, knee flexion mobility, WB strengthening/balance.    PT Home Exercise Plan [id number]    Consulted and Agree with Plan of Care Patient           Patient will benefit from skilled therapeutic intervention in order to improve the following deficits and impairments:  Abnormal gait, Hypomobility, Decreased activity tolerance, Decreased strength, Pain, Difficulty walking, Decreased mobility, Decreased balance, Decreased range of motion, Impaired perceived functional ability, Impaired flexibility, Decreased coordination  Visit Diagnosis: Acute pain of right knee  Muscle weakness (generalized)  Stiffness of right knee, not elsewhere classified  Difficulty in walking, not elsewhere classified  Localized edema     Problem List Patient Active Problem List   Diagnosis Date Noted  . Displaced transverse fracture of left patella, subsequent encounter for closed fracture with delayed healing   . Right patella fracture 04/20/2020    Chyrel Masson, PT, DPT, OCS, ATC 07/17/20  9:52 AM    [facility] Physical Therapy [address] [place], [place], [address] Phone: [phone]   Fax:  [phone]  Name: Cheryl Glass MRN: [id number] Date of Birth: 1950-12-22  [facility] [address] [place], [place], [address] Phone: [phone]   Fax:  [phone]  Physical Therapy Treatment  Patient Details  Name: Cheryl Glass MRN: [id number] Date of Birth: [birth year] Referring Provider (PT): Dr. Ophelia Charter   Encounter Date: 07/17/2020   PT End of Session - 07/17/20 0857    Visit Number 6    Number of Visits 12    Date for PT Re-Evaluation 08/30/20    Authorization Type [employer]    Authorization Time Period 06/21/2020-08/30/2020    Authorization - Visit Number 6    Authorization - Number of Visits 12    Progress Note Due on Visit 10    PT Start Time 0842    PT Stop Time 0932    PT Time Calculation (min) 50 min    Activity Tolerance Patient tolerated treatment well    Behavior During Therapy [facility] for tasks assessed/performed           Past Medical History:  Diagnosis Date  . Arthritis   . Eating disorder   . Hyperlipidemia   . Hypertension   . Osteoporosis   . Right patella fracture   . Wrist fracture 2013   right    Past Surgical History:  Procedure Laterality Date  . AUGMENTATION MAMMAPLASTY    . BREAST ENHANCEMENT SURGERY    . CESAREAN SECTION    . EXAM UNDER ANESTHESIA WITH MANIPULATION OF KNEE Right 06/06/2020   Procedure: EXAM UNDER ANESTHESIA WITH MANIPULATION OF KNEE;  Surgeon: Eldred Manges, MD;  Location: [facility];  Service: Orthopedics;  Laterality: Right;  . HARDWARE REMOVAL Right 06/06/2020   Procedure: right knee wire removal;  Surgeon: Eldred Manges, MD;  Location: [facility];  Service: Orthopedics;  Laterality: Right;  . HYSTEROSCOPY    . ORIF PATELLA Right 04/20/2020   Procedure: OPEN REDUCTION INTERNAL (ORIF) FIXATION RIGHT PATELLA;  Surgeon: Eldred Manges, MD;  [facility];  Service: Orthopedics;  Laterality: Right;    There were no vitals filed for this visit.   Subjective Assessment - 07/17/20 0855    Subjective Pt. stated swelling in ankle  notable and continued over the weekend.  Pt. indicated no pain in knee really. Pt. also indicated noted some white hard spots on knee cap show up in last few days that come and go, not painful.    Pertinent History ORIF Rt patella 04/20/2020, wire removal 06/06/2020    Limitations Walking;Standing    Patient Stated Goals Get back to walking, hiking, normal exercise    Currently in Pain? No/denies    Pain Score 0-No pain                             [facility] Adult PT Treatment/Exercise - 07/17/20 0001      Neuro Re-ed    Neuro Re-ed Details  tandem amb fwd 15 ft x 6 c CGA, one instance of min A to prevent LoB      Knee/Hip Exercises: Stretches   Gastroc Stretch 5 reps;30 seconds;Right    Other Knee/Hip Stretches DF on step x 10 Rt LE       Knee/Hip Exercises: Aerobic   Nustep Lvl 6 10 mins      Knee/Hip Exercises: Machines for Strengthening   Total Gym Leg Press Rt SL 3 x 10 25 lbs      Knee/Hip Exercises: Standing   Lateral Step Up Step Height:

## 2020-07-19 ENCOUNTER — Other Ambulatory Visit: Payer: Self-pay

## 2020-07-19 ENCOUNTER — Encounter: Payer: Self-pay | Admitting: Physical Therapy

## 2020-07-19 ENCOUNTER — Ambulatory Visit: Payer: Medicare HMO | Admitting: Physical Therapy

## 2020-07-19 DIAGNOSIS — M6281 Muscle weakness (generalized): Secondary | ICD-10-CM | POA: Diagnosis not present

## 2020-07-19 DIAGNOSIS — R600 Localized edema: Secondary | ICD-10-CM | POA: Diagnosis not present

## 2020-07-19 DIAGNOSIS — M25661 Stiffness of right knee, not elsewhere classified: Secondary | ICD-10-CM

## 2020-07-19 DIAGNOSIS — R262 Difficulty in walking, not elsewhere classified: Secondary | ICD-10-CM

## 2020-07-19 DIAGNOSIS — M25561 Pain in right knee: Secondary | ICD-10-CM

## 2020-07-19 NOTE — Therapy (Signed)
[facility] Therapy [address] [place], [place], [address] Phone: [phone]   Fax:  [phone]  Physical Therapy Treatment  Patient Details  Name: Cheryl Glass MRN: [id number] Date of Birth: 1950/12/14 Referring Provider (PT): Dr. Ophelia Charter   Encounter Date: 07/19/2020   PT End of Session - 07/19/20 0930    Visit Number 7    Number of Visits 12    Date for PT Re-Evaluation 08/30/20    Authorization Type [employer]    Authorization Time Period 06/21/2020-08/30/2020    Authorization - Visit Number 7    Authorization - Number of Visits 12    Progress Note Due on Visit 10    PT Start Time 0930    PT Stop Time 1025    PT Time Calculation (min) 55 min    Activity Tolerance Patient tolerated treatment well    Behavior During Therapy [facility] for tasks assessed/performed           Past Medical History:  Diagnosis Date  . Arthritis   . Eating disorder   . Hyperlipidemia   . Hypertension   . Osteoporosis   . Right patella fracture   . Wrist fracture 2013   right    Past Surgical History:  Procedure Laterality Date  . AUGMENTATION MAMMAPLASTY    . BREAST ENHANCEMENT SURGERY    . CESAREAN SECTION    . EXAM UNDER ANESTHESIA WITH MANIPULATION OF KNEE Right 06/06/2020   Procedure: EXAM UNDER ANESTHESIA WITH MANIPULATION OF KNEE;  Surgeon: Eldred Manges, MD;  Location: [facility];  Service: Orthopedics;  Laterality: Right;  . HARDWARE REMOVAL Right 06/06/2020   Procedure: right knee wire removal;  Surgeon: Eldred Manges, MD;  Location: [facility];  Service: Orthopedics;  Laterality: Right;  . HYSTEROSCOPY    . ORIF PATELLA Right 04/20/2020   Procedure: OPEN REDUCTION INTERNAL (ORIF) FIXATION RIGHT PATELLA;  Surgeon: Eldred Manges, MD;  [facility];  Service: Orthopedics;  Laterality: Right;    There were no vitals filed for this visit.   Subjective Assessment - 07/19/20 0930    Subjective Exercises are going well. Her  husband built a step on deck.    Pertinent History ORIF Rt patella 04/20/2020, wire removal 06/06/2020    Limitations Walking;Standing    Patient Stated Goals Get back to walking, hiking, normal exercise    Currently in Pain? No/denies                             [facility] Adult PT Treatment/Exercise - 07/19/20 0930      Neuro Re-ed    Neuro Re-ed Details  tandem stance on foam beam 60 sec ea with RLE in front & RLE in back.  PT demo how to use towel roll for home.  Standing crossways on towel roll static 60 seconds & head turns right/ left 10 tiimes & up/down 10 times      Knee/Hip Exercises: Stretches   Theme park manager 30 seconds;Right;3 reps    Gastroc Stretch Limitations foot over edge of step    Other Knee/Hip Stretches --      Knee/Hip Exercises: Aerobic   Nustep Lvl 6 10 mins      Knee/Hip Exercises: Machines for Strengthening   Total Gym Leg Press Rt SL 3 x 10 25 lbs      Knee/Hip Exercises: Standing   Lateral Step Up Hand Hold: 1;20 reps;Both;1 set;Step Height: 6"  Lateral Step Up Limitations cues on pelvis over foot for squat and wt shift over foot raising back up to step    Forward Step Up Right;20 reps;Hand Hold: 2;Step Height: 6"   rail & cane   Forward Step Up Limitations demo & verbal cues on weight over right foot & power up    Step Down Right;1 set;20 reps;Hand Hold: 2;Step Height: 6"   rail & cane   Step Down Limitations demo & verbal cues on tracking weight over foot & PF LLE reaching      Knee/Hip Exercises: Seated   Other Seated Knee/Hip Exercises --    Other Seated Knee/Hip Exercises --      Vasopneumatic   Number Minutes Vasopneumatic  10 minutes    Vasopnuematic Location  Ankle    Vasopneumatic Pressure Medium    Vasopneumatic Temperature  34      Manual Therapy   Manual therapy comments --                    PT Short Term Goals - 07/06/20 1212      PT SHORT TERM GOAL #1   Title Patient will demonstrate independent use  of home exercise program to maintain progress from in clinic treatments.    Time 3    Period Weeks    Status Achieved    Target Date 07/12/20             PT Long Term Goals - 06/21/20 1500      PT LONG TERM GOAL #1   Title Patient will demonstrate/report pain at worst less than or equal to 2/10 to facilitate minimal limitation in daily activity secondary to pain symptoms.    Time 10    Period Weeks    Status New    Target Date 08/30/20      PT LONG TERM GOAL #2   Title Patient will demonstrate independent use of home exercise program to facilitate ability to maintain/progress functional gains from skilled physical therapy services.    Time 10    Period Weeks    Status New    Target Date 08/30/20      PT LONG TERM GOAL #3   Title Pt. will demonstrate independent ambulation community distances s restriction.    Time 10    Period Weeks    Status New    Target Date 08/30/20      PT LONG TERM GOAL #4   Title Pt. will demonstrate Rt LE MMT 5/5 throughout to facilitate squats, stairs, standing, walking at [facility] s limitation.    Time 10    Period Weeks    Status New    Target Date 08/30/20      PT LONG TERM GOAL #5   Title Pt. will demonstrate Rt knee AROM 0-120 deg to facilitate usual transfers, squat, kneeling at PLOF s limitation.    Time 10    Period Weeks    Status New    Target Date 08/30/20                 Plan - 07/19/20 0939    Clinical Impression Statement PT updated HEP including balance activities.  Patient improved step up & step down activities with instruction in technique & weight shift.    Personal Factors and Comorbidities Comorbidity 3+    Comorbidities osteoporosis, HTN, hyperlipidemia    Examination-Activity Limitations Sit;Sleep;Squat;Stairs;Stand;Locomotion Level;Lift;Transfers    Examination-Participation Restrictions Community Activity    Stability/Clinical Decision Making  Evolving/Moderate complexity    Rehab Potential Good    PT  Frequency --   1-2x/week   PT Duration --   10wk   PT Treatment/Interventions ADLs/Self Care Home Management;Electrical Stimulation;Iontophoresis 4mg /ml Dexamethasone;Moist Heat;Neuromuscular re-education;Ultrasound;Stair training;Functional mobility training;Gait training;Patient/family education;Therapeutic activities;Therapeutic exercise;Balance training;Manual techniques;Vasopneumatic Device;Taping;Passive range of motion;Dry needling;Spinal Manipulations;Joint Manipulations    PT Next Visit Plan ANKLE DF mobility, knee flexion mobility, WB strengthening/balance.    PT Home Exercise Plan [id number]    Consulted and Agree with Plan of Care Patient           Patient will benefit from skilled therapeutic intervention in order to improve the following deficits and impairments:  Abnormal gait, Hypomobility, Decreased activity tolerance, Decreased strength, Pain, Difficulty walking, Decreased mobility, Decreased balance, Decreased range of motion, Impaired perceived functional ability, Impaired flexibility, Decreased coordination  Visit Diagnosis: Acute pain of right knee  Muscle weakness (generalized)  Stiffness of right knee, not elsewhere classified  Difficulty in walking, not elsewhere classified  Localized edema     Problem List Patient Active Problem List   Diagnosis Date Noted  . Displaced transverse fracture of left patella, subsequent encounter for closed fracture with delayed healing   . Right patella fracture 04/20/2020    Vladimir Faster PT, DPT 07/19/2020, 2:59 PM  [facility] Physical Therapy [address] [place], [place], [address] Phone: [phone]   Fax:  [phone]  Name: Cheryl Glass MRN: [id number] Date of Birth: [birth year]

## 2020-07-24 ENCOUNTER — Ambulatory Visit: Payer: Medicare HMO | Admitting: Rehabilitative and Restorative Service Providers"

## 2020-07-24 ENCOUNTER — Other Ambulatory Visit: Payer: Self-pay

## 2020-07-24 ENCOUNTER — Encounter: Payer: Self-pay | Admitting: Rehabilitative and Restorative Service Providers"

## 2020-07-24 DIAGNOSIS — R600 Localized edema: Secondary | ICD-10-CM

## 2020-07-24 DIAGNOSIS — M6281 Muscle weakness (generalized): Secondary | ICD-10-CM | POA: Diagnosis not present

## 2020-07-24 DIAGNOSIS — M25561 Pain in right knee: Secondary | ICD-10-CM | POA: Diagnosis not present

## 2020-07-24 DIAGNOSIS — M25661 Stiffness of right knee, not elsewhere classified: Secondary | ICD-10-CM | POA: Diagnosis not present

## 2020-07-24 DIAGNOSIS — R262 Difficulty in walking, not elsewhere classified: Secondary | ICD-10-CM

## 2020-07-24 NOTE — Therapy (Signed)
[facility] Physical Therapy [address] [place], [place], [address] Phone: [phone]   Fax:  [phone]  Physical Therapy Treatment  Patient Details  Name: Cheryl Glass MRN: [id number] Date of Birth: 05/02/1951 Referring Provider (PT): Dr. Ophelia Charter   Encounter Date: 07/24/2020   PT End of Session - 07/24/20 0937    Visit Number 8    Number of Visits 12    Date for PT Re-Evaluation 08/30/20    Authorization Type [employer]    Authorization Time Period 06/21/2020-08/30/2020    Authorization - Visit Number 8    Authorization - Number of Visits 12    Progress Note Due on Visit 10    PT Start Time 0925    PT Stop Time 1016    PT Time Calculation (min) 51 min    Activity Tolerance Patient tolerated treatment well    Behavior During Therapy [facility] for tasks assessed/performed           Past Medical History:  Diagnosis Date  . Arthritis   . Eating disorder   . Hyperlipidemia   . Hypertension   . Osteoporosis   . Right patella fracture   . Wrist fracture 2013   right    Past Surgical History:  Procedure Laterality Date  . AUGMENTATION MAMMAPLASTY    . BREAST ENHANCEMENT SURGERY    . CESAREAN SECTION    . EXAM UNDER ANESTHESIA WITH MANIPULATION OF KNEE Right 06/06/2020   Procedure: EXAM UNDER ANESTHESIA WITH MANIPULATION OF KNEE;  Surgeon: Eldred Manges, MD;  Location: [facility];  Service: Orthopedics;  Laterality: Right;  . HARDWARE REMOVAL Right 06/06/2020   Procedure: right knee wire removal;  Surgeon: Eldred Manges, MD;  Location: [facility];  Service: Orthopedics;  Laterality: Right;  . HYSTEROSCOPY    . ORIF PATELLA Right 04/20/2020   Procedure: OPEN REDUCTION INTERNAL (ORIF) FIXATION RIGHT PATELLA;  Surgeon: Eldred Manges, MD;  [facility];  Service: Orthopedics;  Laterality: Right;    There were no vitals filed for this visit.   Subjective Assessment - 07/24/20 0937    Subjective Pt. indicated tightness in  anterior knee in flexion movement.  Pt. stated having improving in ankle feeling.    Pertinent History ORIF Rt patella 04/20/2020, wire removal 06/06/2020    Limitations Walking;Standing    Patient Stated Goals Get back to walking, hiking, normal exercise    Currently in Pain? No/denies              [facility] PT Assessment - 07/24/20 0001      Assessment   Medical Diagnosis Rt ORIF patella, wire removal     Referring Provider (PT) Dr. Ophelia Charter    Onset Date/Surgical Date 04/20/20      AROM   Right Knee Extension 0      PROM   Right Knee Extension 0    Right Knee Flexion 95                         [facility] Adult PT Treatment/Exercise - 07/24/20 0001      Neuro Re-ed    Neuro Re-ed Details  tandem amb foam 8 ft x 10 fwd in // bars, tandem stance on foam 1 min x 1 bilateral,  3 lateral cone stepping x 6 each bilateral      Knee/Hip Exercises: Stretches   Gastroc Stretch 30 seconds;3 reps;Both      Knee/Hip Exercises: Machines for Strengthening  Total Gym Leg Press Rt SL 3 x 15 25 lbs      Knee/Hip Exercises: Seated   Other Seated Knee/Hip Exercises tailgate flexion stretch for home use instruction    Sit to Sand 20 reps;without UE support   20 inch chair     Vasopneumatic   Number Minutes Vasopneumatic  10 minutes    Vasopnuematic Location  Ankle    Vasopneumatic Pressure Medium    Vasopneumatic Temperature  34      Manual Therapy   Manual therapy comments Seated Rt knee flexion c MWM c IR, contract/relax to Rt quad, Rt ankle df MWM c ap talocrural jt mob                    PT Short Term Goals - 07/06/20 1212      PT SHORT TERM GOAL #1   Title Patient will demonstrate independent use of home exercise program to maintain progress from in clinic treatments.    Time 3    Period Weeks    Status Achieved    Target Date 07/12/20             PT Long Term Goals - 07/24/20 1008      PT LONG TERM GOAL #1   Title Patient will demonstrate/report pain  at worst less than or equal to 2/10 to facilitate minimal limitation in daily activity secondary to pain symptoms.    Time 10    Period Weeks    Status Achieved      PT LONG TERM GOAL #2   Title Patient will demonstrate independent use of home exercise program to facilitate ability to maintain/progress functional gains from skilled physical therapy services.    Time 10    Period Weeks    Status On-going      PT LONG TERM GOAL #3   Title Pt. will demonstrate independent ambulation community distances s restriction.    Time 10    Period Weeks    Status On-going      PT LONG TERM GOAL #4   Title Pt. will demonstrate Rt LE MMT 5/5 throughout to facilitate squats, stairs, standing, walking at [facility] s limitation.    Time 10    Period Weeks    Status On-going      PT LONG TERM GOAL #5   Title Pt. will demonstrate Rt knee AROM 0-120 deg to facilitate usual transfers, squat, kneeling at PLOF s limitation.    Time 10    Period Weeks    Status On-going                 Plan - 07/24/20 1610    Clinical Impression Statement Pt. has demonstrated improvement in stance phase on Rt LE c SPC use at this time, though antalgic gait still noted.  Transitioning to WB strengthening and movement coordination c compliant surface and dynamic stabilty appropriate at this time (fair performance control today).    Personal Factors and Comorbidities Comorbidity 3+    Comorbidities osteoporosis, HTN, hyperlipidemia    Examination-Activity Limitations Sit;Sleep;Squat;Stairs;Stand;Locomotion Level;Lift;Transfers    Examination-Participation Restrictions Community Activity    Stability/Clinical Decision Making Evolving/Moderate complexity    Rehab Potential Good    PT Frequency --   1-2x/week   PT Duration --   10wk   PT Treatment/Interventions ADLs/Self Care Home Management;Electrical Stimulation;Iontophoresis 4mg /ml Dexamethasone;Moist Heat;Neuromuscular re-education;Ultrasound;Stair  training;Functional mobility training;Gait training;Patient/family education;Therapeutic activities;Therapeutic exercise;Balance training;Manual techniques;Vasopneumatic Device;Taping;Passive range of motion;Dry needling;Spinal Manipulations;Joint Manipulations  PT Next Visit Plan Continued balance control improvements, DF mobility gains, knee flexion mobility gains, WB strengthening.    PT Home Exercise Plan [id number]    Consulted and Agree with Plan of Care Patient           Patient will benefit from skilled therapeutic intervention in order to improve the following deficits and impairments:  Abnormal gait, Hypomobility, Decreased activity tolerance, Decreased strength, Pain, Difficulty walking, Decreased mobility, Decreased balance, Decreased range of motion, Impaired perceived functional ability, Impaired flexibility, Decreased coordination  Visit Diagnosis: Acute pain of right knee  Muscle weakness (generalized)  Stiffness of right knee, not elsewhere classified  Difficulty in walking, not elsewhere classified  Localized edema     Problem List Patient Active Problem List   Diagnosis Date Noted  . Displaced transverse fracture of left patella, subsequent encounter for closed fracture with delayed healing   . Right patella fracture 04/20/2020    Chyrel Masson, PT, DPT, OCS, ATC 07/24/20  10:10 AM    [facility] Physical Therapy [address] [place], [place], [address] Phone: [phone]   Fax:  [phone]  Name: Lexee Boldon MRN: [id number] Date of Birth: 04-13-1951

## 2020-07-26 ENCOUNTER — Other Ambulatory Visit: Payer: Self-pay

## 2020-07-26 ENCOUNTER — Encounter: Payer: Self-pay | Admitting: Rehabilitative and Restorative Service Providers"

## 2020-07-26 ENCOUNTER — Ambulatory Visit: Payer: Medicare HMO | Admitting: Rehabilitative and Restorative Service Providers"

## 2020-07-26 DIAGNOSIS — M25661 Stiffness of right knee, not elsewhere classified: Secondary | ICD-10-CM

## 2020-07-26 DIAGNOSIS — M25561 Pain in right knee: Secondary | ICD-10-CM | POA: Diagnosis not present

## 2020-07-26 DIAGNOSIS — M6281 Muscle weakness (generalized): Secondary | ICD-10-CM

## 2020-07-26 DIAGNOSIS — R262 Difficulty in walking, not elsewhere classified: Secondary | ICD-10-CM

## 2020-07-26 DIAGNOSIS — R600 Localized edema: Secondary | ICD-10-CM

## 2020-07-26 NOTE — Therapy (Signed)
[facility] Physical Therapy [address] [place], [place], [address] Phone: [phone]   Fax:  [phone]  Physical Therapy Treatment  Patient Details  Name: Cheryl Glass MRN: [id number] Date of Birth: [birth year] Referring Provider (PT): Dr. Ophelia Charter   Encounter Date: 07/26/2020   PT End of Session - 07/26/20 0846    Visit Number 9    Number of Visits 12    Date for PT Re-Evaluation 08/30/20    Authorization Type [employer]    Authorization Time Period 06/21/2020-08/30/2020    Authorization - Visit Number 9    Authorization - Number of Visits 12    Progress Note Due on Visit 10    PT Start Time 0841    PT Stop Time 0930    PT Time Calculation (min) 49 min    Activity Tolerance Patient tolerated treatment well    Behavior During Therapy [facility] for tasks assessed/performed           Past Medical History:  Diagnosis Date  . Arthritis   . Eating disorder   . Hyperlipidemia   . Hypertension   . Osteoporosis   . Right patella fracture   . Wrist fracture 2013   right    Past Surgical History:  Procedure Laterality Date  . AUGMENTATION MAMMAPLASTY    . BREAST ENHANCEMENT SURGERY    . CESAREAN SECTION    . EXAM UNDER ANESTHESIA WITH MANIPULATION OF KNEE Right 06/06/2020   Procedure: EXAM UNDER ANESTHESIA WITH MANIPULATION OF KNEE;  Surgeon: Eldred Manges, MD;  Location: [facility];  Service: Orthopedics;  Laterality: Right;  . HARDWARE REMOVAL Right 06/06/2020   Procedure: right knee wire removal;  Surgeon: Eldred Manges, MD;  Location: [facility];  Service: Orthopedics;  Laterality: Right;  . HYSTEROSCOPY    . ORIF PATELLA Right 04/20/2020   Procedure: OPEN REDUCTION INTERNAL (ORIF) FIXATION RIGHT PATELLA;  Surgeon: Eldred Manges, MD;  [facility];  Service: Orthopedics;  Laterality: Right;    There were no vitals filed for this visit.   Subjective Assessment - 07/26/20 0845    Subjective Pt. reported doing ok today.   Pt. stated getting up in morning is stiffness.    Pertinent History ORIF Rt patella 04/20/2020, wire removal 06/06/2020    Limitations Walking;Standing    Patient Stated Goals Get back to walking, hiking, normal exercise    Currently in Pain? No/denies    Pain Score 0-No pain    Pain Location Knee    Pain Orientation Right    Pain Descriptors / Indicators Tightness    Pain Frequency Occasional    Aggravating Factors  stiffness in morning                             [facility] Adult PT Treatment/Exercise - 07/26/20 0001      Neuro Re-ed    Neuro Re-ed Details  wobble board fwd/back df/pf 30x each, SLS on airex foam 1 min attempts x 2 bilateral c occasional HHA,       Knee/Hip Exercises: Stretches   Gastroc Stretch 30 seconds;3 reps   incline board bilateral     Knee/Hip Exercises: Aerobic   Nustep Lvl 6 11 mins      Knee/Hip Exercises: Machines for Strengthening   Total Gym Leg Press Rt SL 3 x 15 31 lbs      Knee/Hip Exercises: Standing   Forward Step Up Step Height:  Patient will benefit from skilled therapeutic intervention in order to improve the following deficits and impairments:  Abnormal gait, Hypomobility, Decreased activity tolerance, Decreased strength, Pain, Difficulty walking, Decreased mobility, Decreased balance, Decreased range of motion, Impaired perceived functional ability, Impaired flexibility, Decreased coordination  Visit Diagnosis: Acute pain of right knee  Muscle weakness (generalized)  Stiffness of right knee, not elsewhere classified  Difficulty in walking, not elsewhere classified  Localized edema     Problem List Patient Active Problem List   Diagnosis Date Noted  . Displaced transverse fracture of left patella, subsequent encounter for closed fracture with delayed healing   . Right patella fracture 04/20/2020    Chyrel Masson, PT, DPT, OCS, ATC 07/26/20  9:26 AM    [facility] Physical Therapy [address] [place], [place], [address] Phone: [phone]   Fax:  [phone]  Name: Jennifier Smitherman MRN: [id number] Date of Birth: [birth year]  [facility] Physical Therapy [address] [place], [place], [address] Phone: [phone]   Fax:  [phone]  Physical Therapy Treatment  Patient Details  Name: Cheryl Glass MRN: [id number] Date of Birth: [birth year] Referring Provider (PT): Dr. Ophelia Charter   Encounter Date: 07/26/2020   PT End of Session - 07/26/20 0846    Visit Number 9    Number of Visits 12    Date for PT Re-Evaluation 08/30/20    Authorization Type [employer]    Authorization Time Period 06/21/2020-08/30/2020    Authorization - Visit Number 9    Authorization - Number of Visits 12    Progress Note Due on Visit 10    PT Start Time 0841    PT Stop Time 0930    PT Time Calculation (min) 49 min    Activity Tolerance Patient tolerated treatment well    Behavior During Therapy [facility] for tasks assessed/performed           Past Medical History:  Diagnosis Date  . Arthritis   . Eating disorder   . Hyperlipidemia   . Hypertension   . Osteoporosis   . Right patella fracture   . Wrist fracture 2013   right    Past Surgical History:  Procedure Laterality Date  . AUGMENTATION MAMMAPLASTY    . BREAST ENHANCEMENT SURGERY    . CESAREAN SECTION    . EXAM UNDER ANESTHESIA WITH MANIPULATION OF KNEE Right 06/06/2020   Procedure: EXAM UNDER ANESTHESIA WITH MANIPULATION OF KNEE;  Surgeon: Eldred Manges, MD;  Location: [facility];  Service: Orthopedics;  Laterality: Right;  . HARDWARE REMOVAL Right 06/06/2020   Procedure: right knee wire removal;  Surgeon: Eldred Manges, MD;  Location: [facility];  Service: Orthopedics;  Laterality: Right;  . HYSTEROSCOPY    . ORIF PATELLA Right 04/20/2020   Procedure: OPEN REDUCTION INTERNAL (ORIF) FIXATION RIGHT PATELLA;  Surgeon: Eldred Manges, MD;  [facility];  Service: Orthopedics;  Laterality: Right;    There were no vitals filed for this visit.   Subjective Assessment - 07/26/20 0845    Subjective Pt. reported doing ok today.   Pt. stated getting up in morning is stiffness.    Pertinent History ORIF Rt patella 04/20/2020, wire removal 06/06/2020    Limitations Walking;Standing    Patient Stated Goals Get back to walking, hiking, normal exercise    Currently in Pain? No/denies    Pain Score 0-No pain    Pain Location Knee    Pain Orientation Right    Pain Descriptors / Indicators Tightness    Pain Frequency Occasional    Aggravating Factors  stiffness in morning                             [facility] Adult PT Treatment/Exercise - 07/26/20 0001      Neuro Re-ed    Neuro Re-ed Details  wobble board fwd/back df/pf 30x each, SLS on airex foam 1 min attempts x 2 bilateral c occasional HHA,       Knee/Hip Exercises: Stretches   Gastroc Stretch 30 seconds;3 reps   incline board bilateral     Knee/Hip Exercises: Aerobic   Nustep Lvl 6 11 mins      Knee/Hip Exercises: Machines for Strengthening   Total Gym Leg Press Rt SL 3 x 15 31 lbs      Knee/Hip Exercises: Standing   Forward Step Up Step Height:

## 2020-07-31 ENCOUNTER — Encounter: Payer: Self-pay | Admitting: Physical Therapy

## 2020-07-31 ENCOUNTER — Encounter: Payer: Medicare HMO | Admitting: Rehabilitative and Restorative Service Providers"

## 2020-07-31 ENCOUNTER — Encounter: Payer: Self-pay | Admitting: Orthopaedic Surgery

## 2020-07-31 ENCOUNTER — Ambulatory Visit: Payer: Self-pay

## 2020-07-31 ENCOUNTER — Other Ambulatory Visit: Payer: Self-pay

## 2020-07-31 ENCOUNTER — Ambulatory Visit: Payer: Medicare HMO | Admitting: Physical Therapy

## 2020-07-31 ENCOUNTER — Ambulatory Visit (INDEPENDENT_AMBULATORY_CARE_PROVIDER_SITE_OTHER): Payer: Medicare HMO | Admitting: Orthopaedic Surgery

## 2020-07-31 VITALS — Ht 62.0 in | Wt 113.0 lb

## 2020-07-31 DIAGNOSIS — M25571 Pain in right ankle and joints of right foot: Secondary | ICD-10-CM

## 2020-07-31 DIAGNOSIS — M25561 Pain in right knee: Secondary | ICD-10-CM

## 2020-07-31 DIAGNOSIS — R600 Localized edema: Secondary | ICD-10-CM | POA: Diagnosis not present

## 2020-07-31 DIAGNOSIS — M25661 Stiffness of right knee, not elsewhere classified: Secondary | ICD-10-CM | POA: Diagnosis not present

## 2020-07-31 DIAGNOSIS — M6281 Muscle weakness (generalized): Secondary | ICD-10-CM

## 2020-07-31 DIAGNOSIS — G8929 Other chronic pain: Secondary | ICD-10-CM

## 2020-07-31 DIAGNOSIS — R262 Difficulty in walking, not elsewhere classified: Secondary | ICD-10-CM

## 2020-07-31 NOTE — Therapy (Signed)
program to facilitate ability to maintain/progress functional gains from skilled physical therapy services.    Status On-going      PT LONG TERM GOAL #3   Title Pt. will demonstrate independent ambulation community distances s restriction.    Baseline Pt still amb with cane and reporting difficulties with uneven surfaces (for example parking lots on incline)    Period Weeks    Status On-going      PT LONG TERM GOAL #4   Title Pt. will demonstrate Rt LE MMT 5/5 throughout to facilitate squats, stairs, standing, walking at [facility] s limitation.    Status On-going      PT LONG TERM GOAL #5   Title Pt. will demonstrate Rt knee AROM 0-120 deg to facilitate usual transfers, squat, kneeling at PLOF s limitation.    Status On-going                 Plan - 07/31/20 1105    Clinical Impression Statement Pt tolerating treatment well. Pt still with limited mobility in knee flexion and ankle mobility. Pt having difficulty with stair  navigation up and down steps. Continuing toward goals set and improved dynamic balance.    Personal Factors and Comorbidities Comorbidity 3+    Comorbidities osteoporosis, HTN, hyperlipidemia    Examination-Activity Limitations Sit;Sleep;Squat;Stairs;Stand;Locomotion Level;Lift;Transfers    Stability/Clinical Decision Making Evolving/Moderate complexity    Rehab Potential Good    PT Treatment/Interventions ADLs/Self Care Home Management;Electrical Stimulation;Iontophoresis 4mg /ml Dexamethasone;Moist Heat;Neuromuscular re-education;Ultrasound;Stair training;Functional mobility training;Gait training;Patient/family education;Therapeutic activities;Therapeutic exercise;Balance training;Manual techniques;Vasopneumatic Device;Taping;Passive range of motion;Dry needling;Spinal Manipulations;Joint Manipulations    PT Next Visit Plan Continued balance control improvements, DF mobility gains, knee flexion mobility gains, WB strengthening.    PT Home Exercise Plan [id number]    Consulted and Agree with Plan of Care Patient           Patient will benefit from skilled therapeutic intervention in order to improve the following deficits and impairments:  Abnormal gait, Hypomobility, Decreased activity tolerance, Decreased strength, Pain, Difficulty walking, Decreased mobility, Decreased balance, Decreased range of motion, Impaired perceived functional ability, Impaired flexibility, Decreased coordination  Visit Diagnosis: Acute pain of right knee  Muscle weakness (generalized)  Stiffness of right knee, not elsewhere classified  Difficulty in walking, not elsewhere classified  Localized edema     Problem List Patient Active Problem List   Diagnosis Date Noted  . Displaced transverse fracture of left patella, subsequent encounter for closed fracture with delayed healing   . Right patella fracture 04/20/2020    04/22/2020, PT, MPT 07/31/2020, 11:33 AM  [facility] Physical  Therapy [address] [place], [place], [place] Phone: [phone]   Fax:  [phone]  Name: Cheryl Glass MRN: [name] Date of Birth: [birth year]  [facility] Physical Therapy [address] [place], [place], [address] Phone: [phone]   Fax:  [phone]  Physical Therapy Treatment Progress Note  Patient Details  Name: Cheryl Glass MRN: [id number] Date of Birth: Mar 04, 1951 Referring Provider (PT): Dr. Ophelia Charter   Encounter Date: 07/31/2020   PT End of Session - 07/31/20 1105    Visit Number 10    Number of Visits 12    Date for PT Re-Evaluation 08/30/20    Authorization Type [employer]    Authorization Time Period 06/21/2020-08/30/2020    Authorization - Visit Number 10    Authorization - Number of Visits 12    Progress Note Due on Visit 20    PT Start Time 1101    PT Stop Time 1131    PT Time Calculation (min) 30 min    Activity Tolerance Patient tolerated treatment well    Behavior During Therapy [facility] for tasks assessed/performed           Past Medical History:  Diagnosis Date  . Arthritis   . Eating disorder   . Hyperlipidemia   . Hypertension   . Osteoporosis   . Right patella fracture   . Wrist fracture 2013   right    Past Surgical History:  Procedure Laterality Date  . AUGMENTATION MAMMAPLASTY    . BREAST ENHANCEMENT SURGERY    . CESAREAN SECTION    . EXAM UNDER ANESTHESIA WITH MANIPULATION OF KNEE Right 06/06/2020   Procedure: EXAM UNDER ANESTHESIA WITH MANIPULATION OF KNEE;  Surgeon: Eldred Manges, MD;  Location: [facility];  Service: Orthopedics;  Laterality: Right;  . HARDWARE REMOVAL Right 06/06/2020   Procedure: right knee wire removal;  Surgeon: Eldred Manges, MD;  Location: [facility];  Service: Orthopedics;  Laterality: Right;  . HYSTEROSCOPY    . ORIF PATELLA Right 04/20/2020   Procedure: OPEN REDUCTION INTERNAL (ORIF) FIXATION RIGHT PATELLA;  Surgeon: Eldred Manges, MD;  [facility];  Service: Orthopedics;  Laterality: Right;    There were no vitals filed for this visit.   Subjective Assessment - 07/31/20 1103    Subjective Pt reporting  she needs to leave at 11:30 day cutting the treatment short by 15 minutes due to another appointment. Pt reporting no pain.    Pertinent History ORIF Rt patella 04/20/2020, wire removal 06/06/2020    Patient Stated Goals Get back to walking, hiking, normal exercise    Currently in Pain? No/denies                                       PT Short Term Goals - 07/06/20 1212      PT SHORT TERM GOAL #1   Title Patient will demonstrate independent use of home exercise program to maintain progress from in clinic treatments.    Time 3    Period Weeks    Status Achieved    Target Date 07/12/20             PT Long Term Goals - 07/31/20 1107      PT LONG TERM GOAL #1   Title Patient will demonstrate/report pain at worst less than or equal to 2/10 to facilitate minimal limitation in daily activity secondary to pain symptoms.    Status Achieved      PT LONG TERM GOAL #2   Title Patient will demonstrate independent use of home exercise

## 2020-07-31 NOTE — Progress Notes (Signed)
Post-Op Visit Note   Patient: Cheryl Glass           Date of Birth: October 24, 1950           MRN: [id number] Visit Date: 07/31/2020 PCP: Sigmund Hazel, MD   Assessment & Plan: Post-ORIF patella.  Wire removal 06/06/2020.  She is walking with a cane she has about a 4 degree extension lag.  She is flexing to 90 easily and is working on crossing her ankle pulling her knee back and holding pressure to help stretch and increase her flexion.  She is made good progress with therapy I plan to check her back for final visit in 2 months.  Told her I was very proud of her progress.  Chief Complaint:  Chief Complaint  Patient presents with  . Right Knee - Follow-up    06/06/2020 right knee wire removal  04/20/2020 ORIF Right patella   Visit Diagnoses:  1. Chronic pain of right knee   2. Pain in right ankle and joints of right foot     Plan: Continue knee range of motion.  With further strengthening of her quad should be able to get rid of the cane.  She only has a few degrees of extension lag and is made excellent progress.  Recheck 2 months  Follow-Up Instructions: No follow-ups on file.   Orders:  Orders Placed This Encounter  Procedures  . XR Knee 1-2 Views Right  . XR Ankle Complete Right   No orders of the defined types were placed in this encounter.   Imaging: No results found.  PMFS History: Patient Active Problem List   Diagnosis Date Noted  . Displaced transverse fracture of left patella, subsequent encounter for closed fracture with delayed healing   . Right patella fracture 04/20/2020   Past Medical History:  Diagnosis Date  . Arthritis   . Eating disorder   . Hyperlipidemia   . Hypertension   . Osteoporosis   . Right patella fracture   . Wrist fracture 2013   right    Family History  Problem Relation Age of Onset  . Thyroid disease Sister        hypothyroid  . Osteoporosis Maternal Grandmother   . Cancer Father        pancreatic  . Diabetes Father     Past  Surgical History:  Procedure Laterality Date  . AUGMENTATION MAMMAPLASTY    . BREAST ENHANCEMENT SURGERY    . CESAREAN SECTION    . EXAM UNDER ANESTHESIA WITH MANIPULATION OF KNEE Right 06/06/2020   Procedure: EXAM UNDER ANESTHESIA WITH MANIPULATION OF KNEE;  Surgeon: Eldred Manges, MD;  Location: [facility];  Service: Orthopedics;  Laterality: Right;  . HARDWARE REMOVAL Right 06/06/2020   Procedure: right knee wire removal;  Surgeon: Eldred Manges, MD;  Location: [facility];  Service: Orthopedics;  Laterality: Right;  . HYSTEROSCOPY    . ORIF PATELLA Right 04/20/2020   Procedure: OPEN REDUCTION INTERNAL (ORIF) FIXATION RIGHT PATELLA;  Surgeon: Eldred Manges, MD;  [facility];  Service: Orthopedics;  Laterality: Right;   Social History   Occupational History  . Not on file  Tobacco Use  . Smoking status: Never Smoker  . Smokeless tobacco: Never Used  Vaping Use  . Vaping Use: Never used  Substance and Sexual Activity  . Alcohol use: No    Alcohol/week: 0.0 standard drinks  . Drug use: No  . Sexual activity: Yes  Partners: Male    Birth control/protection: Post-menopausal    Comment: husband vasectomy

## 2020-08-02 ENCOUNTER — Other Ambulatory Visit: Payer: Self-pay

## 2020-08-02 ENCOUNTER — Encounter: Payer: Self-pay | Admitting: Physical Therapy

## 2020-08-02 ENCOUNTER — Ambulatory Visit: Payer: Medicare HMO | Admitting: Physical Therapy

## 2020-08-02 DIAGNOSIS — M25661 Stiffness of right knee, not elsewhere classified: Secondary | ICD-10-CM

## 2020-08-02 DIAGNOSIS — R262 Difficulty in walking, not elsewhere classified: Secondary | ICD-10-CM | POA: Diagnosis not present

## 2020-08-02 DIAGNOSIS — M6281 Muscle weakness (generalized): Secondary | ICD-10-CM

## 2020-08-02 DIAGNOSIS — M25561 Pain in right knee: Secondary | ICD-10-CM | POA: Diagnosis not present

## 2020-08-02 DIAGNOSIS — R600 Localized edema: Secondary | ICD-10-CM

## 2020-08-02 NOTE — Therapy (Signed)
[facility] Physical Therapy [address] [place], [place], [address] Phone: [phone]   Fax:  [phone]  Physical Therapy Treatment  Patient Details  Name: Cheryl Glass MRN: [id number] Date of Birth: 11-22-1950 Referring Provider (PT): Dr. Ophelia Charter   Encounter Date: 08/02/2020   PT End of Session - 08/02/20 1008    Visit Number 11    Number of Visits 12    Date for PT Re-Evaluation 08/30/20    Authorization Type [employer]    Authorization Time Period 06/21/2020-08/30/2020    Authorization - Visit Number 11    Authorization - Number of Visits 12    Progress Note Due on Visit 20    PT Start Time 0930    PT Stop Time 1020    PT Time Calculation (min) 50 min    Activity Tolerance Patient tolerated treatment well    Behavior During Therapy [facility] for tasks assessed/performed           Past Medical History:  Diagnosis Date   Arthritis    Eating disorder    Hyperlipidemia    Hypertension    Osteoporosis    Right patella fracture    Wrist fracture 2013   right    Past Surgical History:  Procedure Laterality Date   AUGMENTATION MAMMAPLASTY     BREAST ENHANCEMENT SURGERY     CESAREAN SECTION     EXAM UNDER ANESTHESIA WITH MANIPULATION OF KNEE Right 06/06/2020   Procedure: EXAM UNDER ANESTHESIA WITH MANIPULATION OF KNEE;  Surgeon: Eldred Manges, MD;  Location: [facility];  Service: Orthopedics;  Laterality: Right;   HARDWARE REMOVAL Right 06/06/2020   Procedure: right knee wire removal;  Surgeon: Eldred Manges, MD;  Location: [facility];  Service: Orthopedics;  Laterality: Right;   HYSTEROSCOPY     ORIF PATELLA Right 04/20/2020   Procedure: OPEN REDUCTION INTERNAL (ORIF) FIXATION RIGHT PATELLA;  Surgeon: Eldred Manges, MD;  [facility];  Service: Orthopedics;  Laterality: Right;    There were no vitals filed for this visit.   Subjective Assessment - 08/02/20 0933    Subjective MD is pleased with progress  at this time.    Pertinent History ORIF Rt patella 04/20/2020, wire removal 06/06/2020    Patient Stated Goals Get back to walking, hiking, normal exercise    Currently in Pain? No/denies                             [facility] Adult PT Treatment/Exercise - 08/02/20 0001      Knee/Hip Exercises: Stretches   Lobbyist Right;3 reps;30 seconds    Quad Stretch Limitations prone with strap    Knee: Self-Stretch Limitations Rt knee; with LLE overpressure 10 x 10 sec      Knee/Hip Exercises: Aerobic   Recumbent Bike partial revolutions x 8 min      Knee/Hip Exercises: Machines for Strengthening   Total Gym Leg Press Rt SL 3 x 15 37 lbs      Knee/Hip Exercises: Prone   Hip Extension Strengthening;Right;1 set;20 reps      Vasopneumatic   Number Minutes Vasopneumatic  10 minutes    Vasopnuematic Location  Knee    Vasopneumatic Pressure Medium    Vasopneumatic Temperature  34      Manual Therapy   Manual therapy comments seated Rt knee flexion to tolerance  [phone]  Name: Cheryl Glass MRN: [id number] Date of Birth: 1951/09/11  [facility] Physical Therapy [address] [place], [place], [address] Phone: [phone]   Fax:  [phone]  Physical Therapy Treatment  Patient Details  Name: Cheryl Glass MRN: [id number] Date of Birth: 11-22-1950 Referring Provider (PT): Dr. Ophelia Charter   Encounter Date: 08/02/2020   PT End of Session - 08/02/20 1008    Visit Number 11    Number of Visits 12    Date for PT Re-Evaluation 08/30/20    Authorization Type [employer]    Authorization Time Period 06/21/2020-08/30/2020    Authorization - Visit Number 11    Authorization - Number of Visits 12    Progress Note Due on Visit 20    PT Start Time 0930    PT Stop Time 1020    PT Time Calculation (min) 50 min    Activity Tolerance Patient tolerated treatment well    Behavior During Therapy [facility] for tasks assessed/performed           Past Medical History:  Diagnosis Date   Arthritis    Eating disorder    Hyperlipidemia    Hypertension    Osteoporosis    Right patella fracture    Wrist fracture 2013   right    Past Surgical History:  Procedure Laterality Date   AUGMENTATION MAMMAPLASTY     BREAST ENHANCEMENT SURGERY     CESAREAN SECTION     EXAM UNDER ANESTHESIA WITH MANIPULATION OF KNEE Right 06/06/2020   Procedure: EXAM UNDER ANESTHESIA WITH MANIPULATION OF KNEE;  Surgeon: Eldred Manges, MD;  Location: [facility];  Service: Orthopedics;  Laterality: Right;   HARDWARE REMOVAL Right 06/06/2020   Procedure: right knee wire removal;  Surgeon: Eldred Manges, MD;  Location: [facility];  Service: Orthopedics;  Laterality: Right;   HYSTEROSCOPY     ORIF PATELLA Right 04/20/2020   Procedure: OPEN REDUCTION INTERNAL (ORIF) FIXATION RIGHT PATELLA;  Surgeon: Eldred Manges, MD;  [facility];  Service: Orthopedics;  Laterality: Right;    There were no vitals filed for this visit.   Subjective Assessment - 08/02/20 0933    Subjective MD is pleased with progress  at this time.    Pertinent History ORIF Rt patella 04/20/2020, wire removal 06/06/2020    Patient Stated Goals Get back to walking, hiking, normal exercise    Currently in Pain? No/denies                             [facility] Adult PT Treatment/Exercise - 08/02/20 0001      Knee/Hip Exercises: Stretches   Lobbyist Right;3 reps;30 seconds    Quad Stretch Limitations prone with strap    Knee: Self-Stretch Limitations Rt knee; with LLE overpressure 10 x 10 sec      Knee/Hip Exercises: Aerobic   Recumbent Bike partial revolutions x 8 min      Knee/Hip Exercises: Machines for Strengthening   Total Gym Leg Press Rt SL 3 x 15 37 lbs      Knee/Hip Exercises: Prone   Hip Extension Strengthening;Right;1 set;20 reps      Vasopneumatic   Number Minutes Vasopneumatic  10 minutes    Vasopnuematic Location  Knee    Vasopneumatic Pressure Medium    Vasopneumatic Temperature  34      Manual Therapy   Manual therapy comments seated Rt knee flexion to tolerance

## 2020-08-03 DIAGNOSIS — M810 Age-related osteoporosis without current pathological fracture: Secondary | ICD-10-CM | POA: Diagnosis not present

## 2020-08-03 DIAGNOSIS — Z1211 Encounter for screening for malignant neoplasm of colon: Secondary | ICD-10-CM | POA: Diagnosis not present

## 2020-08-03 DIAGNOSIS — M25542 Pain in joints of left hand: Secondary | ICD-10-CM | POA: Diagnosis not present

## 2020-08-03 DIAGNOSIS — E7800 Pure hypercholesterolemia, unspecified: Secondary | ICD-10-CM | POA: Diagnosis not present

## 2020-08-03 DIAGNOSIS — I10 Essential (primary) hypertension: Secondary | ICD-10-CM | POA: Diagnosis not present

## 2020-08-07 ENCOUNTER — Ambulatory Visit: Payer: Medicare HMO | Admitting: Rehabilitative and Restorative Service Providers"

## 2020-08-07 ENCOUNTER — Other Ambulatory Visit: Payer: Self-pay

## 2020-08-07 ENCOUNTER — Encounter: Payer: Self-pay | Admitting: Rehabilitative and Restorative Service Providers"

## 2020-08-07 DIAGNOSIS — M25661 Stiffness of right knee, not elsewhere classified: Secondary | ICD-10-CM | POA: Diagnosis not present

## 2020-08-07 DIAGNOSIS — M6281 Muscle weakness (generalized): Secondary | ICD-10-CM | POA: Diagnosis not present

## 2020-08-07 DIAGNOSIS — R600 Localized edema: Secondary | ICD-10-CM

## 2020-08-07 DIAGNOSIS — R262 Difficulty in walking, not elsewhere classified: Secondary | ICD-10-CM

## 2020-08-07 DIAGNOSIS — M25561 Pain in right knee: Secondary | ICD-10-CM

## 2020-08-07 NOTE — Therapy (Signed)
[facility] Physical Therapy [address] [place], [place], [address] Phone: [phone]   Fax:  [phone]  Physical Therapy Treatment/Progress Note/Recertification  Patient Details  Name: Cheryl Glass MRN: [id number] Date of Birth: [birth year] Referring Provider (PT): Dr. Ophelia Charter   Encounter Date: 08/07/2020   Progress Note Reporting Period 06/21/2020 to 08/07/2020  See note below for Objective Data and Assessment of Progress/Goals.        PT End of Session - 08/07/20 0929    Visit Number 12    Number of Visits 24    Date for PT Re-Evaluation 10/16/20    Authorization Type [employer]    Authorization Time Period 06/21/2020-08/30/2020 initial.  asking for 08/08/2020 - 10/16/2020    Authorization - Visit Number 12    Authorization - Number of Visits 12    Progress Note Due on Visit 22    PT Start Time 0927    PT Stop Time 1007    PT Time Calculation (min) 40 min    Activity Tolerance Patient tolerated treatment well    Behavior During Therapy WFL for tasks assessed/performed           Past Medical History:  Diagnosis Date   Arthritis    Eating disorder    Hyperlipidemia    Hypertension    Osteoporosis    Right patella fracture    Wrist fracture 2013   right    Past Surgical History:  Procedure Laterality Date   AUGMENTATION MAMMAPLASTY     BREAST ENHANCEMENT SURGERY     CESAREAN SECTION     EXAM UNDER ANESTHESIA WITH MANIPULATION OF KNEE Right 06/06/2020   Procedure: EXAM UNDER ANESTHESIA WITH MANIPULATION OF KNEE;  Surgeon: Eldred Manges, MD;  Location: [facility];  Service: Orthopedics;  Laterality: Right;   HARDWARE REMOVAL Right 06/06/2020   Procedure: right knee wire removal;  Surgeon: Eldred Manges, MD;  Location: [facility];  Service: Orthopedics;  Laterality: Right;   HYSTEROSCOPY     ORIF PATELLA Right 04/20/2020   Procedure: OPEN REDUCTION INTERNAL (ORIF) FIXATION RIGHT PATELLA;  Surgeon:  Eldred Manges, MD;  [facility];  Service: Orthopedics;  Laterality: Right;    There were no vitals filed for this visit.   Subjective Assessment - 08/07/20 0933    Subjective Pt. indicated feeling 75% overall improvement since start of treatment.  Pt. stated feeling less stiffness in morning that previous.  Walking primarily c SPC.    Pertinent History ORIF Rt patella 04/20/2020, wire removal 06/06/2020    Patient Stated Goals Get back to walking, hiking, normal exercise    Currently in Pain? No/denies    Pain Score 0-No pain    Pain Descriptors / Indicators Tightness    Aggravating Factors  stiffness insidious    Effect of Pain on Daily Activities limited in stairs, independent ambulation, hiking, squats              [facility] PT Assessment - 08/07/20 0001      Assessment   Medical Diagnosis Rt ORIF patella, wire removal     Referring Provider (PT) Dr. Ophelia Charter    Onset Date/Surgical Date 04/20/20      Home Environment   Living Environment Private residence      Prior Function   Level of Independence Independent      Cognition   Overall Cognitive Status Within Functional Limits for tasks assessed      AROM   Overall AROM Comments -1  or = 10 deg to facilitate usual ambulation s restriction    Time 10    Period Weeks    Status New    Target Date 10/16/20                  Plan - 08/07/20 0932    Clinical Impression Statement Pt. has attended 12 visits overall during course of treatment.  Pt. stated overall improvement at 75% at this time. See objective data for updated information.  Pt. demonstrated necessity for continued skilled PT services to address remaining Rt knee/ankle mobility deficits, strength deficits to promote full return to PLOF, independent ambulation s restriction.  Additional [employer] approval required    Personal Factors and Comorbidities Comorbidity 3+    Comorbidities osteoporosis, HTN, hyperlipidemia    Examination-Activity Limitations Sit;Sleep;Squat;Stairs;Stand;Locomotion Level;Lift;Transfers    Stability/Clinical Decision Making Evolving/Moderate complexity    Rehab Potential Good    PT Duration --   10   PT Treatment/Interventions ADLs/Self Care Home Management;Electrical Stimulation;Iontophoresis 4mg /ml Dexamethasone;Moist Heat;Neuromuscular re-education;Ultrasound;Stair training;Functional mobility training;Gait training;Patient/family education;Therapeutic activities;Therapeutic exercise;Balance training;Manual techniques;Vasopneumatic Device;Taping;Passive range of motion;Dry needling;Spinal Manipulations;Joint Manipulations    PT Next Visit Plan Continue to improve Rt ankle DF, knee mobiity, strength/balance.    PT Home Exercise Plan [id number]    Consulted and Agree with Plan of Care Patient           Patient will benefit from skilled therapeutic intervention in order to improve the following deficits and impairments:  Abnormal gait, Hypomobility, Decreased activity tolerance, Decreased strength, Pain, Difficulty walking, Decreased mobility, Decreased balance, Decreased range of motion, Impaired perceived functional ability, Impaired flexibility, Decreased coordination  Visit Diagnosis: Acute pain of right knee  Muscle weakness (generalized)  Stiffness of right knee, not elsewhere classified  Difficulty in  walking, not elsewhere classified  Localized edema     Problem List Patient Active Problem List   Diagnosis Date Noted   Displaced transverse fracture of left patella, subsequent encounter for closed fracture with delayed healing    Right patella fracture 04/20/2020    04/22/2020, PT, DPT, OCS, ATC 08/07/20  10:05 AM    [facility] [address] [place], [place], [place] Phone: [phone]   Fax:  [phone]  Name: Cheryl Glass MRN: [name] Date of Birth: [birth year]  or = 10 deg to facilitate usual ambulation s restriction    Time 10    Period Weeks    Status New    Target Date 10/16/20                  Plan - 08/07/20 0932    Clinical Impression Statement Pt. has attended 12 visits overall during course of treatment.  Pt. stated overall improvement at 75% at this time. See objective data for updated information.  Pt. demonstrated necessity for continued skilled PT services to address remaining Rt knee/ankle mobility deficits, strength deficits to promote full return to PLOF, independent ambulation s restriction.  Additional [employer] approval required    Personal Factors and Comorbidities Comorbidity 3+    Comorbidities osteoporosis, HTN, hyperlipidemia    Examination-Activity Limitations Sit;Sleep;Squat;Stairs;Stand;Locomotion Level;Lift;Transfers    Stability/Clinical Decision Making Evolving/Moderate complexity    Rehab Potential Good    PT Duration --   10   PT Treatment/Interventions ADLs/Self Care Home Management;Electrical Stimulation;Iontophoresis 4mg /ml Dexamethasone;Moist Heat;Neuromuscular re-education;Ultrasound;Stair training;Functional mobility training;Gait training;Patient/family education;Therapeutic activities;Therapeutic exercise;Balance training;Manual techniques;Vasopneumatic Device;Taping;Passive range of motion;Dry needling;Spinal Manipulations;Joint Manipulations    PT Next Visit Plan Continue to improve Rt ankle DF, knee mobiity, strength/balance.    PT Home Exercise Plan [id number]    Consulted and Agree with Plan of Care Patient           Patient will benefit from skilled therapeutic intervention in order to improve the following deficits and impairments:  Abnormal gait, Hypomobility, Decreased activity tolerance, Decreased strength, Pain, Difficulty walking, Decreased mobility, Decreased balance, Decreased range of motion, Impaired perceived functional ability, Impaired flexibility, Decreased coordination  Visit Diagnosis: Acute pain of right knee  Muscle weakness (generalized)  Stiffness of right knee, not elsewhere classified  Difficulty in  walking, not elsewhere classified  Localized edema     Problem List Patient Active Problem List   Diagnosis Date Noted   Displaced transverse fracture of left patella, subsequent encounter for closed fracture with delayed healing    Right patella fracture 04/20/2020    04/22/2020, PT, DPT, OCS, ATC 08/07/20  10:05 AM    [facility] [address] [place], [place], [place] Phone: [phone]   Fax:  [phone]  Name: Cheryl Glass MRN: [name] Date of Birth: [birth year]

## 2020-08-09 ENCOUNTER — Encounter: Payer: Self-pay | Admitting: Rehabilitative and Restorative Service Providers"

## 2020-08-09 ENCOUNTER — Ambulatory Visit: Payer: Medicare HMO | Admitting: Rehabilitative and Restorative Service Providers"

## 2020-08-09 ENCOUNTER — Other Ambulatory Visit: Payer: Self-pay

## 2020-08-09 DIAGNOSIS — R600 Localized edema: Secondary | ICD-10-CM

## 2020-08-09 DIAGNOSIS — M6281 Muscle weakness (generalized): Secondary | ICD-10-CM

## 2020-08-09 DIAGNOSIS — M25661 Stiffness of right knee, not elsewhere classified: Secondary | ICD-10-CM

## 2020-08-09 DIAGNOSIS — R262 Difficulty in walking, not elsewhere classified: Secondary | ICD-10-CM

## 2020-08-09 DIAGNOSIS — M25561 Pain in right knee: Secondary | ICD-10-CM | POA: Diagnosis not present

## 2020-08-09 NOTE — Therapy (Signed)
of right knee  Muscle weakness (generalized)  Stiffness of right knee, not elsewhere classified  Difficulty in walking, not elsewhere classified  Localized edema     Problem List Patient Active Problem List   Diagnosis Date Noted   Displaced transverse fracture of left patella, subsequent encounter for closed fracture with delayed healing    Right patella fracture 04/20/2020   Chyrel Masson, PT, DPT, OCS, ATC 08/09/20  10:02 AM     [facility] [address] [place], [place], [address] Phone: [phone]   Fax:  [phone]  Name: Cheryl Glass MRN: [id number] Date of Birth: 04/30/1951  [facility] Physical Therapy [address] [place], [place], [address] Phone: [phone]   Fax:  [phone]  Physical Therapy Treatment  Patient Details  Name: Cheryl Glass MRN: [id number] Date of Birth: 03-28-1951 Referring Provider (PT): Dr. Ophelia Charter   Encounter Date: 08/09/2020   PT End of Session - 08/09/20 0932    Visit Number 13    Number of Visits 24    Date for PT Re-Evaluation 10/16/20    Authorization Type [employer]    Authorization Time Period 06/21/2020-08/30/2020 initial.  asking for 08/08/2020 - 10/16/2020    Authorization - Visit Number 1    Authorization - Number of Visits 12    Progress Note Due on Visit 22    PT Start Time 0927    PT Stop Time 1006    PT Time Calculation (min) 39 min    Activity Tolerance Patient tolerated treatment well    Behavior During Therapy [facility] for tasks assessed/performed           Past Medical History:  Diagnosis Date   Arthritis    Eating disorder    Hyperlipidemia    Hypertension    Osteoporosis    Right patella fracture    Wrist fracture 2013   right    Past Surgical History:  Procedure Laterality Date   AUGMENTATION MAMMAPLASTY     BREAST ENHANCEMENT SURGERY     CESAREAN SECTION     EXAM UNDER ANESTHESIA WITH MANIPULATION OF KNEE Right 06/06/2020   Procedure: EXAM UNDER ANESTHESIA WITH MANIPULATION OF KNEE;  Surgeon: Eldred Manges, MD;  Location: [facility];  Service: Orthopedics;  Laterality: Right;   HARDWARE REMOVAL Right 06/06/2020   Procedure: right knee wire removal;  Surgeon: Eldred Manges, MD;  Location: [facility];  Service: Orthopedics;  Laterality: Right;   HYSTEROSCOPY     ORIF PATELLA Right 04/20/2020   Procedure: OPEN REDUCTION INTERNAL (ORIF) FIXATION RIGHT PATELLA;  Surgeon: Eldred Manges, MD;  [facility];  Service: Orthopedics;  Laterality: Right;    There were no vitals filed for this visit.   Subjective Assessment - 08/09/20 0930     Subjective Pt. stated no specific complaints today.    Pertinent History ORIF Rt patella 04/20/2020, wire removal 06/06/2020    Patient Stated Goals Get back to walking, hiking, normal exercise    Currently in Pain? No/denies    Pain Score 0-No pain    Pain Location Knee    Pain Orientation Right                             [facility] Adult PT Treatment/Exercise - 08/09/20 0001      Neuro Re-ed    Neuro Re-ed Details  star excursion 5 cones x 6 bilateral c slider, lateral stepping 3 cones x 10 bilateral, tandem ambulation fwd/back 15 ft x 6 each way      Knee/Hip Exercises: Stretches   Gastroc Stretch 30 seconds;3 reps;Right   runner stretch on incline board     Knee/Hip Exercises: Aerobic   Recumbent Bike partial revolutions 10 mins      Knee/Hip Exercises: Machines for Strengthening   Cybex Knee Extension eccentric LAQ 5 lbs 3 x 10    Total Gym Leg Press Rt SL 3 x 15 37 lbs                    PT Short Term Goals -  of right knee  Muscle weakness (generalized)  Stiffness of right knee, not elsewhere classified  Difficulty in walking, not elsewhere classified  Localized edema     Problem List Patient Active Problem List   Diagnosis Date Noted   Displaced transverse fracture of left patella, subsequent encounter for closed fracture with delayed healing    Right patella fracture 04/20/2020   Chyrel Masson, PT, DPT, OCS, ATC 08/09/20  10:02 AM     [facility] [address] [place], [place], [address] Phone: [phone]   Fax:  [phone]  Name: Cheryl Glass MRN: [id number] Date of Birth: 04/30/1951

## 2020-08-13 ENCOUNTER — Other Ambulatory Visit: Payer: Self-pay

## 2020-08-13 ENCOUNTER — Encounter: Payer: Self-pay | Admitting: Rehabilitative and Restorative Service Providers"

## 2020-08-13 ENCOUNTER — Ambulatory Visit: Payer: Medicare HMO | Admitting: Rehabilitative and Restorative Service Providers"

## 2020-08-13 DIAGNOSIS — R600 Localized edema: Secondary | ICD-10-CM | POA: Diagnosis not present

## 2020-08-13 DIAGNOSIS — M6281 Muscle weakness (generalized): Secondary | ICD-10-CM

## 2020-08-13 DIAGNOSIS — M25561 Pain in right knee: Secondary | ICD-10-CM

## 2020-08-13 DIAGNOSIS — M25661 Stiffness of right knee, not elsewhere classified: Secondary | ICD-10-CM | POA: Diagnosis not present

## 2020-08-13 DIAGNOSIS — R262 Difficulty in walking, not elsewhere classified: Secondary | ICD-10-CM

## 2020-08-13 NOTE — Therapy (Signed)
classified  Localized edema     Problem List Patient Active Problem List   Diagnosis Date Noted  . Displaced transverse fracture of left patella, subsequent encounter for closed fracture with delayed healing   . Right patella fracture 04/20/2020    Chyrel Masson, PT, DPT, OCS, ATC 08/13/20  11:38 AM    [facility] Physical Therapy [address] [place], [place], [address] Phone: [phone]   Fax:  [phone]  Name: Cheryl Glass MRN: [id number] Date of Birth: [birth year]  [facility] Physical Therapy [address] [place], [place], [address] Phone: [phone]   Fax:  [phone]  Physical Therapy Treatment  Patient Details  Name: Cheryl Glass MRN: [id number] Date of Birth: [birth year] Referring Provider (PT): Dr. Ophelia Charter   Encounter Date: 08/13/2020   PT End of Session - 08/13/20 1111    Visit Number 14    Number of Visits 24    Date for PT Re-Evaluation 10/16/20    Authorization Type [employer]    Authorization Time Period 06/21/2020-08/30/2020 initial.  asking for 08/08/2020 - 10/16/2020    Authorization - Visit Number 2    Authorization - Number of Visits 12    Progress Note Due on Visit 22    PT Start Time 1059    PT Stop Time 1140    PT Time Calculation (min) 41 min    Activity Tolerance Patient tolerated treatment well    Behavior During Therapy [facility] for tasks assessed/performed           Past Medical History:  Diagnosis Date  . Arthritis   . Eating disorder   . Hyperlipidemia   . Hypertension   . Osteoporosis   . Right patella fracture   . Wrist fracture 2013   right    Past Surgical History:  Procedure Laterality Date  . AUGMENTATION MAMMAPLASTY    . BREAST ENHANCEMENT SURGERY    . CESAREAN SECTION    . EXAM UNDER ANESTHESIA WITH MANIPULATION OF KNEE Right 06/06/2020   Procedure: EXAM UNDER ANESTHESIA WITH MANIPULATION OF KNEE;  Surgeon: Eldred Manges, MD;  Location: [facility];  Service: Orthopedics;  Laterality: Right;  . HARDWARE REMOVAL Right 06/06/2020   Procedure: right knee wire removal;  Surgeon: Eldred Manges, MD;  Location: [facility];  Service: Orthopedics;  Laterality: Right;  . HYSTEROSCOPY    . ORIF PATELLA Right 04/20/2020   Procedure: OPEN REDUCTION INTERNAL (ORIF) FIXATION RIGHT PATELLA;  Surgeon: Eldred Manges, MD;  Location: [facility];  Service: Orthopedics;  Laterality: Right;    There were no vitals filed for this visit.   Subjective Assessment - 08/13/20 1111     Subjective Pt. indicated no pain today or overweekend.  Pt. stated doing fairly well.    Pertinent History ORIF Rt patella 04/20/2020, wire removal 06/06/2020    Patient Stated Goals Get back to walking, hiking, normal exercise    Currently in Pain? No/denies    Pain Score 0-No pain                             [facility] Adult PT Treatment/Exercise - 08/13/20 0001      Neuro Re-ed    Neuro Re-ed Details  ankle rocker board fwd/back 30 x each way (2 HHA), lateral stepping 3 cones x 10 bilateral      Knee/Hip Exercises: Aerobic   Nustep Lvl 6 10 mins      Knee/Hip Exercises: Machines for Strengthening   Total Gym Leg Press Rt SL 43 lbs x 10, x 10 , x10      Knee/Hip Exercises: Standing   Lateral Step Up Step Height: 6";Hand Hold: 2;Both;2 sets;10 reps   eccentric lowering focus                   PT Short Term Goals - 07/06/20 1212      PT SHORT TERM GOAL #1   Title Patient will demonstrate independent use  [facility] Physical Therapy [address] [place], [place], [address] Phone: [phone]   Fax:  [phone]  Physical Therapy Treatment  Patient Details  Name: Cheryl Glass MRN: [id number] Date of Birth: [birth year] Referring Provider (PT): Dr. Ophelia Charter   Encounter Date: 08/13/2020   PT End of Session - 08/13/20 1111    Visit Number 14    Number of Visits 24    Date for PT Re-Evaluation 10/16/20    Authorization Type [employer]    Authorization Time Period 06/21/2020-08/30/2020 initial.  asking for 08/08/2020 - 10/16/2020    Authorization - Visit Number 2    Authorization - Number of Visits 12    Progress Note Due on Visit 22    PT Start Time 1059    PT Stop Time 1140    PT Time Calculation (min) 41 min    Activity Tolerance Patient tolerated treatment well    Behavior During Therapy [facility] for tasks assessed/performed           Past Medical History:  Diagnosis Date  . Arthritis   . Eating disorder   . Hyperlipidemia   . Hypertension   . Osteoporosis   . Right patella fracture   . Wrist fracture 2013   right    Past Surgical History:  Procedure Laterality Date  . AUGMENTATION MAMMAPLASTY    . BREAST ENHANCEMENT SURGERY    . CESAREAN SECTION    . EXAM UNDER ANESTHESIA WITH MANIPULATION OF KNEE Right 06/06/2020   Procedure: EXAM UNDER ANESTHESIA WITH MANIPULATION OF KNEE;  Surgeon: Eldred Manges, MD;  Location: [facility];  Service: Orthopedics;  Laterality: Right;  . HARDWARE REMOVAL Right 06/06/2020   Procedure: right knee wire removal;  Surgeon: Eldred Manges, MD;  Location: [facility];  Service: Orthopedics;  Laterality: Right;  . HYSTEROSCOPY    . ORIF PATELLA Right 04/20/2020   Procedure: OPEN REDUCTION INTERNAL (ORIF) FIXATION RIGHT PATELLA;  Surgeon: Eldred Manges, MD;  [facility];  Service: Orthopedics;  Laterality: Right;    There were no vitals filed for this visit.   Subjective Assessment - 08/13/20 1111     Subjective Pt. indicated no pain today or overweekend.  Pt. stated doing fairly well.    Pertinent History ORIF Rt patella 04/20/2020, wire removal 06/06/2020    Patient Stated Goals Get back to walking, hiking, normal exercise    Currently in Pain? No/denies    Pain Score 0-No pain                             [facility] Adult PT Treatment/Exercise - 08/13/20 0001      Neuro Re-ed    Neuro Re-ed Details  ankle rocker board fwd/back 30 x each way (2 HHA), lateral stepping 3 cones x 10 bilateral      Knee/Hip Exercises: Aerobic   Nustep Lvl 6 10 mins      Knee/Hip Exercises: Machines for Strengthening   Total Gym Leg Press Rt SL 43 lbs x 10, x 10 , x10      Knee/Hip Exercises: Standing   Lateral Step Up Step Height: 6";Hand Hold: 2;Both;2 sets;10 reps   eccentric lowering focus                   PT Short Term Goals - 07/06/20 1212      PT SHORT TERM GOAL #1   Title Patient will demonstrate independent use

## 2020-08-15 ENCOUNTER — Other Ambulatory Visit: Payer: Self-pay

## 2020-08-15 ENCOUNTER — Encounter: Payer: Self-pay | Admitting: Rehabilitative and Restorative Service Providers"

## 2020-08-15 ENCOUNTER — Ambulatory Visit: Payer: Medicare HMO | Admitting: Rehabilitative and Restorative Service Providers"

## 2020-08-15 DIAGNOSIS — M25561 Pain in right knee: Secondary | ICD-10-CM

## 2020-08-15 DIAGNOSIS — M6281 Muscle weakness (generalized): Secondary | ICD-10-CM

## 2020-08-15 DIAGNOSIS — R262 Difficulty in walking, not elsewhere classified: Secondary | ICD-10-CM

## 2020-08-15 DIAGNOSIS — M25661 Stiffness of right knee, not elsewhere classified: Secondary | ICD-10-CM | POA: Diagnosis not present

## 2020-08-15 DIAGNOSIS — R600 Localized edema: Secondary | ICD-10-CM | POA: Diagnosis not present

## 2020-08-15 NOTE — Therapy (Signed)
[facility] Physical Therapy [address] [place], [place], [address] Phone: [phone]   Fax:  [phone]  Physical Therapy Treatment  Patient Details  Name: Ferne Ellingwood MRN: [id number] Date of Birth: [birth year] Referring Provider (PT): Dr. Ophelia Charter   Encounter Date: 08/15/2020   PT End of Session - 08/15/20 1051    Visit Number 15    Number of Visits 24    Date for PT Re-Evaluation 10/16/20    Authorization Type [employer]    Authorization Time Period 06/21/2020-08/30/2020 initial.  asking for 08/08/2020 - 10/16/2020    Authorization - Visit Number 3    Authorization - Number of Visits 12    Progress Note Due on Visit 22    PT Start Time 1056    PT Stop Time 1138    PT Time Calculation (min) 42 min    Activity Tolerance Patient tolerated treatment well    Behavior During Therapy [facility] for tasks assessed/performed           Past Medical History:  Diagnosis Date   Arthritis    Eating disorder    Hyperlipidemia    Hypertension    Osteoporosis    Right patella fracture    Wrist fracture 2013   right    Past Surgical History:  Procedure Laterality Date   AUGMENTATION MAMMAPLASTY     BREAST ENHANCEMENT SURGERY     CESAREAN SECTION     EXAM UNDER ANESTHESIA WITH MANIPULATION OF KNEE Right 06/06/2020   Procedure: EXAM UNDER ANESTHESIA WITH MANIPULATION OF KNEE;  Surgeon: Eldred Manges, MD;  Location: [facility];  Service: Orthopedics;  Laterality: Right;   HARDWARE REMOVAL Right 06/06/2020   Procedure: right knee wire removal;  Surgeon: Eldred Manges, MD;  Location: [facility];  Service: Orthopedics;  Laterality: Right;   HYSTEROSCOPY     ORIF PATELLA Right 04/20/2020   Procedure: OPEN REDUCTION INTERNAL (ORIF) FIXATION RIGHT PATELLA;  Surgeon: Eldred Manges, MD;  [facility];  Service: Orthopedics;  Laterality: Right;    There were no vitals filed for this visit.   Subjective Assessment - 08/15/20 1101      Subjective Pt. indicated no specific complaints overall.    Pertinent History ORIF Rt patella 04/20/2020, wire removal 06/06/2020    Patient Stated Goals Get back to walking, hiking, normal exercise    Currently in Pain? No/denies              [facility] PT Assessment - 08/15/20 0001      Assessment   Medical Diagnosis Rt ORIF patella, wire removal                          [facility] Adult PT Treatment/Exercise - 08/15/20 0001      Neuro Re-ed    Neuro Re-ed Details  SLS c 9 inch hurdle step over/back x 15 bilateral, tandem ambulation 15 ft x 5 each way   SBA/Min A at times     Knee/Hip Exercises: Stretches   Gastroc Stretch 5 reps;30 seconds   Rt LE incline     Knee/Hip Exercises: Aerobic   Recumbent Bike partial rev 5 mins, full revs back/fwd 5 mins      Knee/Hip Exercises: Machines for Strengthening   Cybex Knee Extension Eccentric 3 x 12 10 lbs Rt LE    Cybex Knee Flexion SL 10 lbs rt LE 3 x 12    Total Gym Leg Press 43  Consulted and Agree with Plan of Care Patient           Patient will benefit from skilled therapeutic intervention in order to improve the following deficits and impairments:  Abnormal gait, Hypomobility, Decreased activity tolerance, Decreased strength, Pain, Difficulty walking, Decreased mobility, Decreased balance, Decreased range of motion, Impaired perceived functional ability, Impaired flexibility, Decreased coordination  Visit Diagnosis: Acute pain of right knee  Muscle weakness (generalized)  Stiffness of right knee, not elsewhere classified  Difficulty in walking, not elsewhere classified  Localized edema     Problem List Patient Active Problem List   Diagnosis Date Noted   Displaced transverse fracture of left patella, subsequent encounter for closed fracture with delayed healing    Right patella fracture 04/20/2020    Chyrel Masson, PT, DPT, OCS, ATC 08/15/20  11:35 AM    [facility] Physical Therapy [address] [place], [place], [address] Phone: [phone]   Fax:  [phone]  Name: Hilary Milks MRN: [id number] Date of Birth: 02/03/1951  Consulted and Agree with Plan of Care Patient           Patient will benefit from skilled therapeutic intervention in order to improve the following deficits and impairments:  Abnormal gait, Hypomobility, Decreased activity tolerance, Decreased strength, Pain, Difficulty walking, Decreased mobility, Decreased balance, Decreased range of motion, Impaired perceived functional ability, Impaired flexibility, Decreased coordination  Visit Diagnosis: Acute pain of right knee  Muscle weakness (generalized)  Stiffness of right knee, not elsewhere classified  Difficulty in walking, not elsewhere classified  Localized edema     Problem List Patient Active Problem List   Diagnosis Date Noted   Displaced transverse fracture of left patella, subsequent encounter for closed fracture with delayed healing    Right patella fracture 04/20/2020    Chyrel Masson, PT, DPT, OCS, ATC 08/15/20  11:35 AM    [facility] Physical Therapy [address] [place], [place], [address] Phone: [phone]   Fax:  [phone]  Name: Hilary Milks MRN: [id number] Date of Birth: 02/03/1951

## 2020-08-20 ENCOUNTER — Other Ambulatory Visit: Payer: Self-pay

## 2020-08-20 ENCOUNTER — Ambulatory Visit: Payer: Medicare HMO | Admitting: Rehabilitative and Restorative Service Providers"

## 2020-08-20 DIAGNOSIS — M6281 Muscle weakness (generalized): Secondary | ICD-10-CM

## 2020-08-20 DIAGNOSIS — M25561 Pain in right knee: Secondary | ICD-10-CM

## 2020-08-20 DIAGNOSIS — M25661 Stiffness of right knee, not elsewhere classified: Secondary | ICD-10-CM | POA: Diagnosis not present

## 2020-08-20 DIAGNOSIS — R262 Difficulty in walking, not elsewhere classified: Secondary | ICD-10-CM

## 2020-08-20 DIAGNOSIS — R600 Localized edema: Secondary | ICD-10-CM | POA: Diagnosis not present

## 2020-08-20 NOTE — Therapy (Signed)
[facility] Physical Therapy [address] [place], [place], [address] Phone: [phone]   Fax:  [phone]  Physical Therapy Treatment  Patient Details  Name: Cheryl Glass MRN: [id number] Date of Birth: 11-30-1950 Referring Provider (PT): Dr. Ophelia Charter   Encounter Date: 08/20/2020   PT End of Session - 08/20/20 1054    Visit Number 16    Number of Visits 24    Date for PT Re-Evaluation 10/16/20    Authorization Type [employer]    Authorization Time Period 06/21/2020-08/30/2020 initial.  asking for 08/08/2020 - 10/16/2020    Authorization - Visit Number 4    Authorization - Number of Visits 12    Progress Note Due on Visit 22    PT Start Time 1052    PT Stop Time 1135    PT Time Calculation (min) 43 min    Activity Tolerance Patient tolerated treatment well    Behavior During Therapy [facility] for tasks assessed/performed           Past Medical History:  Diagnosis Date   Arthritis    Eating disorder    Hyperlipidemia    Hypertension    Osteoporosis    Right patella fracture    Wrist fracture 2013   right    Past Surgical History:  Procedure Laterality Date   AUGMENTATION MAMMAPLASTY     BREAST ENHANCEMENT SURGERY     CESAREAN SECTION     EXAM UNDER ANESTHESIA WITH MANIPULATION OF KNEE Right 06/06/2020   Procedure: EXAM UNDER ANESTHESIA WITH MANIPULATION OF KNEE;  Surgeon: Eldred Manges, MD;  Location: [facility];  Service: Orthopedics;  Laterality: Right;   HARDWARE REMOVAL Right 06/06/2020   Procedure: right knee wire removal;  Surgeon: Eldred Manges, MD;  Location: [facility];  Service: Orthopedics;  Laterality: Right;   HYSTEROSCOPY     ORIF PATELLA Right 04/20/2020   Procedure: OPEN REDUCTION INTERNAL (ORIF) FIXATION RIGHT PATELLA;  Surgeon: Eldred Manges, MD;  [facility];  Service: Orthopedics;  Laterality: Right;    There were no vitals filed for this visit.   Subjective Assessment - 08/20/20 1056      Subjective Pt. indicated she didn't want to drive so she changed appointment for Wednesday.  Pt. stated no pain.    Pertinent History ORIF Rt patella 04/20/2020, wire removal 06/06/2020    Patient Stated Goals Get back to walking, hiking, normal exercise    Currently in Pain? No/denies    Pain Score 0-No pain                             [facility] Adult PT Treatment/Exercise - 08/20/20 0001      Neuro Re-ed    Neuro Re-ed Details  tandem ambulation fwd/back 8 ft on foam x 6 each way, wobble board fwd/back 30 x,       Knee/Hip Exercises: Aerobic   Recumbent Bike full rev fwd/back 9 mins      Knee/Hip Exercises: Machines for Strengthening   Cybex Knee Extension eccentric 3 x10 lbs Rt LE    Cybex Knee Flexion SL 10 lbs rt LE 3 x 10    Total Gym Leg Press Rt LE 75 lbs 3 x 10                    PT Short Term Goals - 07/06/20 1212      PT SHORT TERM GOAL #1  knee, not elsewhere classified  Muscle weakness (generalized)  Difficulty in walking, not elsewhere classified  Localized edema     Problem List Patient Active Problem List   Diagnosis Date Noted   Displaced transverse fracture of left patella, subsequent encounter for closed fracture with delayed healing    Right patella fracture 04/20/2020    Chyrel Masson, PT, DPT, OCS, ATC 08/20/20  11:34 AM    [facility] Physical Therapy [address] [place], [place], [address] Phone: [phone]   Fax:  [phone]  Name: Cheryl Glass MRN: [id number] Date of Birth: [birth year]  knee, not elsewhere classified  Muscle weakness (generalized)  Difficulty in walking, not elsewhere classified  Localized edema     Problem List Patient Active Problem List   Diagnosis Date Noted   Displaced transverse fracture of left patella, subsequent encounter for closed fracture with delayed healing    Right patella fracture 04/20/2020    Chyrel Masson, PT, DPT, OCS, ATC 08/20/20  11:34 AM    [facility] Physical Therapy [address] [place], [place], [address] Phone: [phone]   Fax:  [phone]  Name: Cheryl Glass MRN: [id number] Date of Birth: [birth year]

## 2020-08-22 ENCOUNTER — Encounter: Payer: Self-pay | Admitting: Rehabilitative and Restorative Service Providers"

## 2020-08-22 ENCOUNTER — Ambulatory Visit: Payer: Medicare HMO | Admitting: Rehabilitative and Restorative Service Providers"

## 2020-08-22 ENCOUNTER — Encounter: Payer: Medicare HMO | Admitting: Rehabilitative and Restorative Service Providers"

## 2020-08-22 ENCOUNTER — Other Ambulatory Visit: Payer: Self-pay

## 2020-08-22 DIAGNOSIS — M6281 Muscle weakness (generalized): Secondary | ICD-10-CM | POA: Diagnosis not present

## 2020-08-22 DIAGNOSIS — R262 Difficulty in walking, not elsewhere classified: Secondary | ICD-10-CM

## 2020-08-22 DIAGNOSIS — M25561 Pain in right knee: Secondary | ICD-10-CM | POA: Diagnosis not present

## 2020-08-22 DIAGNOSIS — M25661 Stiffness of right knee, not elsewhere classified: Secondary | ICD-10-CM | POA: Diagnosis not present

## 2020-08-22 DIAGNOSIS — R600 Localized edema: Secondary | ICD-10-CM | POA: Diagnosis not present

## 2020-08-22 NOTE — Therapy (Signed)
[facility] Physical Therapy [address] [place], [place], [address] Phone: [phone]   Fax:  [phone]  Physical Therapy Treatment  Patient Details  Name: Cheryl Glass MRN: [id number] Date of Birth: [birth year] Referring Provider (PT): Dr. Ophelia Charter   Encounter Date: 08/22/2020   PT End of Session - 08/22/20 0802    Visit Number 17    Number of Visits 24    Date for PT Re-Evaluation 10/16/20    Authorization Type [employer]    Authorization Time Period 06/21/2020-08/30/2020 initial.  asking for 08/08/2020 - 10/16/2020    Authorization - Visit Number 5    Authorization - Number of Visits 12    Progress Note Due on Visit 22    PT Start Time 0757    PT Stop Time 0838    PT Time Calculation (min) 41 min    Activity Tolerance Patient tolerated treatment well    Behavior During Therapy [facility] for tasks assessed/performed           Past Medical History:  Diagnosis Date  . Arthritis   . Eating disorder   . Hyperlipidemia   . Hypertension   . Osteoporosis   . Right patella fracture   . Wrist fracture 2013   right    Past Surgical History:  Procedure Laterality Date  . AUGMENTATION MAMMAPLASTY    . BREAST ENHANCEMENT SURGERY    . CESAREAN SECTION    . EXAM UNDER ANESTHESIA WITH MANIPULATION OF KNEE Right 06/06/2020   Procedure: EXAM UNDER ANESTHESIA WITH MANIPULATION OF KNEE;  Surgeon: Eldred Manges, MD;  Location: [facility];  Service: Orthopedics;  Laterality: Right;  . HARDWARE REMOVAL Right 06/06/2020   Procedure: right knee wire removal;  Surgeon: Eldred Manges, MD;  Location: [facility];  Service: Orthopedics;  Laterality: Right;  . HYSTEROSCOPY    . ORIF PATELLA Right 04/20/2020   Procedure: OPEN REDUCTION INTERNAL (ORIF) FIXATION RIGHT PATELLA;  Surgeon: Eldred Manges, MD;  [facility];  Service: Orthopedics;  Laterality: Right;    There were no vitals filed for this visit.   Subjective Assessment - 08/22/20 0801      Subjective Pt. stated feeling soreness in knee since last visit, reporting nothing major but noted.  No pain today upon arrival.    Pertinent History ORIF Rt patella 04/20/2020, wire removal 06/06/2020    Patient Stated Goals Get back to walking, hiking, normal exercise    Currently in Pain? No/denies    Pain Score 0-No pain    Pain Location Knee    Pain Orientation Right    Pain Descriptors / Indicators Sore    Aggravating Factors  sore after last visit changes                             [facility] Adult PT Treatment/Exercise - 08/22/20 0001      Neuro Re-ed    Neuro Re-ed Details  star excursion slider 5 cones x 6 bilateral, SLS c contralateral LE touches 3 cones anterior/medial/lateral x 10 each LE, wobble board fwd/back 30 x       Knee/Hip Exercises: Stretches   Gastroc Stretch 3 reps;30 seconds;Right   runner stretch on incline board     Knee/Hip Exercises: Aerobic   Other Aerobic UBE LE only lvl 2 6 mins fwd/back revolutions      Knee/Hip Exercises: Machines for Strengthening   Cybex Knee Extension eccentric LAQ 3 x  10 Rt LE    Cybex Knee Flexion SL 10 lbs rt LE 3 x 10    Total Gym Leg Press Rt LE 62 lbs 3 x 15, DL 4U98 62 lbs      Knee/Hip Exercises: Standing   Other Standing Knee Exercises slow eccentric squat over 21 inch chair and stand 20x no UE                    PT Short Term Goals - 07/06/20 1212      PT SHORT TERM GOAL #1   Title Patient will demonstrate independent use of home exercise program to maintain progress from in clinic treatments.    Time 3    Period Weeks    Status Achieved    Target Date 07/12/20             PT Long Term Goals - 08/07/20 0959      PT LONG TERM GOAL #1   Title Patient will demonstrate/report pain at worst less than or equal to 2/10 to facilitate minimal limitation in daily activity secondary to pain symptoms.    Status Achieved      PT LONG TERM GOAL #2   Title Patient will demonstrate  independent use of home exercise program to facilitate ability to maintain/progress functional gains from skilled physical therapy services.    Status Achieved      PT LONG TERM GOAL #3   Title Pt. will demonstrate independent ambulation community distances s restriction.    Baseline Pt still amb with cane and reporting difficulties with uneven surfaces (for example parking lots on incline)    Time 10    Period Weeks    Status Revised    Target Date 10/16/20      PT LONG TERM GOAL #4   Title Pt. will demonstrate Rt LE MMT 5/5 throughout to facilitate squats, stairs, standing, walking at [facility] s limitation.    Time 10    Status Revised    Target Date 10/16/20      PT LONG TERM GOAL #5   Title Pt. will demonstrate Rt knee AROM 0-110 deg to facilitate usual transfers, squat, kneeling at PLOF s limitation.    Time 10    Status Revised    Target Date 10/16/20      Additional Long Term Goals   Additional Long Term Goals Yes      PT LONG TERM GOAL #6   Title Pt. will demonstrate Rt ankle DF > or = 10 deg to facilitate usual ambulation s restriction    Time 10    Period Weeks    Status New    Target Date 10/16/20                 Plan - 08/22/20 0837    Clinical Impression Statement Improvement noted in mobility for Rt knee, ankle but still showing some limitations compared to full ranges that can impair gait cycle and WB loading.  Adjusted resisitive exercise today based off soreness after changes last visit.  Static balance improving but dynamic balance fair.    Personal Factors and Comorbidities Comorbidity 3+    Comorbidities osteoporosis, HTN, hyperlipidemia    Examination-Activity Limitations Sit;Sleep;Squat;Stairs;Stand;Locomotion Level;Lift;Transfers    Stability/Clinical Decision Making Evolving/Moderate complexity    Rehab Potential Good    PT Duration --   10   PT Treatment/Interventions ADLs/Self Care Home Management;Electrical Stimulation;Iontophoresis 4mg /ml  Dexamethasone;Moist Heat;Neuromuscular re-education;Ultrasound;Stair training;Functional mobility training;Gait training;Patient/family education;Therapeutic activities;Therapeutic exercise;Balance  training;Manual techniques;Vasopneumatic Device;Taping;Passive range of motion;Dry needling;Spinal Manipulations;Joint Manipulations    PT Next Visit Plan Dynamic and compliant surface balance, functional WB loading for progressive mobility gains.    PT Home Exercise Plan [id number]    Consulted and Agree with Plan of Care Patient           Patient will benefit from skilled therapeutic intervention in order to improve the following deficits and impairments:  Abnormal gait, Hypomobility, Decreased activity tolerance, Decreased strength, Pain, Difficulty walking, Decreased mobility, Decreased balance, Decreased range of motion, Impaired perceived functional ability, Impaired flexibility, Decreased coordination  Visit Diagnosis: Acute pain of right knee  Stiffness of right knee, not elsewhere classified  Muscle weakness (generalized)  Difficulty in walking, not elsewhere classified  Localized edema     Problem List Patient Active Problem List   Diagnosis Date Noted  . Displaced transverse fracture of left patella, subsequent encounter for closed fracture with delayed healing   . Right patella fracture 04/20/2020   Chyrel Masson, PT, DPT, OCS, ATC 08/22/20  8:40 AM    [facility] Physical Therapy [address] [place], [place], [address] Phone: [phone]   Fax:  [phone]  Name: Lulabelle Dancer MRN: [id number] Date of Birth: 1951-01-01

## 2020-08-27 ENCOUNTER — Encounter: Payer: Self-pay | Admitting: Rehabilitative and Restorative Service Providers"

## 2020-08-27 ENCOUNTER — Ambulatory Visit: Payer: Medicare HMO | Admitting: Rehabilitative and Restorative Service Providers"

## 2020-08-27 ENCOUNTER — Other Ambulatory Visit: Payer: Self-pay

## 2020-08-27 DIAGNOSIS — R262 Difficulty in walking, not elsewhere classified: Secondary | ICD-10-CM | POA: Diagnosis not present

## 2020-08-27 DIAGNOSIS — R600 Localized edema: Secondary | ICD-10-CM | POA: Diagnosis not present

## 2020-08-27 DIAGNOSIS — M25661 Stiffness of right knee, not elsewhere classified: Secondary | ICD-10-CM

## 2020-08-27 DIAGNOSIS — M25561 Pain in right knee: Secondary | ICD-10-CM

## 2020-08-27 DIAGNOSIS — M6281 Muscle weakness (generalized): Secondary | ICD-10-CM

## 2020-08-27 NOTE — Therapy (Signed)
not elsewhere classified  Localized edema     Problem List Patient Active Problem List   Diagnosis Date Noted  . Displaced transverse fracture of left patella, subsequent encounter for closed fracture with delayed healing   . Right patella fracture 04/20/2020    Chyrel Masson, PT, DPT, OCS, ATC 08/27/20  10:55 AM    [facility] Physical Therapy [address] [place], [place], [address] Phone: [phone]   Fax:  [phone]  Name: Cheryl Glass MRN: [id number] Date of Birth: 1950-10-21  not elsewhere classified  Localized edema     Problem List Patient Active Problem List   Diagnosis Date Noted  . Displaced transverse fracture of left patella, subsequent encounter for closed fracture with delayed healing   . Right patella fracture 04/20/2020    Chyrel Masson, PT, DPT, OCS, ATC 08/27/20  10:55 AM    [facility] Physical Therapy [address] [place], [place], [address] Phone: [phone]   Fax:  [phone]  Name: Cheryl Glass MRN: [id number] Date of Birth: 1950-10-21  [facility] Physical Therapy [address] [place], [place], [address] Phone: [phone]   Fax:  [phone]  Physical Therapy Treatment  Patient Details  Name: Cheryl Glass MRN: [id number] Date of Birth: 1951/06/03 Referring Provider (PT): Dr. Ophelia Charter   Encounter Date: 08/27/2020   PT End of Session - 08/27/20 1024    Visit Number 18    Number of Visits 24    Date for PT Re-Evaluation 10/16/20    Authorization Type [employer]    Authorization Time Period 06/21/2020-08/30/2020 initial.  asking for 08/08/2020 - 10/16/2020    Authorization - Visit Number 6    Authorization - Number of Visits 12    Progress Note Due on Visit 22    PT Start Time 1015    PT Stop Time 1055    PT Time Calculation (min) 40 min    Activity Tolerance Patient tolerated treatment well    Behavior During Therapy [facility] for tasks assessed/performed           Past Medical History:  Diagnosis Date  . Arthritis   . Eating disorder   . Hyperlipidemia   . Hypertension   . Osteoporosis   . Right patella fracture   . Wrist fracture 2013   right    Past Surgical History:  Procedure Laterality Date  . AUGMENTATION MAMMAPLASTY    . BREAST ENHANCEMENT SURGERY    . CESAREAN SECTION    . EXAM UNDER ANESTHESIA WITH MANIPULATION OF KNEE Right 06/06/2020   Procedure: EXAM UNDER ANESTHESIA WITH MANIPULATION OF KNEE;  Surgeon: Eldred Manges, MD;  Location: [facility];  Service: Orthopedics;  Laterality: Right;  . HARDWARE REMOVAL Right 06/06/2020   Procedure: right knee wire removal;  Surgeon: Eldred Manges, MD;  Location: [facility];  Service: Orthopedics;  Laterality: Right;  . HYSTEROSCOPY    . ORIF PATELLA Right 04/20/2020   Procedure: OPEN REDUCTION INTERNAL (ORIF) FIXATION RIGHT PATELLA;  Surgeon: Eldred Manges, MD;  [facility];  Service: Orthopedics;  Laterality: Right;    There were no vitals filed for this visit.                       [facility] Adult PT Treatment/Exercise - 08/27/20 0001      Neuro Re-ed    Neuro Re-ed Details  shuttle drill fwd, lateral, back stepping 10 ft footprint x 5, SLS c anterior/medial/lateral cone touching x 5 each LE      Knee/Hip Exercises: Stretches   Gastroc Stretch 5 reps;30 seconds;Right   runner stretch on incline board     Knee/Hip Exercises: Aerobic   Recumbent Bike Full rev 10 mins lvl 3      Knee/Hip Exercises: Standing   Lateral Step Up Step Height: 6";3 sets;10 reps   eccentric focus     Knee/Hip Exercises: Seated   Other Seated Knee/Hip Exercises tailgate stretch 3 mins    Sit to Sand 20 reps;without UE support   Rt LE posterior, eccentric focus 18 inch table     Manual Therapy   Manual therapy comments percussive device to Rt quad, Rt calf for mobility gains                    PT Short Term Goals - 07/06/20 1212      PT SHORT TERM GOAL #1   Title Patient will demonstrate independent use of home exercise program to maintain progress from in clinic treatments.

## 2020-08-29 ENCOUNTER — Ambulatory Visit: Payer: Medicare HMO | Admitting: Rehabilitative and Restorative Service Providers"

## 2020-08-29 ENCOUNTER — Other Ambulatory Visit: Payer: Self-pay

## 2020-08-29 ENCOUNTER — Encounter: Payer: Self-pay | Admitting: Rehabilitative and Restorative Service Providers"

## 2020-08-29 DIAGNOSIS — R600 Localized edema: Secondary | ICD-10-CM | POA: Diagnosis not present

## 2020-08-29 DIAGNOSIS — M25661 Stiffness of right knee, not elsewhere classified: Secondary | ICD-10-CM | POA: Diagnosis not present

## 2020-08-29 DIAGNOSIS — M25561 Pain in right knee: Secondary | ICD-10-CM | POA: Diagnosis not present

## 2020-08-29 DIAGNOSIS — M6281 Muscle weakness (generalized): Secondary | ICD-10-CM

## 2020-08-29 DIAGNOSIS — R262 Difficulty in walking, not elsewhere classified: Secondary | ICD-10-CM | POA: Diagnosis not present

## 2020-08-29 NOTE — Therapy (Signed)
Period Weeks    Status Achieved    Target Date 07/12/20             PT Long Term Goals - 08/07/20 0959      PT LONG TERM GOAL #1   Title Patient will demonstrate/report pain at worst less than or equal to 2/10 to facilitate minimal limitation in daily activity secondary to pain symptoms.    Status Achieved      PT LONG TERM GOAL #2   Title Patient will demonstrate independent use of home exercise program to facilitate ability to maintain/progress functional gains from skilled physical therapy services.    Status Achieved      PT LONG TERM GOAL #3   Title Pt. will demonstrate independent ambulation community distances s restriction.    Baseline Pt still amb with cane and reporting difficulties with uneven surfaces (for example parking lots on incline)    Time 10    Period Weeks    Status Revised    Target Date 10/16/20      PT LONG TERM GOAL #4   Title Pt. will demonstrate Rt LE MMT 5/5 throughout to facilitate squats, stairs, standing, walking at  [facility] s limitation.    Time 10    Status Revised    Target Date 10/16/20      PT LONG TERM GOAL #5   Title Pt. will demonstrate Rt knee AROM 0-110 deg to facilitate usual transfers, squat, kneeling at PLOF s limitation.    Time 10    Status Revised    Target Date 10/16/20      Additional Long Term Goals   Additional Long Term Goals Yes      PT LONG TERM GOAL #6   Title Pt. will demonstrate Rt ankle DF > or = 10 deg to facilitate usual ambulation s restriction    Time 10    Period Weeks    Status New    Target Date 10/16/20                  Patient will benefit from skilled therapeutic intervention in order to improve the following deficits and impairments:     Visit Diagnosis: Acute pain of right knee  Stiffness of right knee, not elsewhere classified  Muscle weakness (generalized)  Difficulty in walking, not elsewhere classified  Localized edema     Problem List Patient Active Problem List   Diagnosis Date Noted  . Displaced transverse fracture of left patella, subsequent encounter for closed fracture with delayed healing   . Right patella fracture 04/20/2020    Cheryl Glass 08/29/2020, 10:51 AM  [facility] Physical Therapy [address] [place], [place], [address] Phone: [phone]   Fax:  [phone]  Name: Cheryl Glass MRN: [id number] Date of Birth: May 05, 1951  [facility] Physical Therapy [address] [place], [place], [address] Phone: [phone]   Fax:  [phone]  Physical Therapy Treatment  Patient Details  Name: Cheryl Glass MRN: [id number] Date of Birth: [birth year] Referring Provider (PT): Dr. Ophelia Charter   Encounter Date: 08/29/2020   PT End of Session - 08/29/20 1049    Visit Number 19    Number of Visits 24    Date for PT Re-Evaluation 10/16/20    Authorization Type [employer]    Authorization Time Period 06/21/2020-08/30/2020 initial.  asking for 08/08/2020 - 10/16/2020    Authorization - Visit Number 7    Authorization - Number of Visits 12    Progress Note Due on Visit 22    PT Start Time 1014    PT Stop Time 1058    PT Time Calculation (min) 44 min    Activity Tolerance Patient tolerated treatment well    Behavior During Therapy [facility] for tasks assessed/performed           Past Medical History:  Diagnosis Date  . Arthritis   . Eating disorder   . Hyperlipidemia   . Hypertension   . Osteoporosis   . Right patella fracture   . Wrist fracture 2013   right    Past Surgical History:  Procedure Laterality Date  . AUGMENTATION MAMMAPLASTY    . BREAST ENHANCEMENT SURGERY    . CESAREAN SECTION    . EXAM UNDER ANESTHESIA WITH MANIPULATION OF KNEE Right 06/06/2020   Procedure: EXAM UNDER ANESTHESIA WITH MANIPULATION OF KNEE;  Surgeon: Eldred Manges, MD;  Location: [facility];  Service: Orthopedics;  Laterality: Right;  . HARDWARE REMOVAL Right 06/06/2020   Procedure: right knee wire removal;  Surgeon: Eldred Manges, MD;  Location: [facility];  Service: Orthopedics;  Laterality: Right;  . HYSTEROSCOPY    . ORIF PATELLA Right 04/20/2020   Procedure: OPEN REDUCTION INTERNAL (ORIF) FIXATION RIGHT PATELLA;  Surgeon: Eldred Manges, MD;  [facility];  Service: Orthopedics;  Laterality: Right;    There were no vitals filed for this visit.   Subjective Assessment - 08/29/20 1048     Subjective Pt. indicated feeling like soft tissue work was helpful.    Pertinent History ORIF Rt patella 04/20/2020, wire removal 06/06/2020    Patient Stated Goals Get back to walking, hiking, normal exercise    Currently in Pain? No/denies                             [facility] Adult PT Treatment/Exercise - 08/29/20 0001      Knee/Hip Exercises: Aerobic   Recumbent Bike Full rev 10 mins lvl 4      Knee/Hip Exercises: Machines for Strengthening   Cybex Knee Extension eccentric LAQ Rt 20 lbs 3 x 10    Total Gym Leg Press Rt LE 75 lbs 3 x 15, DL 2 x 15      Manual Therapy   Manual therapy comments percussive device to Rt quad, Rt calf for mobility gains.  g4 ap mobs to Rt talocrural jt                    PT Short Term Goals - 07/06/20 1212      PT SHORT TERM GOAL #1   Title Patient will demonstrate independent use of home exercise program to maintain progress from in clinic treatments.    Time 3

## 2020-09-03 ENCOUNTER — Encounter: Payer: Self-pay | Admitting: Rehabilitative and Restorative Service Providers"

## 2020-09-03 ENCOUNTER — Ambulatory Visit: Payer: Medicare HMO | Admitting: Rehabilitative and Restorative Service Providers"

## 2020-09-03 ENCOUNTER — Other Ambulatory Visit: Payer: Self-pay

## 2020-09-03 DIAGNOSIS — M25561 Pain in right knee: Secondary | ICD-10-CM

## 2020-09-03 DIAGNOSIS — M6281 Muscle weakness (generalized): Secondary | ICD-10-CM

## 2020-09-03 DIAGNOSIS — M25661 Stiffness of right knee, not elsewhere classified: Secondary | ICD-10-CM

## 2020-09-03 DIAGNOSIS — R262 Difficulty in walking, not elsewhere classified: Secondary | ICD-10-CM

## 2020-09-03 DIAGNOSIS — R600 Localized edema: Secondary | ICD-10-CM | POA: Diagnosis not present

## 2020-09-03 NOTE — Therapy (Signed)
Visit Diagnosis: Acute pain of right knee  Stiffness of right knee, not elsewhere classified  Muscle weakness (generalized)  Difficulty in walking, not elsewhere classified  Localized edema     Problem List Patient Active Problem List   Diagnosis Date Noted  . Displaced transverse fracture of left patella, subsequent encounter for closed fracture with delayed healing   . Right patella fracture 04/20/2020    Chyrel Masson, PT, DPT, OCS, ATC 09/03/20  11:43 AM    [facility] Physical Therapy [address] [place], [place], [address] Phone: [phone]   Fax:  [phone]  Name: Mike Berntsen MRN: [id number] Date of Birth: 25-Mar-1951  [facility] Physical Therapy [address] [place], [place], [address] Phone: [phone]   Fax:  [phone]  Physical Therapy Treatment  Patient Details  Name: Xyla Leisner MRN: [id number] Date of Birth: [birth year] Referring Provider (PT): Dr. Ophelia Charter   Encounter Date: 09/03/2020   PT End of Session - 09/03/20 1121    Visit Number 20    Number of Visits 24    Date for PT Re-Evaluation 10/16/20    Authorization Type [employer]    Authorization Time Period 06/21/2020-08/30/2020 initial.  asking for 08/08/2020 - 10/16/2020    Authorization - Visit Number 8    Authorization - Number of Visits 12    Progress Note Due on Visit 22    PT Start Time 1100    PT Stop Time 1140    PT Time Calculation (min) 40 min    Activity Tolerance Patient tolerated treatment well    Behavior During Therapy [facility] for tasks assessed/performed           Past Medical History:  Diagnosis Date  . Arthritis   . Eating disorder   . Hyperlipidemia   . Hypertension   . Osteoporosis   . Right patella fracture   . Wrist fracture 2013   right    Past Surgical History:  Procedure Laterality Date  . AUGMENTATION MAMMAPLASTY    . BREAST ENHANCEMENT SURGERY    . CESAREAN SECTION    . EXAM UNDER ANESTHESIA WITH MANIPULATION OF KNEE Right 06/06/2020   Procedure: EXAM UNDER ANESTHESIA WITH MANIPULATION OF KNEE;  Surgeon: Eldred Manges, MD;  Location: [facility];  Service: Orthopedics;  Laterality: Right;  . HARDWARE REMOVAL Right 06/06/2020   Procedure: right knee wire removal;  Surgeon: Eldred Manges, MD;  Location: [facility];  Service: Orthopedics;  Laterality: Right;  . HYSTEROSCOPY    . ORIF PATELLA Right 04/20/2020   Procedure: OPEN REDUCTION INTERNAL (ORIF) FIXATION RIGHT PATELLA;  Surgeon: Eldred Manges, MD;  [facility];  Service: Orthopedics;  Laterality: Right;    There were no vitals filed for this visit.   Subjective Assessment - 09/03/20 1119      Subjective No complaints of pain in rt knee.    Pertinent History ORIF Rt patella 04/20/2020, wire removal 06/06/2020    Patient Stated Goals Get back to walking, hiking, normal exercise    Currently in Pain? No/denies    Pain Score 0-No pain                             [facility] Adult PT Treatment/Exercise - 09/03/20 0001      Neuro Re-ed    Neuro Re-ed Details  6 inch hurdle clear step over/back 20x bilateral, tandem fwd/back 15 ft x 5      Knee/Hip Exercises: Aerobic   Recumbent Bike Lvl 4 11 mins      Knee/Hip Exercises: Machines for Strengthening   Cybex Knee Extension eccentric LAQ Rt 20 lbs 3 x 10    Cybex Knee Flexion SL 20 lbs Rt LE 3 x 10      Knee/Hip Exercises: Standing   Other Standing Knee Exercises spanish squat 3 x 15 c strap support    Other Standing Knee Exercises aerobic step side to side cross overs 6 inch 20x      Ankle Exercises: Stretches   Other Stretch incline board stretch 30 sec x 3 Rt LE  Visit Diagnosis: Acute pain of right knee  Stiffness of right knee, not elsewhere classified  Muscle weakness (generalized)  Difficulty in walking, not elsewhere classified  Localized edema     Problem List Patient Active Problem List   Diagnosis Date Noted  . Displaced transverse fracture of left patella, subsequent encounter for closed fracture with delayed healing   . Right patella fracture 04/20/2020    Chyrel Masson, PT, DPT, OCS, ATC 09/03/20  11:43 AM    [facility] Physical Therapy [address] [place], [place], [address] Phone: [phone]   Fax:  [phone]  Name: Mike Berntsen MRN: [id number] Date of Birth: 25-Mar-1951

## 2020-09-05 ENCOUNTER — Encounter: Payer: Medicare HMO | Admitting: Rehabilitative and Restorative Service Providers"

## 2020-09-24 ENCOUNTER — Ambulatory Visit: Payer: Medicare HMO | Admitting: Rehabilitative and Restorative Service Providers"

## 2020-09-24 ENCOUNTER — Other Ambulatory Visit: Payer: Self-pay

## 2020-09-24 ENCOUNTER — Encounter: Payer: Self-pay | Admitting: Rehabilitative and Restorative Service Providers"

## 2020-09-24 DIAGNOSIS — M25561 Pain in right knee: Secondary | ICD-10-CM | POA: Diagnosis not present

## 2020-09-24 DIAGNOSIS — M6281 Muscle weakness (generalized): Secondary | ICD-10-CM

## 2020-09-24 DIAGNOSIS — R262 Difficulty in walking, not elsewhere classified: Secondary | ICD-10-CM | POA: Diagnosis not present

## 2020-09-24 DIAGNOSIS — R600 Localized edema: Secondary | ICD-10-CM | POA: Diagnosis not present

## 2020-09-24 DIAGNOSIS — M25661 Stiffness of right knee, not elsewhere classified: Secondary | ICD-10-CM | POA: Diagnosis not present

## 2020-09-24 NOTE — Therapy (Addendum)
[place], [place], [address] Phone: [phone]   Fax:  [phone]  Physical Therapy Treatment/Progress Note  Discharge Note  Patient Details  Name: Cheryl Glass MRN: [id number] Date of Birth: Feb 07, 1951 Referring Provider (PT): Dr. Lorin Mercy   Encounter Date: 09/24/2020   Progress Note Reporting Period 08/07/2020 to 09/24/2020  See note below for Objective Data and Assessment of Progress/Goals.        PT End of Session - 09/24/20 1215    Visit Number 21    Number of Visits 24    Date for PT Re-Evaluation 10/16/20    Authorization Type [employer]    Authorization Time Period 06/21/2020-08/30/2020 initial.  asking for 08/08/2020 - 10/16/2020    Authorization - Visit Number 9    Authorization - Number of Visits 12    Progress Note Due on Visit 24    PT Start Time 9163    PT Stop Time 1222    PT Time Calculation (min) 40 min    Activity Tolerance Patient tolerated treatment well    Behavior During Therapy WFL for tasks assessed/performed           Past Medical History:  Diagnosis Date  . Arthritis   . Eating disorder   . Hyperlipidemia   . Hypertension   . Osteoporosis   . Right patella fracture   . Wrist fracture 2013   right    Past Surgical History:  Procedure Laterality Date  . AUGMENTATION MAMMAPLASTY    . BREAST ENHANCEMENT SURGERY    . CESAREAN SECTION    . EXAM UNDER ANESTHESIA WITH MANIPULATION OF KNEE Right 06/06/2020   Procedure: EXAM UNDER ANESTHESIA WITH MANIPULATION OF KNEE;  Surgeon: Marybelle Killings, MD;  Location: [place];  Service: Orthopedics;  Laterality: Right;  . HARDWARE REMOVAL Right 06/06/2020   Procedure: right knee wire removal;  Surgeon: Marybelle Killings, MD;  Location: [place];  Service: Orthopedics;  Laterality: Right;  . HYSTEROSCOPY    . ORIF PATELLA Right 04/20/2020   Procedure: OPEN REDUCTION INTERNAL (ORIF) FIXATION RIGHT PATELLA;  Surgeon:  Marybelle Killings, MD;  Location: [place];  Service: Orthopedics;  Laterality: Right;    There were no vitals filed for this visit.   Subjective Assessment - 09/24/20 1145    Subjective Pt. reported she has been working on activities at home, still having some difficulty c balance activity.  No pain.  Pt. indicated overall improvement at 70% or so.    Pertinent History ORIF Rt patella 04/20/2020, wire removal 06/06/2020    Patient Stated Goals Get back to walking, hiking, normal exercise    Currently in Pain? No/denies    Effect of Pain on Daily Activities Still limited in outdoor activity (hiking, long distance walking/store)              [facility] PT Assessment - 09/24/20 0001      Assessment   Medical Diagnosis Rt ORIF patella, wire removal     Referring Provider (PT) Dr. Lorin Mercy    Onset Date/Surgical Date 04/20/20      Single Leg Stance   Comments Rt SLS 6 seconds, Lt 15 seconds      Sit to Stand   Comments able to perform from 18 inch table s UE assist      AROM   Overall AROM Comments DF c knee extension/flexion AROM to 0 deg neutral    Right Knee Extension 0  09/24/20 1211    Clinical Impression Statement Pt. has attended 21 visits overall during course of treatment.  See objective data for updated information.  Pt. has demonstrated continued gains overall in all areas c  Rt ankle DF and Rt knee flexion mobility deficits most evident restrictions at this time.  Both are present c DF limitation noted in ambulation gait cycle.  Continued balance gains warranted as well.  Pt. continued to demonstrate medical necessity presentation that can warrent continued skilled PT services as previously approved.  Pt. will continue to benefit c continued HEP use.    Personal Factors and Comorbidities Comorbidity 3+    Comorbidities osteoporosis, HTN, hyperlipidemia    Examination-Activity Limitations Sit;Sleep;Squat;Stairs;Stand;Locomotion Level;Lift;Transfers    Stability/Clinical Decision Making Evolving/Moderate complexity    Rehab Potential Good    PT Duration --   10   PT Treatment/Interventions ADLs/Self Care Home Management;Electrical Stimulation;Iontophoresis 53m/ml Dexamethasone;Moist Heat;Neuromuscular re-education;Ultrasound;Stair training;Functional mobility training;Gait training;Patient/family education;Therapeutic activities;Therapeutic exercise;Balance training;Manual techniques;Vasopneumatic Device;Taping;Passive range of motion;Dry needling;Spinal Manipulations;Joint Manipulations    PT Next Visit Plan Balance focus, knee flexion, DF mobiltiy gains.    PT Home Exercise Plan [id number]   Consulted and Agree with Plan of Care Patient           Patient will benefit from skilled therapeutic intervention in order to improve the following deficits and impairments:  Abnormal gait,Hypomobility,Decreased activity tolerance,Decreased strength,Pain,Difficulty walking,Decreased mobility,Decreased balance,Decreased range of motion,Impaired perceived functional ability,Impaired flexibility,Decreased coordination  Visit Diagnosis: Acute pain of right knee  Stiffness of right knee, not elsewhere classified  Muscle weakness (generalized)  Difficulty in walking, not elsewhere classified  Localized edema     Problem List Patient Active Problem List   Diagnosis Date  Noted  . Displaced transverse fracture of left patella, subsequent encounter for closed fracture with delayed healing   . Right patella fracture 04/20/2020    MScot Jun PT, DPT, OCS, ATC 09/24/20  12:19 PM  PHYSICAL THERAPY DISCHARGE SUMMARY  Visits from Start of Care: 21  Current functional level related to goals / functional outcomes: See note   Remaining deficits: See note   Education / Equipment: HEP Plan: Patient agrees to discharge.  Patient goals were partially met. Patient is being discharged due to not returning since the last visit.  ?????    MScot Jun PT, DPT, OCS, ATC 11/20/20  10:23 AM     [facility] [address] [address]Phone: [phone]  Fax:  [phone] Name: Cheryl NotaroMRN: [id number] of Birth: [birth year]  09/24/20 1211    Clinical Impression Statement Pt. has attended 21 visits overall during course of treatment.  See objective data for updated information.  Pt. has demonstrated continued gains overall in all areas c  Rt ankle DF and Rt knee flexion mobility deficits most evident restrictions at this time.  Both are present c DF limitation noted in ambulation gait cycle.  Continued balance gains warranted as well.  Pt. continued to demonstrate medical necessity presentation that can warrent continued skilled PT services as previously approved.  Pt. will continue to benefit c continued HEP use.    Personal Factors and Comorbidities Comorbidity 3+    Comorbidities osteoporosis, HTN, hyperlipidemia    Examination-Activity Limitations Sit;Sleep;Squat;Stairs;Stand;Locomotion Level;Lift;Transfers    Stability/Clinical Decision Making Evolving/Moderate complexity    Rehab Potential Good    PT Duration --   10   PT Treatment/Interventions ADLs/Self Care Home Management;Electrical Stimulation;Iontophoresis 53m/ml Dexamethasone;Moist Heat;Neuromuscular re-education;Ultrasound;Stair training;Functional mobility training;Gait training;Patient/family education;Therapeutic activities;Therapeutic exercise;Balance training;Manual techniques;Vasopneumatic Device;Taping;Passive range of motion;Dry needling;Spinal Manipulations;Joint Manipulations    PT Next Visit Plan Balance focus, knee flexion, DF mobiltiy gains.    PT Home Exercise Plan [id number]   Consulted and Agree with Plan of Care Patient           Patient will benefit from skilled therapeutic intervention in order to improve the following deficits and impairments:  Abnormal gait,Hypomobility,Decreased activity tolerance,Decreased strength,Pain,Difficulty walking,Decreased mobility,Decreased balance,Decreased range of motion,Impaired perceived functional ability,Impaired flexibility,Decreased coordination  Visit Diagnosis: Acute pain of right knee  Stiffness of right knee, not elsewhere classified  Muscle weakness (generalized)  Difficulty in walking, not elsewhere classified  Localized edema     Problem List Patient Active Problem List   Diagnosis Date  Noted  . Displaced transverse fracture of left patella, subsequent encounter for closed fracture with delayed healing   . Right patella fracture 04/20/2020    MScot Jun PT, DPT, OCS, ATC 09/24/20  12:19 PM  PHYSICAL THERAPY DISCHARGE SUMMARY  Visits from Start of Care: 21  Current functional level related to goals / functional outcomes: See note   Remaining deficits: See note   Education / Equipment: HEP Plan: Patient agrees to discharge.  Patient goals were partially met. Patient is being discharged due to not returning since the last visit.  ?????    MScot Jun PT, DPT, OCS, ATC 11/20/20  10:23 AM     [facility] [address] [place] [address]Phone: [phone]  Fax:  [phone] Name: Cheryl NotaroMRN: [id number] of Birth: [birth year]

## 2020-10-02 ENCOUNTER — Encounter: Payer: Self-pay | Admitting: Orthopaedic Surgery

## 2020-10-02 ENCOUNTER — Ambulatory Visit (INDEPENDENT_AMBULATORY_CARE_PROVIDER_SITE_OTHER): Payer: Medicare HMO | Admitting: Orthopaedic Surgery

## 2020-10-02 VITALS — BP 156/90 | HR 81 | Ht 64.0 in | Wt 130.0 lb

## 2020-10-02 DIAGNOSIS — S82041D Displaced comminuted fracture of right patella, subsequent encounter for closed fracture with routine healing: Secondary | ICD-10-CM | POA: Diagnosis not present

## 2020-10-02 NOTE — Progress Notes (Signed)
Office Visit Note   Patient: Cheryl Glass           Date of Birth: Nov 08, 1950           MRN: [id number] Visit Date: 10/02/2020              Requested by: Sigmund Hazel, MD [address] [place],  [place] [address] PCP: Sigmund Hazel, MD   Assessment & Plan: Visit Diagnoses:  1. Closed displaced comminuted fracture of right patella with routine healing, subsequent encounter     Plan: Patient is progression of walking outside she is going back to the gym we gave her some advanced exercises to work on quad strengthening and she can return on an as-needed basis.  Follow-Up Instructions: No follow-ups on file.   Orders:  No orders of the defined types were placed in this encounter.  No orders of the defined types were placed in this encounter.     Procedures: No procedures performed   Clinical Data: No additional findings.   Subjective: Chief Complaint  Patient presents with  . Right Knee - Follow-up    06/06/2020 right knee wire removal 04/20/2020 ORIF right patella    HPI follow-up patella fracture July 2021 with wire removal 06/06/2020.  She has no extension lag she is amatory without a cane.  She still has hip flexion when she goes up on a step and is continue to work on Dance movement psychotherapist.  Range of motion is good swelling is down she is happy the surgical result has been walking out in the yard and doing leg exercises.  Review of Systems updated unchanged.   Objective: Vital Signs: BP (!) 156/90   Pulse 81   Ht 5\' 4"  (1.626 m)   Wt 130 lb (59 kg)   LMP 07/10/2013   BMI 22.31 kg/m   Physical Exam Constitutional:      Appearance: She is well-developed.  HENT:     Head: Normocephalic.     Right Ear: External ear normal.     Left Ear: External ear normal.  Eyes:     Pupils: Pupils are equal, round, and reactive to light.  Neck:     Thyroid: No thyromegaly.     Trachea: No tracheal deviation.  Cardiovascular:     Rate and Rhythm: Normal rate.   Pulmonary:     Effort: Pulmonary effort is normal.  Abdominal:     Palpations: Abdomen is soft.  Skin:    General: Skin is warm and dry.  Neurological:     Mental Status: She is alert and oriented to person, place, and time.  Psychiatric:        Mood and Affect: Mood and affect normal.        Behavior: Behavior normal.     Ortho Exam 0-1 10 right and left knee.  Still slight quad strength deficit but improving.  Normal hip range of motion. Specialty Comments:  No specialty comments available.  Imaging: No results found.   PMFS History: Patient Active Problem List   Diagnosis Date Noted  . Displaced transverse fracture of left patella, subsequent encounter for closed fracture with delayed healing   . Right patella fracture 04/20/2020   Past Medical History:  Diagnosis Date  . Arthritis   . Eating disorder   . Hyperlipidemia   . Hypertension   . Osteoporosis   . Right patella fracture   . Wrist fracture 2013   right    Family History  Problem Relation  Age of Onset  . Thyroid disease Sister        hypothyroid  . Osteoporosis Maternal Grandmother   . Cancer Father        pancreatic  . Diabetes Father     Past Surgical History:  Procedure Laterality Date  . AUGMENTATION MAMMAPLASTY    . BREAST ENHANCEMENT SURGERY    . CESAREAN SECTION    . EXAM UNDER ANESTHESIA WITH MANIPULATION OF KNEE Right 06/06/2020   Procedure: EXAM UNDER ANESTHESIA WITH MANIPULATION OF KNEE;  Surgeon: Eldred Manges, MD;  Location: [facility];  Service: Orthopedics;  Laterality: Right;  . HARDWARE REMOVAL Right 06/06/2020   Procedure: right knee wire removal;  Surgeon: Eldred Manges, MD;  Location: [facility];  Service: Orthopedics;  Laterality: Right;  . HYSTEROSCOPY    . ORIF PATELLA Right 04/20/2020   Procedure: OPEN REDUCTION INTERNAL (ORIF) FIXATION RIGHT PATELLA;  Surgeon: Eldred Manges, MD;  [facility];  Service: Orthopedics;  Laterality: Right;    Social History   Occupational History  . Not on file  Tobacco Use  . Smoking status: Never Smoker  . Smokeless tobacco: Never Used  Vaping Use  . Vaping Use: Never used  Substance and Sexual Activity  . Alcohol use: No    Alcohol/week: 0.0 standard drinks  . Drug use: No  . Sexual activity: Yes    Partners: Male    Birth control/protection: Post-menopausal    Comment: husband vasectomy

## 2020-10-09 ENCOUNTER — Encounter: Payer: Medicare HMO | Admitting: Rehabilitative and Restorative Service Providers"

## 2020-10-15 ENCOUNTER — Encounter: Payer: Medicare HMO | Admitting: Rehabilitative and Restorative Service Providers"

## 2020-10-16 DIAGNOSIS — Z1211 Encounter for screening for malignant neoplasm of colon: Secondary | ICD-10-CM | POA: Diagnosis not present

## 2020-10-16 DIAGNOSIS — E7800 Pure hypercholesterolemia, unspecified: Secondary | ICD-10-CM | POA: Diagnosis not present

## 2021-01-22 DIAGNOSIS — L718 Other rosacea: Secondary | ICD-10-CM | POA: Diagnosis not present

## 2021-01-22 DIAGNOSIS — L650 Telogen effluvium: Secondary | ICD-10-CM | POA: Diagnosis not present

## 2021-01-25 ENCOUNTER — Telehealth: Payer: Self-pay | Admitting: Orthopaedic Surgery

## 2021-01-25 NOTE — Telephone Encounter (Signed)
Patient had right knee surgery last summer with Dr. Ophelia Charter and left message on voice mail asking for someone to call her back to discuss exercises and workout for her knee.     [phone]

## 2021-01-28 NOTE — Telephone Encounter (Signed)
See below

## 2021-01-28 NOTE — Telephone Encounter (Signed)
I called discussed.  

## 2021-01-30 ENCOUNTER — Ambulatory Visit: Payer: Medicare HMO | Admitting: Orthopaedic Surgery

## 2021-02-05 ENCOUNTER — Other Ambulatory Visit: Payer: Self-pay | Admitting: Family Medicine

## 2021-02-05 DIAGNOSIS — Z1231 Encounter for screening mammogram for malignant neoplasm of breast: Secondary | ICD-10-CM

## 2021-03-29 ENCOUNTER — Ambulatory Visit: Payer: Medicare HMO

## 2021-05-20 ENCOUNTER — Ambulatory Visit
Admission: RE | Admit: 2021-05-20 | Discharge: 2021-05-20 | Disposition: A | Discharge status: Home / Self Care | Payer: Medicare HMO | Source: Ambulatory Visit | Attending: Family Medicine | Admitting: Family Medicine

## 2021-05-20 ENCOUNTER — Other Ambulatory Visit: Payer: Self-pay

## 2021-05-20 DIAGNOSIS — Z1231 Encounter for screening mammogram for malignant neoplasm of breast: Secondary | ICD-10-CM | POA: Diagnosis not present

## 2021-08-02 DIAGNOSIS — M810 Age-related osteoporosis without current pathological fracture: Secondary | ICD-10-CM | POA: Diagnosis not present

## 2021-08-02 DIAGNOSIS — E7800 Pure hypercholesterolemia, unspecified: Secondary | ICD-10-CM | POA: Diagnosis not present

## 2021-08-02 DIAGNOSIS — L719 Rosacea, unspecified: Secondary | ICD-10-CM | POA: Diagnosis not present

## 2021-08-02 DIAGNOSIS — I10 Essential (primary) hypertension: Secondary | ICD-10-CM | POA: Diagnosis not present

## 2021-08-02 DIAGNOSIS — M25561 Pain in right knee: Secondary | ICD-10-CM | POA: Diagnosis not present

## 2021-08-09 ENCOUNTER — Other Ambulatory Visit: Payer: Self-pay | Admitting: Family Medicine

## 2021-08-09 DIAGNOSIS — M810 Age-related osteoporosis without current pathological fracture: Secondary | ICD-10-CM

## 2021-10-25 DIAGNOSIS — E7800 Pure hypercholesterolemia, unspecified: Secondary | ICD-10-CM | POA: Diagnosis not present

## 2021-10-25 DIAGNOSIS — M810 Age-related osteoporosis without current pathological fracture: Secondary | ICD-10-CM | POA: Diagnosis not present

## 2021-10-25 DIAGNOSIS — E559 Vitamin D deficiency, unspecified: Secondary | ICD-10-CM | POA: Diagnosis not present

## 2021-12-21 DIAGNOSIS — H524 Presbyopia: Secondary | ICD-10-CM | POA: Diagnosis not present

## 2022-01-13 IMAGING — CR DG KNEE COMPLETE 4+V*R*
4 series · 4 of 4 positions shown · non-contrast
Comparison: None.

CLINICAL DATA: Right knee pain after fall

EXAM:
RIGHT KNEE - COMPLETE 4+ VIEW

[x knee ap right]
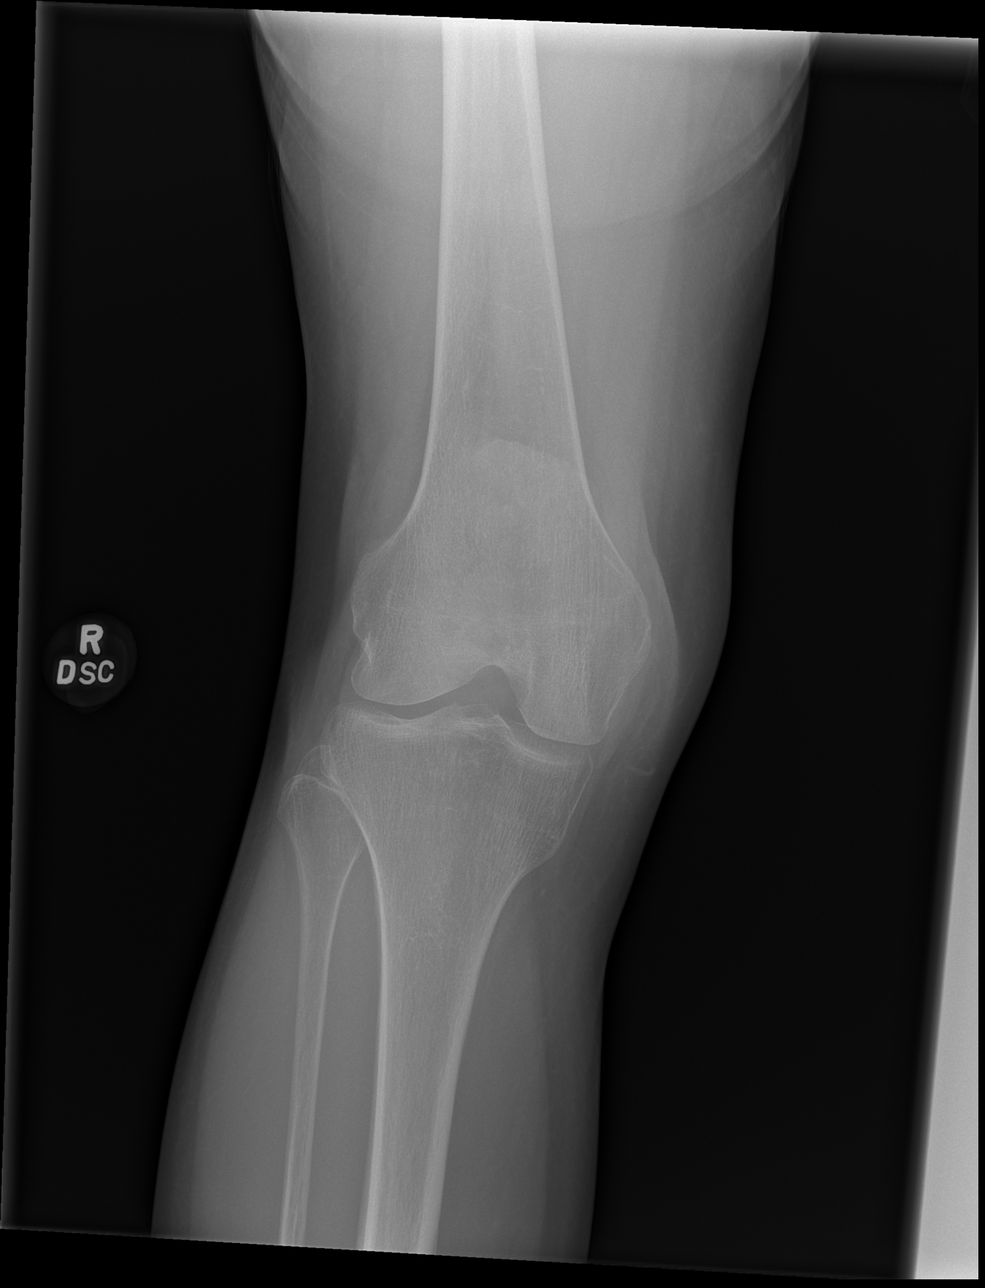

[x knee lat right (1 of 3)]
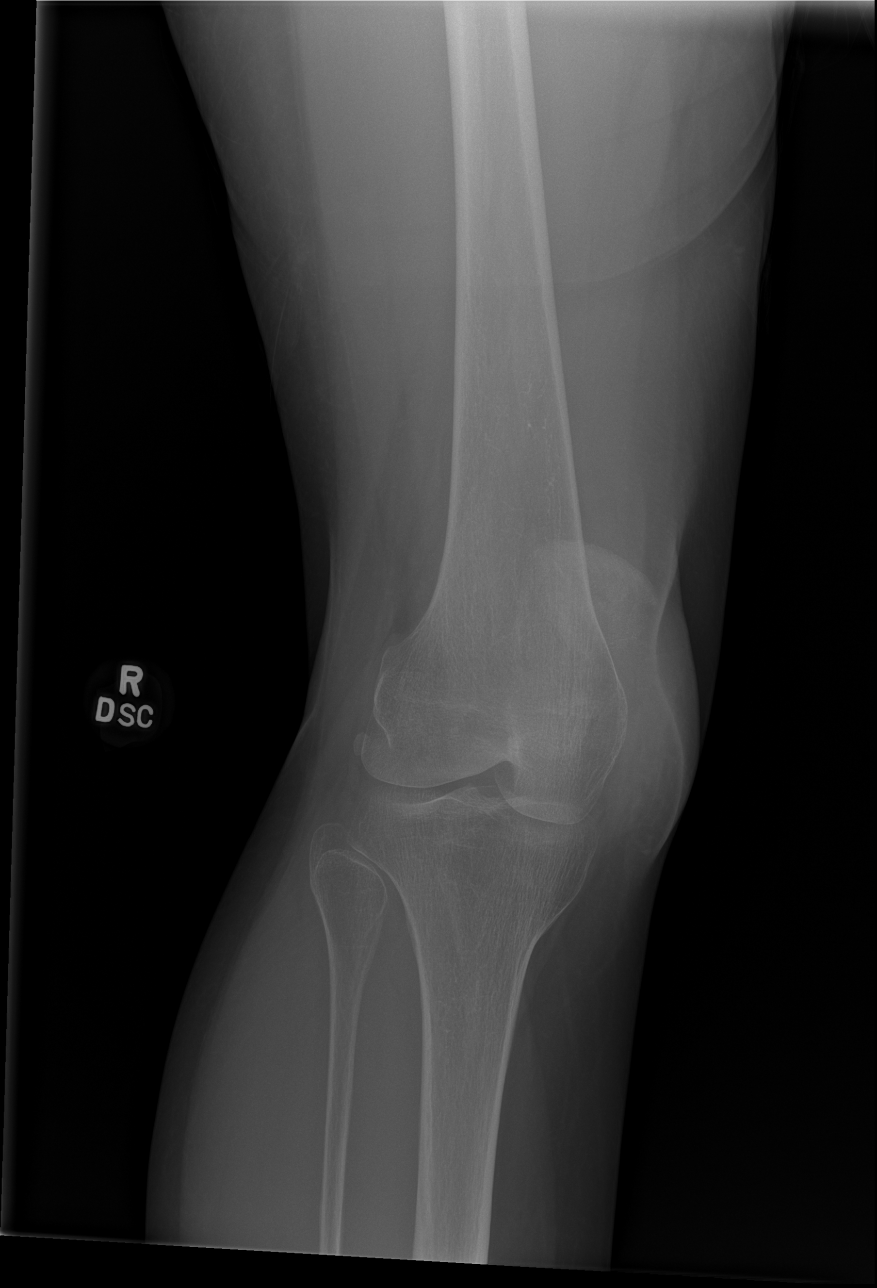

[x knee lat right (2 of 3)]
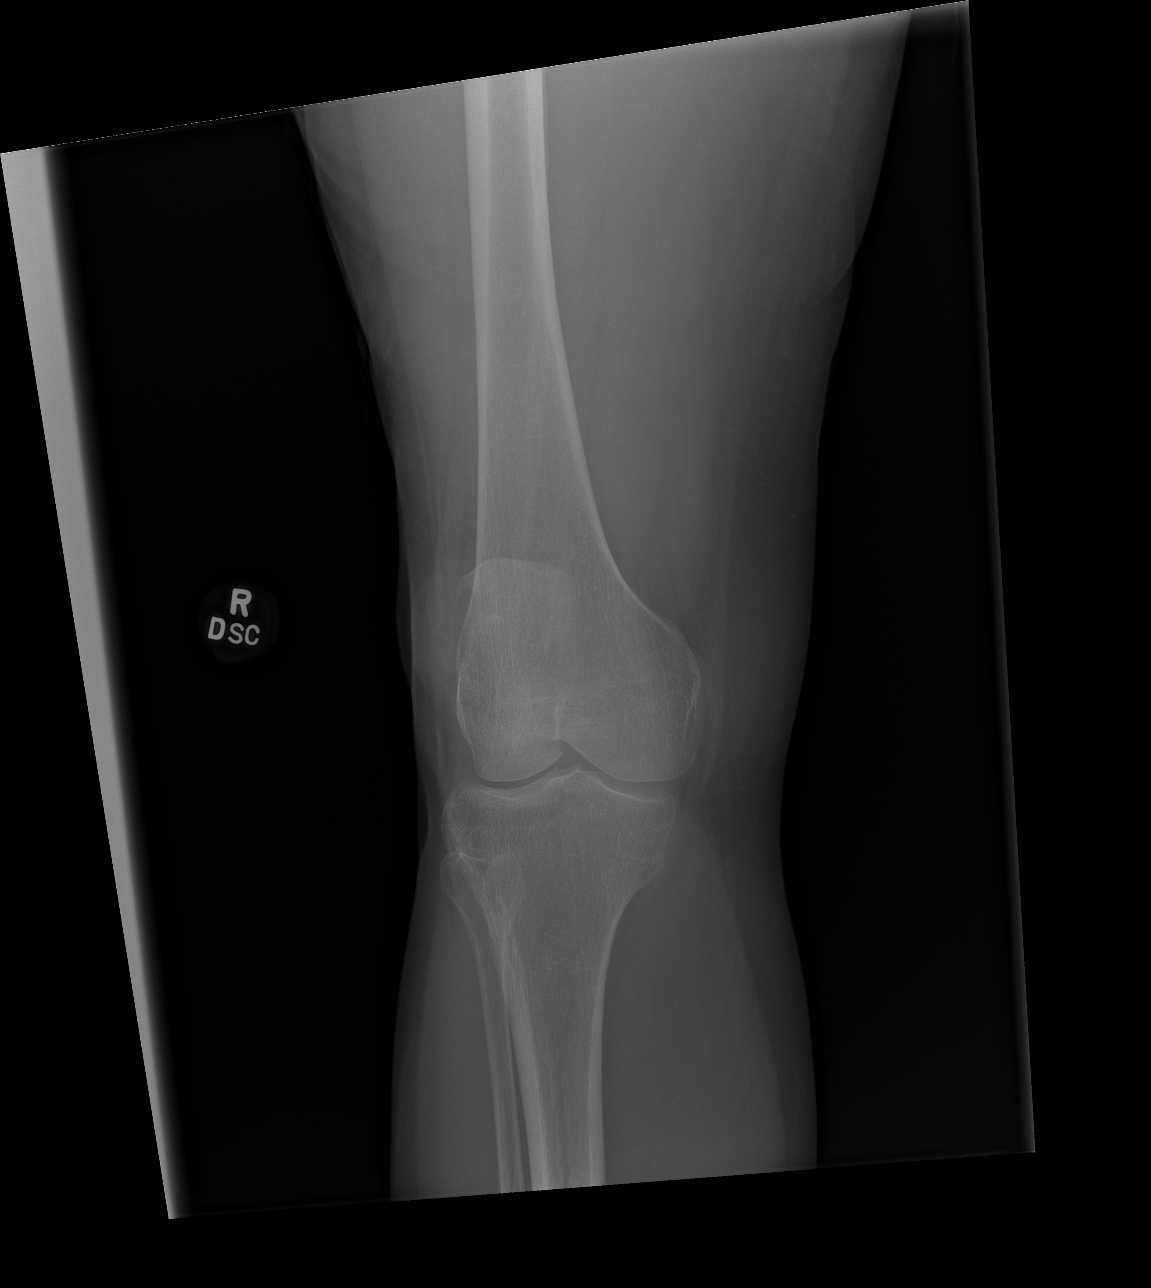

[x knee lat right (3 of 3)]
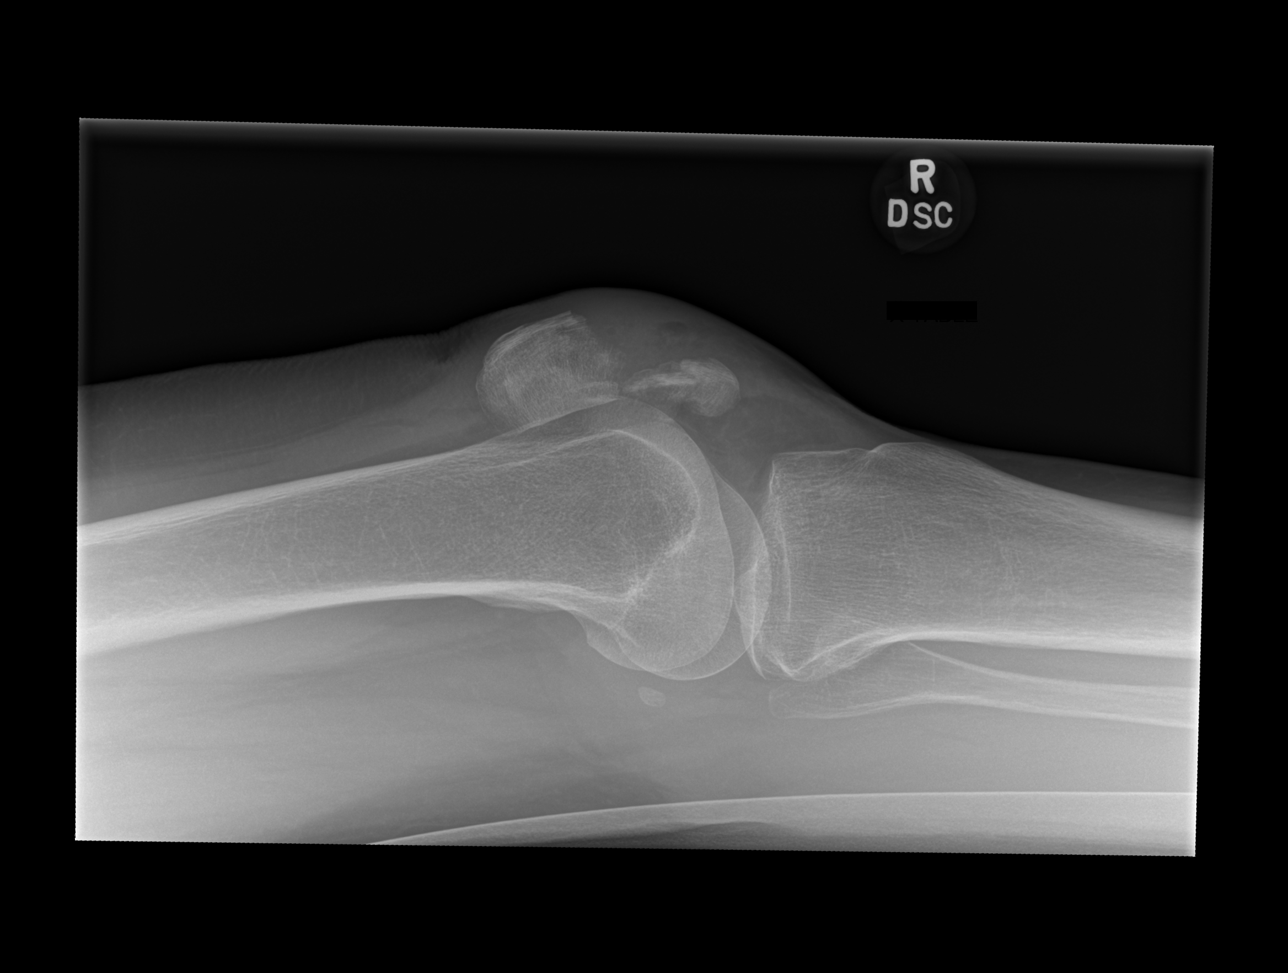

[4 of 4 positions shown; findings below may reference images not displayed]

FINDINGS: Acute comminuted fracture of the mid patella with up to 2.5 cm of
distraction of the fracture fragments. There is overlying soft
tissue swelling. The visualized femur, tibia, and fibula appear
intact. Tibiofemoral joint spaces are relatively preserved. Small
joint effusion.
IMPRESSION: Acute comminuted fracture of the mid patella with up to 2.5 cm of
distraction of the fracture fragments.

## 2022-01-16 IMAGING — RF DG KNEE 3 VIEWS*R*
1 series · 2 of 2 positions shown · non-contrast
Comparison: April 17, 2020.

CLINICAL DATA: Open reduction and internal fixation of right
patellar fracture.

EXAM:
RIGHT KNEE - 3 VIEW; DG C-ARM 1-60 MIN
Radiation exposure index: 0.6965 mGy.

[Series 1: unknown protocol · 0.14mm/px · 2 of 2 slices shown]
[im 1/2]
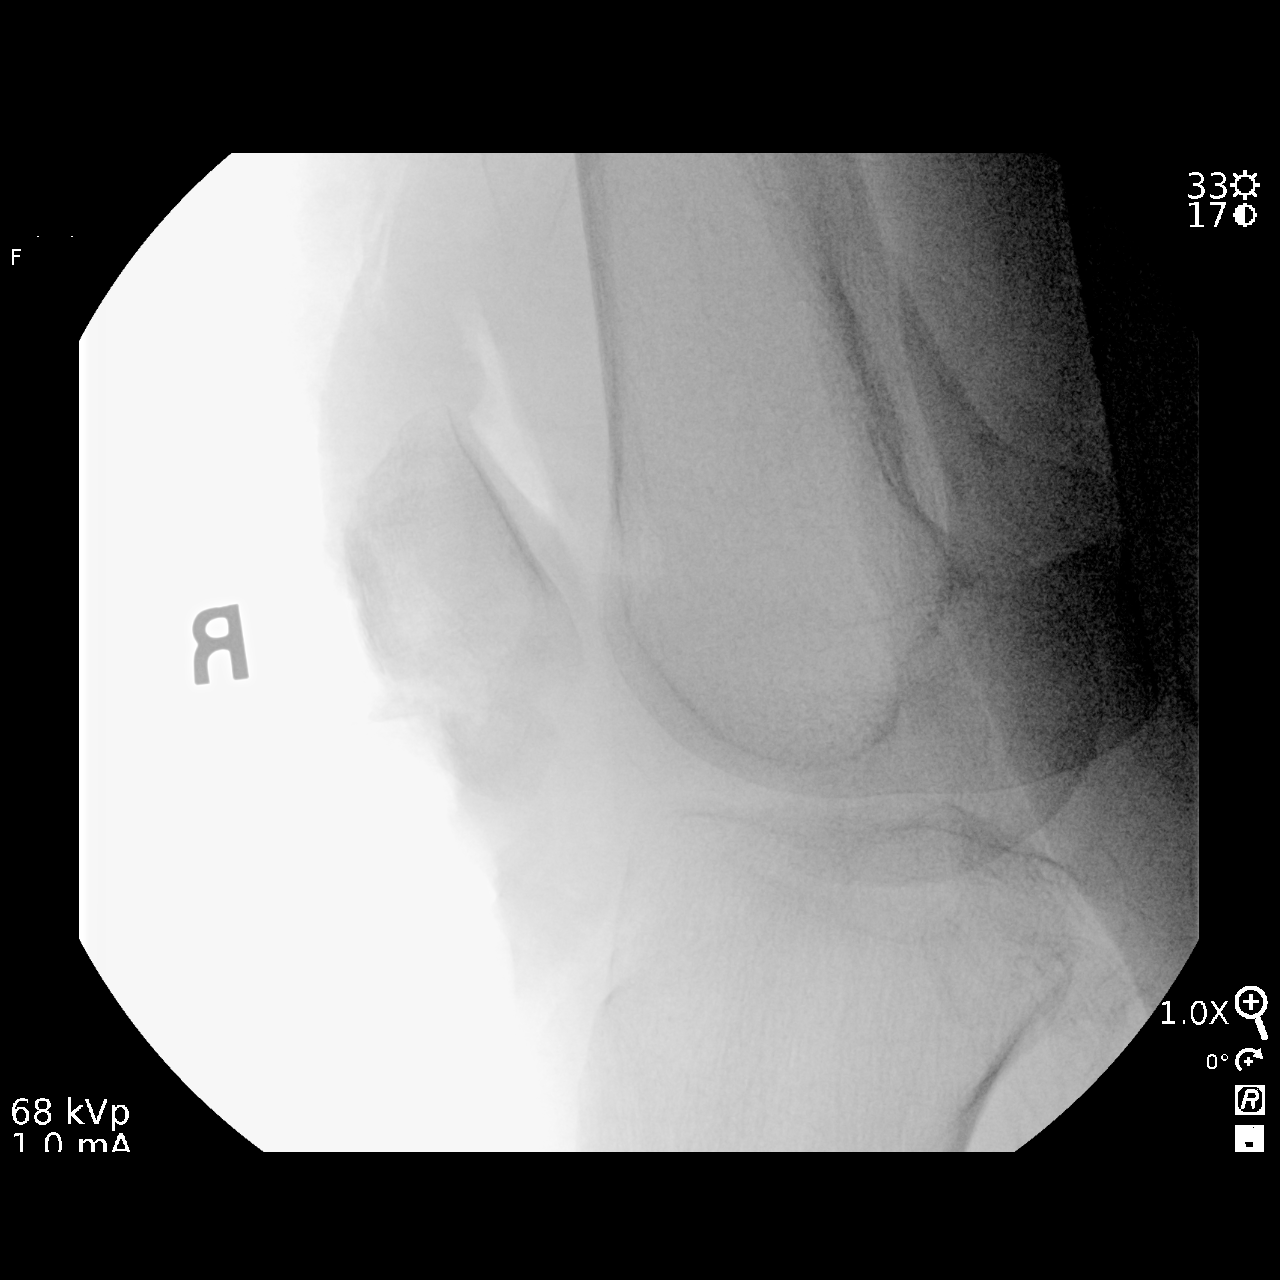
[im 2/2]
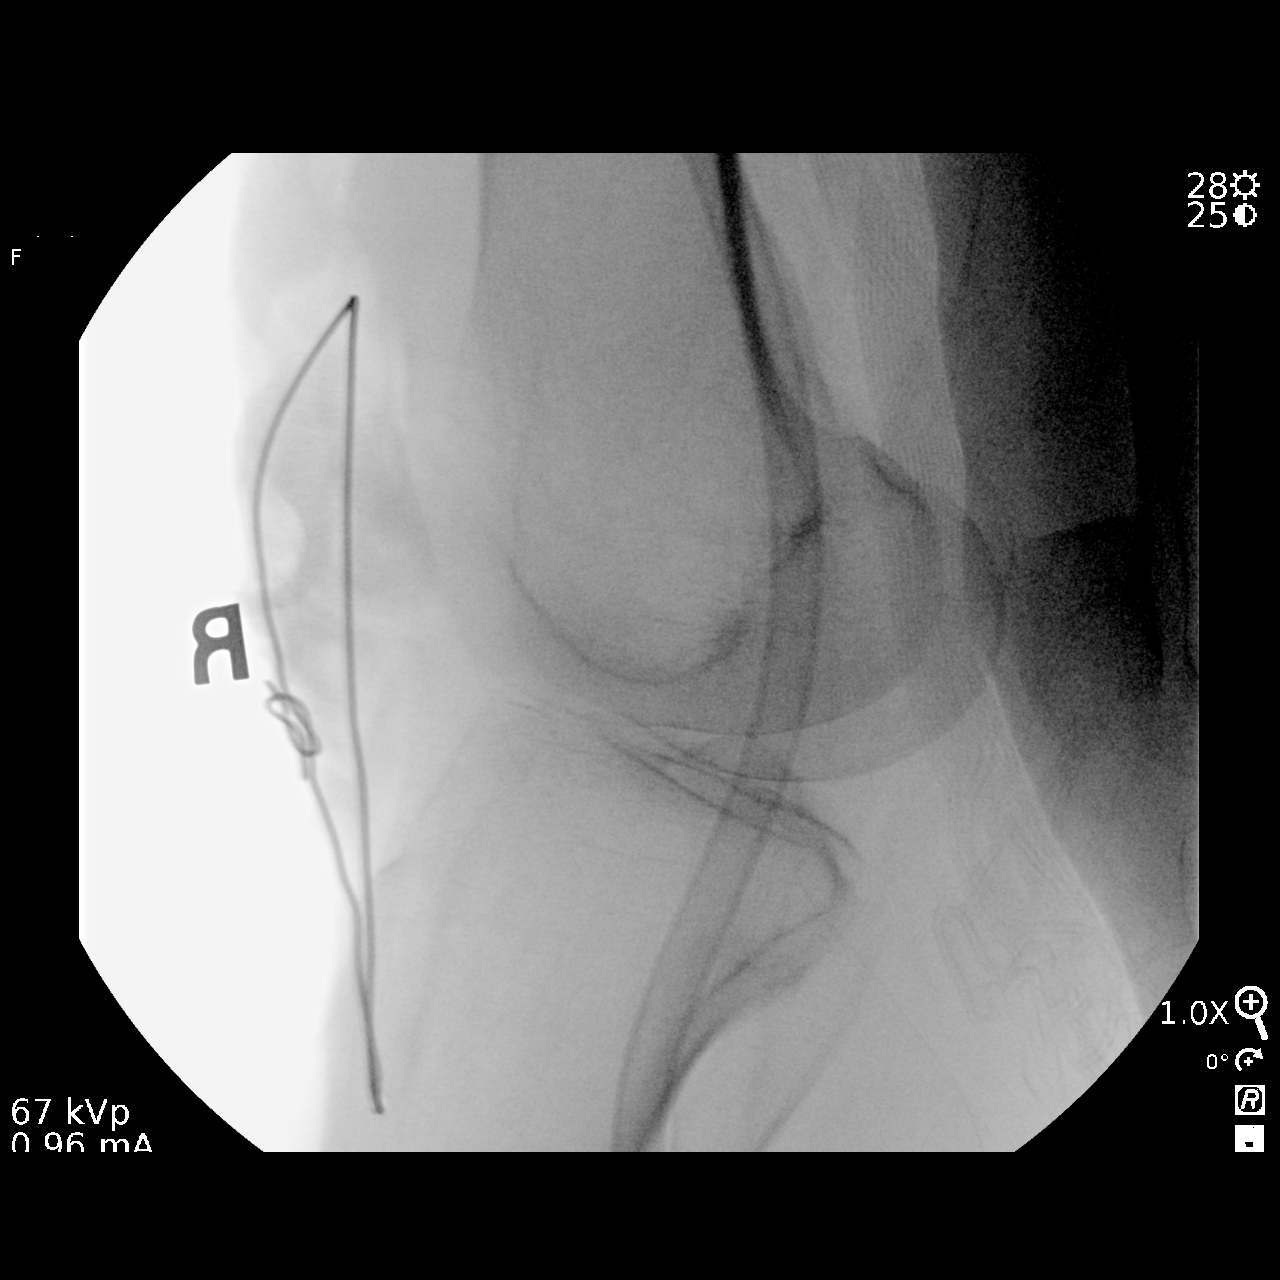

[2 of 2 positions shown; findings below may reference images not displayed]

FINDINGS: Two intraoperative fluoroscopic images were obtained of the right
knee. These images demonstrate surgical internal fixation of
patellar fracture.
IMPRESSION: Fluoroscopic guidance provided during open reduction and internal
fixation of right patellar fracture.

## 2022-01-16 IMAGING — RF DG C-ARM 1-60 MIN
1 series · 2 of 2 positions shown · non-contrast
Comparison: April 17, 2020.

CLINICAL DATA: Open reduction and internal fixation of right
patellar fracture.

EXAM:
RIGHT KNEE - 3 VIEW; DG C-ARM 1-60 MIN
Radiation exposure index: 0.6965 mGy.

[Series 1: unknown protocol · 0.14mm/px · 2 of 2 slices shown]
[im 1/2]
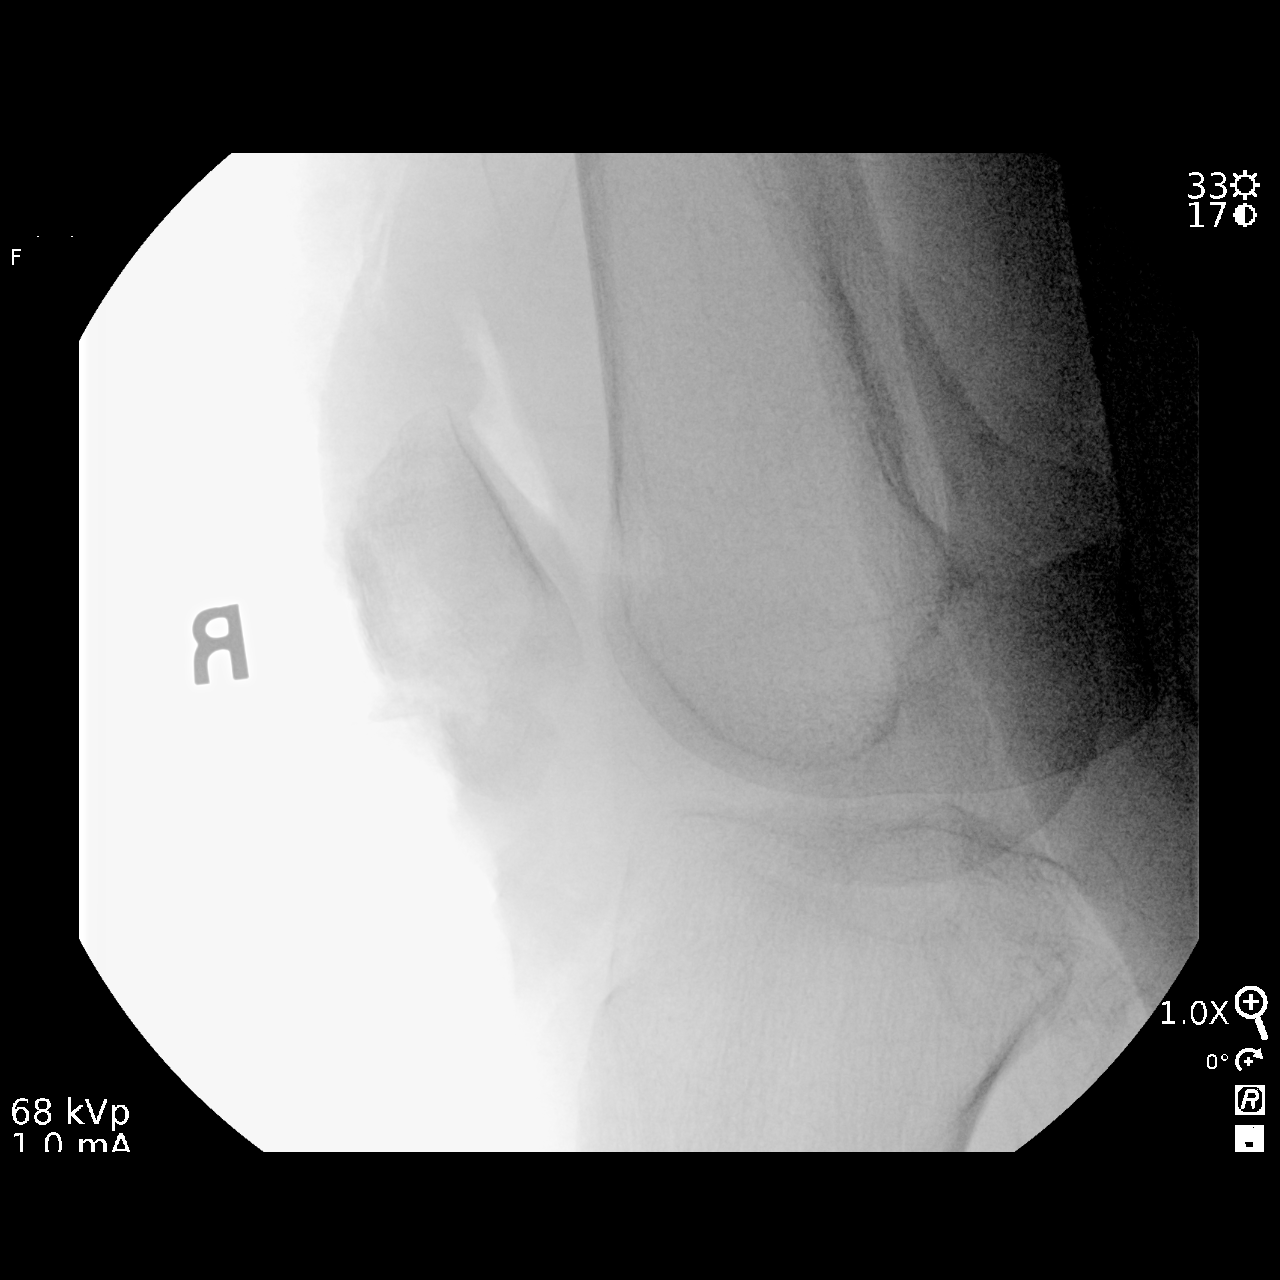
[im 2/2]
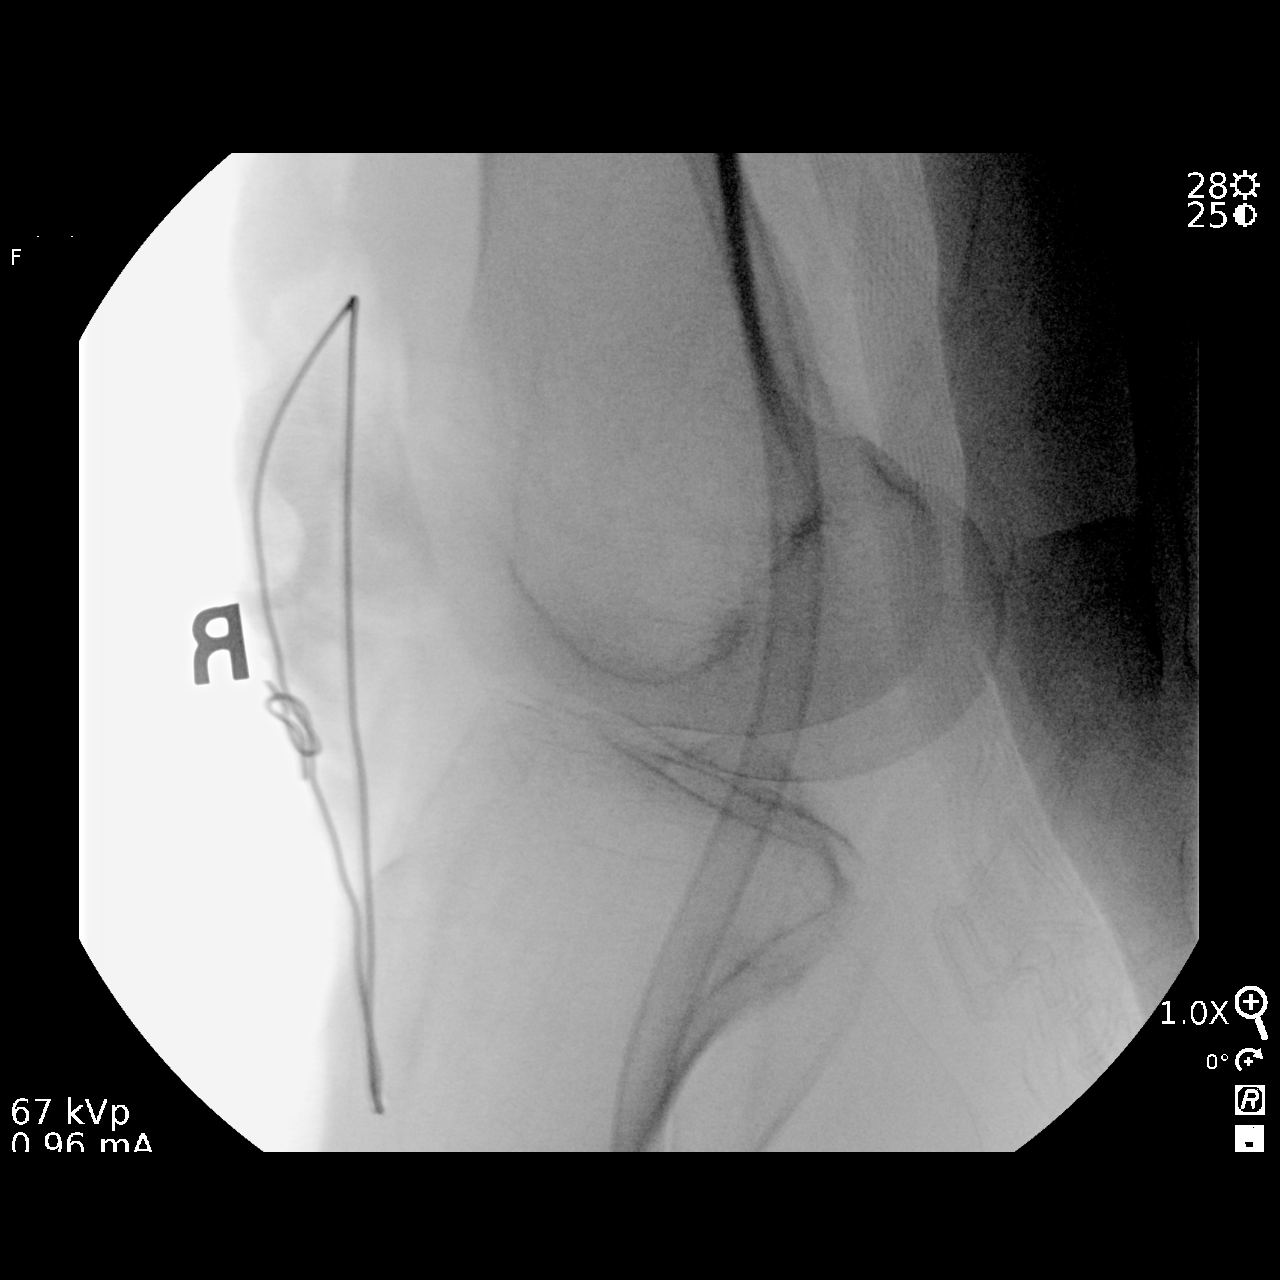

[2 of 2 positions shown; findings below may reference images not displayed]

FINDINGS: Two intraoperative fluoroscopic images were obtained of the right
knee. These images demonstrate surgical internal fixation of
patellar fracture.
IMPRESSION: Fluoroscopic guidance provided during open reduction and internal
fixation of right patellar fracture.

## 2022-01-20 ENCOUNTER — Ambulatory Visit
Admission: RE | Admit: 2022-01-20 | Discharge: 2022-01-20 | Disposition: A | Discharge status: Home / Self Care | Payer: Medicare HMO | Source: Ambulatory Visit | Attending: Family Medicine | Admitting: Family Medicine

## 2022-01-20 DIAGNOSIS — Z780 Asymptomatic menopausal state: Secondary | ICD-10-CM | POA: Diagnosis not present

## 2022-01-20 DIAGNOSIS — M810 Age-related osteoporosis without current pathological fracture: Secondary | ICD-10-CM

## 2022-01-23 DIAGNOSIS — B079 Viral wart, unspecified: Secondary | ICD-10-CM | POA: Diagnosis not present

## 2022-01-23 DIAGNOSIS — M79672 Pain in left foot: Secondary | ICD-10-CM | POA: Diagnosis not present

## 2022-01-23 DIAGNOSIS — M21612 Bunion of left foot: Secondary | ICD-10-CM | POA: Diagnosis not present

## 2022-01-23 DIAGNOSIS — L600 Ingrowing nail: Secondary | ICD-10-CM | POA: Diagnosis not present

## 2022-02-11 DIAGNOSIS — M810 Age-related osteoporosis without current pathological fracture: Secondary | ICD-10-CM | POA: Diagnosis not present

## 2022-02-14 ENCOUNTER — Other Ambulatory Visit (HOSPITAL_COMMUNITY): Payer: Self-pay | Admitting: *Deleted

## 2022-02-17 ENCOUNTER — Ambulatory Visit (HOSPITAL_COMMUNITY)
Admission: RE | Admit: 2022-02-17 | Discharge: 2022-02-17 | Disposition: A | Discharge status: Home / Self Care | Payer: Medicare HMO | Source: Ambulatory Visit | Attending: Family Medicine | Admitting: Family Medicine

## 2022-02-17 DIAGNOSIS — M810 Age-related osteoporosis without current pathological fracture: Secondary | ICD-10-CM | POA: Diagnosis not present

## 2022-02-17 MED ORDER — ZOLEDRONIC ACID 5 MG/100ML IV SOLN
INTRAVENOUS | Status: AC
  Administered 2022-02-17: 5 mg via INTRAVENOUS
  Filled 2022-02-17: qty 100

## 2022-02-17 MED ORDER — ZOLEDRONIC ACID 5 MG/100ML IV SOLN: 5.0000 mg | Freq: Once | INTRAVENOUS | Status: AC

## 2022-03-28 DIAGNOSIS — T148XXA Other injury of unspecified body region, initial encounter: Secondary | ICD-10-CM | POA: Diagnosis not present

## 2022-03-28 DIAGNOSIS — L821 Other seborrheic keratosis: Secondary | ICD-10-CM | POA: Diagnosis not present

## 2022-03-28 DIAGNOSIS — L218 Other seborrheic dermatitis: Secondary | ICD-10-CM | POA: Diagnosis not present

## 2022-03-28 DIAGNOSIS — L718 Other rosacea: Secondary | ICD-10-CM | POA: Diagnosis not present

## 2022-04-09 ENCOUNTER — Other Ambulatory Visit: Payer: Self-pay | Admitting: Family Medicine

## 2022-04-09 DIAGNOSIS — Z1231 Encounter for screening mammogram for malignant neoplasm of breast: Secondary | ICD-10-CM

## 2022-05-15 DIAGNOSIS — M25552 Pain in left hip: Secondary | ICD-10-CM | POA: Diagnosis not present

## 2022-05-21 ENCOUNTER — Ambulatory Visit
Admission: RE | Admit: 2022-05-21 | Discharge: 2022-05-21 | Disposition: A | Discharge status: Home / Self Care | Payer: Medicare HMO | Source: Ambulatory Visit | Attending: Family Medicine | Admitting: Family Medicine

## 2022-05-21 DIAGNOSIS — Z1231 Encounter for screening mammogram for malignant neoplasm of breast: Secondary | ICD-10-CM | POA: Diagnosis not present

## 2022-05-26 DIAGNOSIS — M25552 Pain in left hip: Secondary | ICD-10-CM | POA: Diagnosis not present

## 2022-06-02 DIAGNOSIS — M25552 Pain in left hip: Secondary | ICD-10-CM | POA: Diagnosis not present

## 2022-06-10 DIAGNOSIS — M25552 Pain in left hip: Secondary | ICD-10-CM | POA: Diagnosis not present

## 2022-06-16 DIAGNOSIS — M25552 Pain in left hip: Secondary | ICD-10-CM | POA: Diagnosis not present

## 2022-06-19 DIAGNOSIS — M25552 Pain in left hip: Secondary | ICD-10-CM | POA: Diagnosis not present

## 2022-06-23 DIAGNOSIS — M25552 Pain in left hip: Secondary | ICD-10-CM | POA: Diagnosis not present

## 2022-06-30 DIAGNOSIS — M25552 Pain in left hip: Secondary | ICD-10-CM | POA: Diagnosis not present

## 2022-07-08 DIAGNOSIS — M25552 Pain in left hip: Secondary | ICD-10-CM | POA: Diagnosis not present

## 2022-07-14 DIAGNOSIS — M25552 Pain in left hip: Secondary | ICD-10-CM | POA: Diagnosis not present

## 2022-07-21 DIAGNOSIS — M25552 Pain in left hip: Secondary | ICD-10-CM | POA: Diagnosis not present

## 2022-07-28 DIAGNOSIS — M25552 Pain in left hip: Secondary | ICD-10-CM | POA: Diagnosis not present

## 2022-08-04 DIAGNOSIS — I10 Essential (primary) hypertension: Secondary | ICD-10-CM | POA: Diagnosis not present

## 2022-08-04 DIAGNOSIS — M810 Age-related osteoporosis without current pathological fracture: Secondary | ICD-10-CM | POA: Diagnosis not present

## 2022-08-04 DIAGNOSIS — E7800 Pure hypercholesterolemia, unspecified: Secondary | ICD-10-CM | POA: Diagnosis not present

## 2022-08-04 DIAGNOSIS — L719 Rosacea, unspecified: Secondary | ICD-10-CM | POA: Diagnosis not present

## 2022-08-04 DIAGNOSIS — M25561 Pain in right knee: Secondary | ICD-10-CM | POA: Diagnosis not present

## 2022-08-05 DIAGNOSIS — M25552 Pain in left hip: Secondary | ICD-10-CM | POA: Diagnosis not present

## 2022-08-11 DIAGNOSIS — M25552 Pain in left hip: Secondary | ICD-10-CM | POA: Diagnosis not present

## 2022-08-20 DIAGNOSIS — M25552 Pain in left hip: Secondary | ICD-10-CM | POA: Diagnosis not present

## 2022-12-30 DIAGNOSIS — H2513 Age-related nuclear cataract, bilateral: Secondary | ICD-10-CM | POA: Diagnosis not present

## 2023-01-07 DIAGNOSIS — L718 Other rosacea: Secondary | ICD-10-CM | POA: Diagnosis not present

## 2023-01-07 DIAGNOSIS — L821 Other seborrheic keratosis: Secondary | ICD-10-CM | POA: Diagnosis not present

## 2023-02-23 DIAGNOSIS — E7800 Pure hypercholesterolemia, unspecified: Secondary | ICD-10-CM | POA: Diagnosis not present

## 2023-02-23 DIAGNOSIS — I10 Essential (primary) hypertension: Secondary | ICD-10-CM | POA: Diagnosis not present

## 2023-03-04 DIAGNOSIS — E7800 Pure hypercholesterolemia, unspecified: Secondary | ICD-10-CM | POA: Diagnosis not present

## 2023-03-04 DIAGNOSIS — M810 Age-related osteoporosis without current pathological fracture: Secondary | ICD-10-CM | POA: Diagnosis not present

## 2023-03-04 DIAGNOSIS — Z6820 Body mass index (BMI) 20.0-20.9, adult: Secondary | ICD-10-CM | POA: Diagnosis not present

## 2023-03-04 DIAGNOSIS — M25552 Pain in left hip: Secondary | ICD-10-CM | POA: Diagnosis not present

## 2023-03-04 DIAGNOSIS — I10 Essential (primary) hypertension: Secondary | ICD-10-CM | POA: Diagnosis not present

## 2023-03-16 DIAGNOSIS — Z0000 Encounter for general adult medical examination without abnormal findings: Secondary | ICD-10-CM | POA: Diagnosis not present

## 2023-03-16 DIAGNOSIS — M25552 Pain in left hip: Secondary | ICD-10-CM | POA: Diagnosis not present

## 2023-03-17 ENCOUNTER — Other Ambulatory Visit (HOSPITAL_COMMUNITY): Payer: Self-pay | Admitting: *Deleted

## 2023-03-18 ENCOUNTER — Ambulatory Visit (HOSPITAL_COMMUNITY)
Admission: RE | Admit: 2023-03-18 | Discharge: 2023-03-18 | Disposition: A | Discharge status: Home / Self Care | Payer: Medicare HMO | Source: Ambulatory Visit | Attending: Family Medicine | Admitting: Family Medicine

## 2023-03-18 DIAGNOSIS — M810 Age-related osteoporosis without current pathological fracture: Secondary | ICD-10-CM | POA: Insufficient documentation

## 2023-03-18 MED ORDER — ZOLEDRONIC ACID 5 MG/100ML IV SOLN: 5.0000 mg | Freq: Once | INTRAVENOUS | Status: AC

## 2023-03-18 MED ORDER — ZOLEDRONIC ACID 5 MG/100ML IV SOLN
INTRAVENOUS | Status: AC
  Administered 2023-03-18: 5 mg via INTRAVENOUS
  Filled 2023-03-18: qty 100

## 2023-03-18 NOTE — Progress Notes (Signed)
Pt had gotten her IV started and states she feels like she is going to faint.  Head down, feet up, cool rag on forehead.  Pt states this is "normal" for her.  Quickly recovered without passing out.  Given ice water and states she now feels fines.  VItal signs remained stable.  See flowsheet

## 2023-03-30 ENCOUNTER — Ambulatory Visit: Payer: Medicare HMO | Admitting: Family Medicine

## 2023-03-30 VITALS — BP 138/88 | Ht 64.0 in | Wt 120.0 lb

## 2023-03-30 DIAGNOSIS — M25552 Pain in left hip: Secondary | ICD-10-CM | POA: Diagnosis not present

## 2023-03-30 NOTE — Progress Notes (Unsigned)
PCP: Sigmund Hazel, MD  Subjective:   HPI: Patient is a 72 y.o. female here for left lateral hip pain for >1 year.  Pain started shortly after she fractured her R patella and needed to use a knee scooter for mobility for several weeks.  Pain now comes and goes, walking can be particularly bothersome, though she continues to walk miles per day as part of her strict exercise routine.  Pain is always lateral and is worst just posterior to the greater trochanter. Saw Dr. Lyn Hollingshead for the same and he recommended that she follow-up here for evaluation for possible shockwave therapy. Per her report, during his first evaluation Dr. Lyn Hollingshead was suspicious for bursitis, but has since become more convinced it may be "related to my tendons."  Past Medical History:  Diagnosis Date   Arthritis    Eating disorder    Hyperlipidemia    Hypertension    Osteoporosis    Right patella fracture    Wrist fracture 2013   right    Current Outpatient Medications on File Prior to Visit  Medication Sig Dispense Refill   alendronate (FOSAMAX) 70 MG tablet Take 70 mg by mouth every Sunday. Take with a full glass of water on an empty stomach.     ascorbic acid (VITAMIN C) 500 MG tablet Take 500 mg by mouth daily.     atorvastatin (LIPITOR) 10 MG tablet Take 10 mg by mouth daily.  3   Biotin w/ Vitamins C & E (HAIR SKIN & NAILS GUMMIES PO) Take 3 tablets by mouth daily.     cholecalciferol (VITAMIN D3) 25 MCG (1000 UNIT) tablet Take 1,000 Units by mouth daily.     Cyanocobalamin (B-12 PO) Take 1 tablet by mouth daily.     diclofenac Sodium (VOLTAREN) 1 % GEL      lisinopril (PRINIVIL,ZESTRIL) 10 MG tablet Take 10 mg by mouth daily.     Pediatric Multiple Vitamins (CHEWABLE MULTIPLE VITAMINS PO) Take 2 tablets by mouth daily.     No current facility-administered medications on file prior to visit.    Past Surgical History:  Procedure Laterality Date   AUGMENTATION MAMMAPLASTY     BREAST ENHANCEMENT SURGERY      CESAREAN SECTION     EXAM UNDER ANESTHESIA WITH MANIPULATION OF KNEE Right 06/06/2020   Procedure: EXAM UNDER ANESTHESIA WITH MANIPULATION OF KNEE;  Surgeon: Eldred Manges, MD;  Location: [facility];  Service: Orthopedics;  Laterality: Right;   HARDWARE REMOVAL Right 06/06/2020   Procedure: right knee wire removal;  Surgeon: Eldred Manges, MD;  Location: [facility];  Service: Orthopedics;  Laterality: Right;   HYSTEROSCOPY     ORIF PATELLA Right 04/20/2020   Procedure: OPEN REDUCTION INTERNAL (ORIF) FIXATION RIGHT PATELLA;  Surgeon: Eldred Manges, MD;  Location: [facility];  Service: Orthopedics;  Laterality: Right;    Allergies  Allergen Reactions   Oxycodone Itching    BP 138/88   Ht 5\' 4"  (1.626 m)   Wt 120 lb (54.4 kg)   LMP 07/10/2013   BMI 20.60 kg/m       No data to display              No data to display              Objective:  Physical Exam:  Gen: NAD, comfortable in exam room  MSK:  Left hip without deformity. No bruising.  Mild tenderness to palpation posterior to greater trochanter.  FROM about the joint. Lateral pain with forced external rotation.   Neg Log Roll. Neg FABER, Does have lateral pain with FADIR.    Assessment & Plan:  1. Left Lateral Hip Pain History and exam consistent with greater trochanteric pain syndrome. Difficult to parse exactly what the source of her pain is, but it seems to be coming from her external rotators/possibly a glute tendinopathy. Her rigorous exercise routine may be contributing to the duration of symptoms. Expect this to improve with adequate rehab and relative rest. Do not see an indication for a trochanteric bursa injection. Discussed the option of shockwave therapy, and made a shared decision with patient to defer at this time.  - She has been referred to PT with [facility], they should be able to give her targeted rehab exercises - Advised to take time off from the activities that are most  bothersome.  - Return in 6 weeks. If not improving at that time could consider shockwave tx.   Eliezer Mccoy, MD

## 2023-03-30 NOTE — Patient Instructions (Signed)
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 Heat over area of pain 3-4 times a day for 15 minutes at a time Hip side raise exercise 3 sets of 10 once a day - add weights if this becomes too easy. Standing hip rotations also. Aleve or ibuprofen only if needed. Start physical therapy - set something up with the [facility] therapy folks who called you. Follow up with me in 5-6 weeks.

## 2023-03-31 ENCOUNTER — Encounter: Payer: Self-pay | Admitting: Family Medicine

## 2023-04-08 ENCOUNTER — Other Ambulatory Visit: Payer: Self-pay | Admitting: Family Medicine

## 2023-04-08 DIAGNOSIS — Z1231 Encounter for screening mammogram for malignant neoplasm of breast: Secondary | ICD-10-CM

## 2023-04-14 DIAGNOSIS — M25552 Pain in left hip: Secondary | ICD-10-CM | POA: Diagnosis not present

## 2023-04-30 DIAGNOSIS — M25552 Pain in left hip: Secondary | ICD-10-CM | POA: Diagnosis not present

## 2023-05-04 ENCOUNTER — Ambulatory Visit: Payer: Medicare HMO | Admitting: Family Medicine

## 2023-05-06 DIAGNOSIS — M25552 Pain in left hip: Secondary | ICD-10-CM | POA: Diagnosis not present

## 2023-05-12 DIAGNOSIS — M25552 Pain in left hip: Secondary | ICD-10-CM | POA: Diagnosis not present

## 2023-05-21 DIAGNOSIS — M25552 Pain in left hip: Secondary | ICD-10-CM | POA: Diagnosis not present

## 2023-05-25 ENCOUNTER — Ambulatory Visit
Admission: RE | Admit: 2023-05-25 | Discharge: 2023-05-25 | Disposition: A | Discharge status: Home / Self Care | Payer: Medicare HMO | Source: Ambulatory Visit | Attending: Family Medicine | Admitting: Family Medicine

## 2023-05-25 DIAGNOSIS — Z1231 Encounter for screening mammogram for malignant neoplasm of breast: Secondary | ICD-10-CM

## 2023-07-13 ENCOUNTER — Other Ambulatory Visit: Payer: Self-pay | Admitting: Family Medicine

## 2023-07-13 DIAGNOSIS — L719 Rosacea, unspecified: Secondary | ICD-10-CM | POA: Diagnosis not present

## 2023-07-13 DIAGNOSIS — Z1211 Encounter for screening for malignant neoplasm of colon: Secondary | ICD-10-CM | POA: Diagnosis not present

## 2023-07-13 DIAGNOSIS — Z0000 Encounter for general adult medical examination without abnormal findings: Secondary | ICD-10-CM | POA: Diagnosis not present

## 2023-07-13 DIAGNOSIS — I10 Essential (primary) hypertension: Secondary | ICD-10-CM | POA: Diagnosis not present

## 2023-07-13 DIAGNOSIS — Z6820 Body mass index (BMI) 20.0-20.9, adult: Secondary | ICD-10-CM | POA: Diagnosis not present

## 2023-07-13 DIAGNOSIS — E7800 Pure hypercholesterolemia, unspecified: Secondary | ICD-10-CM | POA: Diagnosis not present

## 2023-07-13 DIAGNOSIS — M810 Age-related osteoporosis without current pathological fracture: Secondary | ICD-10-CM

## 2023-07-16 ENCOUNTER — Inpatient Hospital Stay (HOSPITAL_COMMUNITY)
Admission: EM | Admit: 2023-07-16 | Discharge: 2023-07-18 | DRG: 522 | Disposition: A | Discharge status: Home / Self Care | Payer: Medicare HMO | Attending: Internal Medicine | Admitting: Internal Medicine

## 2023-07-16 ENCOUNTER — Emergency Department (HOSPITAL_COMMUNITY): Payer: Medicare HMO

## 2023-07-16 ENCOUNTER — Other Ambulatory Visit: Payer: Self-pay

## 2023-07-16 ENCOUNTER — Inpatient Hospital Stay (HOSPITAL_COMMUNITY): Payer: Medicare HMO

## 2023-07-16 ENCOUNTER — Encounter (HOSPITAL_COMMUNITY)
Admission: EM | Disposition: A | Discharge status: Home / Self Care | Payer: Self-pay | Source: Home / Self Care | Attending: Internal Medicine

## 2023-07-16 ENCOUNTER — Other Ambulatory Visit: Payer: Self-pay | Admitting: Physician Assistant

## 2023-07-16 ENCOUNTER — Encounter (HOSPITAL_COMMUNITY): Payer: Self-pay

## 2023-07-16 DIAGNOSIS — S52571A Other intraarticular fracture of lower end of right radius, initial encounter for closed fracture: Secondary | ICD-10-CM | POA: Diagnosis present

## 2023-07-16 DIAGNOSIS — M25539 Pain in unspecified wrist: Secondary | ICD-10-CM | POA: Diagnosis not present

## 2023-07-16 DIAGNOSIS — S72011A Unspecified intracapsular fracture of right femur, initial encounter for closed fracture: Principal | ICD-10-CM | POA: Diagnosis present

## 2023-07-16 DIAGNOSIS — M25572 Pain in left ankle and joints of left foot: Secondary | ICD-10-CM | POA: Diagnosis not present

## 2023-07-16 DIAGNOSIS — M25551 Pain in right hip: Secondary | ICD-10-CM | POA: Diagnosis not present

## 2023-07-16 DIAGNOSIS — D62 Acute posthemorrhagic anemia: Secondary | ICD-10-CM | POA: Diagnosis not present

## 2023-07-16 DIAGNOSIS — Z833 Family history of diabetes mellitus: Secondary | ICD-10-CM | POA: Diagnosis not present

## 2023-07-16 DIAGNOSIS — E876 Hypokalemia: Secondary | ICD-10-CM | POA: Diagnosis not present

## 2023-07-16 DIAGNOSIS — E785 Hyperlipidemia, unspecified: Secondary | ICD-10-CM | POA: Diagnosis not present

## 2023-07-16 DIAGNOSIS — M80051A Age-related osteoporosis with current pathological fracture, right femur, initial encounter for fracture: Secondary | ICD-10-CM | POA: Diagnosis not present

## 2023-07-16 DIAGNOSIS — I951 Orthostatic hypotension: Secondary | ICD-10-CM | POA: Diagnosis present

## 2023-07-16 DIAGNOSIS — F32A Depression, unspecified: Secondary | ICD-10-CM | POA: Diagnosis present

## 2023-07-16 DIAGNOSIS — S72001A Fracture of unspecified part of neck of right femur, initial encounter for closed fracture: Secondary | ICD-10-CM

## 2023-07-16 DIAGNOSIS — S52611A Displaced fracture of right ulna styloid process, initial encounter for closed fracture: Secondary | ICD-10-CM | POA: Diagnosis present

## 2023-07-16 DIAGNOSIS — Z8262 Family history of osteoporosis: Secondary | ICD-10-CM | POA: Diagnosis not present

## 2023-07-16 DIAGNOSIS — S52501A Unspecified fracture of the lower end of right radius, initial encounter for closed fracture: Secondary | ICD-10-CM | POA: Insufficient documentation

## 2023-07-16 DIAGNOSIS — Z79899 Other long term (current) drug therapy: Secondary | ICD-10-CM

## 2023-07-16 DIAGNOSIS — Z8349 Family history of other endocrine, nutritional and metabolic diseases: Secondary | ICD-10-CM | POA: Diagnosis not present

## 2023-07-16 DIAGNOSIS — I10 Essential (primary) hypertension: Secondary | ICD-10-CM | POA: Diagnosis present

## 2023-07-16 DIAGNOSIS — W010XXA Fall on same level from slipping, tripping and stumbling without subsequent striking against object, initial encounter: Secondary | ICD-10-CM | POA: Diagnosis present

## 2023-07-16 DIAGNOSIS — G8929 Other chronic pain: Secondary | ICD-10-CM | POA: Diagnosis present

## 2023-07-16 DIAGNOSIS — S52351A Displaced comminuted fracture of shaft of radius, right arm, initial encounter for closed fracture: Secondary | ICD-10-CM | POA: Diagnosis not present

## 2023-07-16 DIAGNOSIS — Z808 Family history of malignant neoplasm of other organs or systems: Secondary | ICD-10-CM

## 2023-07-16 DIAGNOSIS — R9431 Abnormal electrocardiogram [ECG] [EKG]: Secondary | ICD-10-CM | POA: Diagnosis not present

## 2023-07-16 DIAGNOSIS — G8918 Other acute postprocedural pain: Secondary | ICD-10-CM | POA: Diagnosis not present

## 2023-07-16 DIAGNOSIS — Y92008 Other place in unspecified non-institutional (private) residence as the place of occurrence of the external cause: Secondary | ICD-10-CM | POA: Diagnosis not present

## 2023-07-16 DIAGNOSIS — Z885 Allergy status to narcotic agent status: Secondary | ICD-10-CM

## 2023-07-16 DIAGNOSIS — S52601A Unspecified fracture of lower end of right ulna, initial encounter for closed fracture: Secondary | ICD-10-CM | POA: Insufficient documentation

## 2023-07-16 DIAGNOSIS — W19XXXA Unspecified fall, initial encounter: Secondary | ICD-10-CM | POA: Diagnosis not present

## 2023-07-16 HISTORY — PX: OPEN REDUCTION INTERNAL FIXATION (ORIF) DISTAL RADIAL FRACTURE: SHX5989

## 2023-07-16 HISTORY — PX: TOTAL HIP ARTHROPLASTY: SHX124

## 2023-07-16 LAB — BASIC METABOLIC PANEL
Anion gap: 11 (ref 5–15)
BUN: 13 mg/dL (ref 8–23)
CO2: 24 mmol/L (ref 22–32)
Calcium: 9.2 mg/dL (ref 8.9–10.3)
Chloride: 101 mmol/L (ref 98–111)
Creatinine, Ser: 0.84 mg/dL (ref 0.44–1.00)
GFR, Estimated: >60 mL/min (ref >60)
Glucose, Bld: 125 mg/dL — ABNORMAL HIGH (ref 70–99)
Potassium: 3.3 mmol/L — ABNORMAL LOW (ref 3.5–5.1)
Sodium: 136 mmol/L (ref 135–145)

## 2023-07-16 LAB — SURGICAL PCR SCREEN
MRSA, PCR: NEGATIVE
Staphylococcus aureus: NEGATIVE

## 2023-07-16 LAB — CBC
HCT: 39.2 % (ref 36.0–46.0)
Hemoglobin: 13.2 g/dL (ref 12.0–15.0)
MCH: 31.7 pg (ref 26.0–34.0)
MCHC: 33.7 g/dL (ref 30.0–36.0)
MCV: 94 fL (ref 80.0–100.0)
Platelets: 237 10*3/uL (ref 150–400)
RBC: 4.17 MIL/uL (ref 3.87–5.11)
RDW: 12.4 % (ref 11.5–15.5)
WBC: 11 10*3/uL — ABNORMAL HIGH (ref 4.0–10.5)
nRBC: 0 % (ref 0.0–0.2)

## 2023-07-16 LAB — TYPE AND SCREEN
ABO/RH(D): A POS
Antibody Screen: NEGATIVE

## 2023-07-16 LAB — ABO/RH: ABO/RH(D): A POS

## 2023-07-16 SURGERY — ARTHROPLASTY, HIP, TOTAL, ANTERIOR APPROACH
Anesthesia: General | Site: Hip | Laterality: Right

## 2023-07-16 MED ORDER — MORPHINE SULFATE (PF) 2 MG/ML IV SOLN: 0.5000 mg | INTRAVENOUS | Status: DC | PRN

## 2023-07-16 MED ORDER — ONDANSETRON HCL 4 MG/2ML IJ SOLN: 4.0000 mg | Freq: Once | INTRAMUSCULAR | Status: DC | PRN

## 2023-07-16 MED ORDER — ASPIRIN 81 MG PO TBEC: 81.0000 mg | DELAYED_RELEASE_TABLET | Freq: Two times a day (BID) | ORAL | 0 refills | Status: DC

## 2023-07-16 MED ORDER — ROCURONIUM BROMIDE 10 MG/ML (PF) SYRINGE
PREFILLED_SYRINGE | INTRAVENOUS | Status: DC | PRN
  Administered 2023-07-16: 30 mg via INTRAVENOUS
  Administered 2023-07-16: 50 mg via INTRAVENOUS
  Administered 2023-07-16: 10 mg via INTRAVENOUS

## 2023-07-16 MED ORDER — PROPOFOL 10 MG/ML IV BOLUS
INTRAVENOUS | Status: DC | PRN
  Administered 2023-07-16: 50 mg via INTRAVENOUS

## 2023-07-16 MED ORDER — ONDANSETRON HCL 4 MG PO TABS: 4.0000 mg | ORAL_TABLET | Freq: Four times a day (QID) | ORAL | Status: DC | PRN

## 2023-07-16 MED ORDER — PHENYLEPHRINE 80 MCG/ML (10ML) SYRINGE FOR IV PUSH (FOR BLOOD PRESSURE SUPPORT)
PREFILLED_SYRINGE | INTRAVENOUS | Status: DC | PRN
  Administered 2023-07-16 (×2): 80 ug via INTRAVENOUS

## 2023-07-16 MED ORDER — ALBUMIN HUMAN 5 % IV SOLN
INTRAVENOUS | Status: DC | PRN
Start: 2023-07-16 — End: 2023-07-16

## 2023-07-16 MED ORDER — TRANEXAMIC ACID-NACL 1000-0.7 MG/100ML-% IV SOLN
1000.0000 mg | Freq: Once | INTRAVENOUS | Status: AC
  Administered 2023-07-16: 1000 mg via INTRAVENOUS
  Filled 2023-07-16: qty 100

## 2023-07-16 MED ORDER — BUPIVACAINE-MELOXICAM ER 400-12 MG/14ML IJ SOLN
INTRAMUSCULAR | Status: AC
  Filled 2023-07-16: qty 1

## 2023-07-16 MED ORDER — TRANEXAMIC ACID 1000 MG/10ML IV SOLN
2000.0000 mg | INTRAVENOUS | Status: DC
  Filled 2023-07-16: qty 20

## 2023-07-16 MED ORDER — FENTANYL CITRATE (PF) 100 MCG/2ML IJ SOLN
25.0000 ug | INTRAMUSCULAR | Status: DC | PRN
  Administered 2023-07-16: 50 ug via INTRAVENOUS

## 2023-07-16 MED ORDER — POVIDONE-IODINE 10 % EX SWAB
2.0000 | Freq: Once | CUTANEOUS | Status: AC
  Administered 2023-07-16: 2 via TOPICAL

## 2023-07-16 MED ORDER — ONDANSETRON HCL 4 MG/2ML IJ SOLN: 4.0000 mg | Freq: Four times a day (QID) | INTRAMUSCULAR | Status: DC | PRN

## 2023-07-16 MED ORDER — PRONTOSAN WOUND IRRIGATION OPTIME
TOPICAL | Status: DC | PRN
  Administered 2023-07-16: 1 via TOPICAL

## 2023-07-16 MED ORDER — ACETAMINOPHEN 650 MG RE SUPP: 650.0000 mg | Freq: Four times a day (QID) | RECTAL | Status: DC | PRN

## 2023-07-16 MED ORDER — CEFAZOLIN SODIUM-DEXTROSE 2-4 GM/100ML-% IV SOLN
2.0000 g | INTRAVENOUS | Status: AC
  Administered 2023-07-16: 2 g via INTRAVENOUS
  Filled 2023-07-16: qty 100

## 2023-07-16 MED ORDER — BUPIVACAINE-MELOXICAM ER 400-12 MG/14ML IJ SOLN
INTRAMUSCULAR | Status: DC | PRN
  Administered 2023-07-16: 400 mg

## 2023-07-16 MED ORDER — ONDANSETRON HCL 4 MG/2ML IJ SOLN
INTRAMUSCULAR | Status: DC | PRN
  Administered 2023-07-16: 4 mg via INTRAVENOUS

## 2023-07-16 MED ORDER — FENTANYL CITRATE PF 50 MCG/ML IJ SOSY
50.0000 ug | PREFILLED_SYRINGE | Freq: Once | INTRAMUSCULAR | Status: AC
  Administered 2023-07-16: 50 ug via INTRAVENOUS
  Filled 2023-07-16: qty 1

## 2023-07-16 MED ORDER — TRAZODONE HCL 50 MG PO TABS: 25.0000 mg | ORAL_TABLET | Freq: Every evening | ORAL | Status: DC | PRN

## 2023-07-16 MED ORDER — ENOXAPARIN SODIUM 40 MG/0.4ML IJ SOSY: 40.0000 mg | PREFILLED_SYRINGE | INTRAMUSCULAR | Status: DC

## 2023-07-16 MED ORDER — FENTANYL CITRATE (PF) 250 MCG/5ML IJ SOLN
INTRAMUSCULAR | Status: AC
  Filled 2023-07-16: qty 5

## 2023-07-16 MED ORDER — POLYETHYLENE GLYCOL 3350 17 G PO PACK: 17.0000 g | PACK | Freq: Every day | ORAL | Status: DC | PRN

## 2023-07-16 MED ORDER — HYDROCODONE-ACETAMINOPHEN 5-325 MG PO TABS
1.0000 | ORAL_TABLET | ORAL | Status: DC | PRN
  Filled 2023-07-16: qty 1

## 2023-07-16 MED ORDER — MAGNESIUM CITRATE PO SOLN: 1.0000 | Freq: Once | ORAL | Status: DC | PRN

## 2023-07-16 MED ORDER — CHLORHEXIDINE GLUCONATE 0.12 % MT SOLN
15.0000 mL | Freq: Once | OROMUCOSAL | Status: AC
  Administered 2023-07-16: 15 mL via OROMUCOSAL
  Filled 2023-07-16: qty 15

## 2023-07-16 MED ORDER — PHENYLEPHRINE 80 MCG/ML (10ML) SYRINGE FOR IV PUSH (FOR BLOOD PRESSURE SUPPORT)
PREFILLED_SYRINGE | INTRAVENOUS | Status: AC
  Filled 2023-07-16: qty 10

## 2023-07-16 MED ORDER — TRANEXAMIC ACID 1000 MG/10ML IV SOLN
INTRAVENOUS | Status: DC | PRN
  Administered 2023-07-16: 2000 mg via TOPICAL

## 2023-07-16 MED ORDER — VANCOMYCIN HCL 1 G IV SOLR
INTRAVENOUS | Status: DC | PRN
  Administered 2023-07-16: 1000 mg

## 2023-07-16 MED ORDER — MIDAZOLAM HCL 2 MG/2ML IJ SOLN
INTRAMUSCULAR | Status: DC | PRN
  Administered 2023-07-16 (×2): 1 mg via INTRAVENOUS

## 2023-07-16 MED ORDER — DOCUSATE SODIUM 100 MG PO CAPS
100.0000 mg | ORAL_CAPSULE | Freq: Two times a day (BID) | ORAL | Status: DC
  Administered 2023-07-16 – 2023-07-18 (×4): 100 mg via ORAL
  Filled 2023-07-16 (×4): qty 1

## 2023-07-16 MED ORDER — ACETAMINOPHEN 10 MG/ML IV SOLN
INTRAVENOUS | Status: DC | PRN
Start: 2023-07-16 — End: 2023-07-16
  Administered 2023-07-16: 1000 mg via INTRAVENOUS

## 2023-07-16 MED ORDER — MENTHOL 3 MG MT LOZG: 1.0000 | LOZENGE | OROMUCOSAL | Status: DC | PRN

## 2023-07-16 MED ORDER — SODIUM CHLORIDE 0.9 % IV SOLN: INTRAVENOUS | Status: DC

## 2023-07-16 MED ORDER — FENTANYL CITRATE PF 50 MCG/ML IJ SOSY: 12.5000 ug | PREFILLED_SYRINGE | INTRAMUSCULAR | Status: DC | PRN

## 2023-07-16 MED ORDER — HYDROCODONE-ACETAMINOPHEN 7.5-325 MG PO TABS: 1.0000 | ORAL_TABLET | Freq: Four times a day (QID) | ORAL | 0 refills | Status: DC | PRN

## 2023-07-16 MED ORDER — ONDANSETRON HCL 4 MG/2ML IJ SOLN
INTRAMUSCULAR | Status: AC
  Filled 2023-07-16: qty 2

## 2023-07-16 MED ORDER — MIDAZOLAM HCL 2 MG/2ML IJ SOLN
INTRAMUSCULAR | Status: AC
  Filled 2023-07-16: qty 2

## 2023-07-16 MED ORDER — ROCURONIUM BROMIDE 10 MG/ML (PF) SYRINGE
PREFILLED_SYRINGE | INTRAVENOUS | Status: AC
  Filled 2023-07-16: qty 10

## 2023-07-16 MED ORDER — HYDRALAZINE HCL 20 MG/ML IJ SOLN: 5.0000 mg | Freq: Four times a day (QID) | INTRAMUSCULAR | Status: DC | PRN

## 2023-07-16 MED ORDER — LISINOPRIL 10 MG PO TABS
10.0000 mg | ORAL_TABLET | Freq: Every day | ORAL | Status: DC
  Administered 2023-07-17 – 2023-07-18 (×2): 10 mg via ORAL
  Filled 2023-07-16 (×2): qty 1

## 2023-07-16 MED ORDER — HYDROMORPHONE HCL 1 MG/ML IJ SOLN
0.5000 mg | Freq: Once | INTRAMUSCULAR | Status: AC
  Administered 2023-07-16: 0.5 mg via INTRAVENOUS
  Filled 2023-07-16: qty 1

## 2023-07-16 MED ORDER — ACETAMINOPHEN 10 MG/ML IV SOLN
INTRAVENOUS | Status: AC
  Filled 2023-07-16: qty 100

## 2023-07-16 MED ORDER — CEFAZOLIN SODIUM-DEXTROSE 2-4 GM/100ML-% IV SOLN
2.0000 g | Freq: Four times a day (QID) | INTRAVENOUS | Status: AC
  Administered 2023-07-16 – 2023-07-17 (×3): 2 g via INTRAVENOUS
  Filled 2023-07-16 (×3): qty 100

## 2023-07-16 MED ORDER — DEXAMETHASONE SODIUM PHOSPHATE 10 MG/ML IJ SOLN
INTRAMUSCULAR | Status: AC
  Filled 2023-07-16: qty 1

## 2023-07-16 MED ORDER — HYDROCODONE-ACETAMINOPHEN 7.5-325 MG PO TABS: 1.0000 | ORAL_TABLET | ORAL | Status: DC | PRN

## 2023-07-16 MED ORDER — METHOCARBAMOL 1000 MG/10ML IJ SOLN: 500.0000 mg | Freq: Four times a day (QID) | INTRAVENOUS | Status: DC | PRN

## 2023-07-16 MED ORDER — ORAL CARE MOUTH RINSE: 15.0000 mL | Freq: Once | OROMUCOSAL | Status: AC

## 2023-07-16 MED ORDER — FENTANYL CITRATE (PF) 100 MCG/2ML IJ SOLN
INTRAMUSCULAR | Status: AC
  Filled 2023-07-16: qty 2

## 2023-07-16 MED ORDER — DEXAMETHASONE SODIUM PHOSPHATE 10 MG/ML IJ SOLN
INTRAMUSCULAR | Status: DC | PRN
  Administered 2023-07-16: 10 mg via INTRAVENOUS

## 2023-07-16 MED ORDER — VANCOMYCIN HCL 1000 MG IV SOLR
INTRAVENOUS | Status: AC
  Filled 2023-07-16: qty 20

## 2023-07-16 MED ORDER — ENOXAPARIN SODIUM 40 MG/0.4ML IJ SOSY
40.0000 mg | PREFILLED_SYRINGE | INTRAMUSCULAR | Status: DC
  Administered 2023-07-17 – 2023-07-18 (×2): 40 mg via SUBCUTANEOUS
  Filled 2023-07-16 (×2): qty 0.4

## 2023-07-16 MED ORDER — LACTATED RINGERS IV SOLN: INTRAVENOUS | Status: DC

## 2023-07-16 MED ORDER — TRANEXAMIC ACID-NACL 1000-0.7 MG/100ML-% IV SOLN
1000.0000 mg | INTRAVENOUS | Status: AC
  Administered 2023-07-16: 1000 mg via INTRAVENOUS
  Filled 2023-07-16: qty 100

## 2023-07-16 MED ORDER — FENTANYL CITRATE (PF) 250 MCG/5ML IJ SOLN
INTRAMUSCULAR | Status: DC | PRN
  Administered 2023-07-16 (×5): 50 ug via INTRAVENOUS

## 2023-07-16 MED ORDER — ACETAMINOPHEN 10 MG/ML IV SOLN: 1000.0000 mg | Freq: Once | INTRAVENOUS | Status: DC | PRN

## 2023-07-16 MED ORDER — PHENOL 1.4 % MT LIQD: 1.0000 | OROMUCOSAL | Status: DC | PRN

## 2023-07-16 MED ORDER — ACETAMINOPHEN 325 MG PO TABS: 325.0000 mg | ORAL_TABLET | Freq: Four times a day (QID) | ORAL | Status: DC | PRN

## 2023-07-16 MED ORDER — SORBITOL 70 % SOLN: 30.0000 mL | Freq: Every day | Status: DC | PRN

## 2023-07-16 MED ORDER — 0.9 % SODIUM CHLORIDE (POUR BTL) OPTIME
TOPICAL | Status: DC | PRN
  Administered 2023-07-16 (×2): 1000 mL

## 2023-07-16 MED ORDER — METHOCARBAMOL 500 MG PO TABS
500.0000 mg | ORAL_TABLET | Freq: Four times a day (QID) | ORAL | Status: DC | PRN
  Filled 2023-07-16: qty 1

## 2023-07-16 MED ORDER — SUGAMMADEX SODIUM 200 MG/2ML IV SOLN
INTRAVENOUS | Status: DC | PRN
  Administered 2023-07-16: 120 mg via INTRAVENOUS

## 2023-07-16 MED ORDER — ACETAMINOPHEN 325 MG PO TABS
650.0000 mg | ORAL_TABLET | Freq: Four times a day (QID) | ORAL | Status: DC | PRN
  Administered 2023-07-18: 650 mg via ORAL
  Filled 2023-07-16 (×2): qty 2

## 2023-07-16 MED ORDER — PHENYLEPHRINE HCL-NACL 20-0.9 MG/250ML-% IV SOLN
INTRAVENOUS | Status: DC | PRN
  Administered 2023-07-16: 20 ug/min via INTRAVENOUS

## 2023-07-16 MED ORDER — HYDROCODONE-ACETAMINOPHEN 5-325 MG PO TABS: 1.0000 | ORAL_TABLET | ORAL | Status: DC | PRN

## 2023-07-16 MED ORDER — ATORVASTATIN CALCIUM 10 MG PO TABS
10.0000 mg | ORAL_TABLET | Freq: Every day | ORAL | Status: DC
  Administered 2023-07-17 – 2023-07-18 (×2): 10 mg via ORAL
  Filled 2023-07-16 (×2): qty 1

## 2023-07-16 MED ORDER — ALUM & MAG HYDROXIDE-SIMETH 200-200-20 MG/5ML PO SUSP: 30.0000 mL | ORAL | Status: DC | PRN

## 2023-07-16 MED ORDER — ACETAMINOPHEN 500 MG PO TABS
500.0000 mg | ORAL_TABLET | Freq: Four times a day (QID) | ORAL | Status: AC
  Administered 2023-07-17 (×3): 500 mg via ORAL
  Filled 2023-07-16 (×4): qty 1

## 2023-07-16 SURGICAL SUPPLY — 118 items
ADH SKN CLS APL DERMABOND .7 (GAUZE/BANDAGES/DRESSINGS) ×2
BAG COUNTER SPONGE SURGICOUNT (BAG) ×2 IMPLANT
BAG DECANTER FOR FLEXI CONT (MISCELLANEOUS) ×2 IMPLANT
BAG SPNG CNTER NS LX DISP (BAG) ×2
BIT DRILL 2.2 SS TIBIAL (BIT) IMPLANT
BLADE CLIPPER SURG (BLADE) IMPLANT
BLADE SAG 18X100X1.27 (BLADE) ×2 IMPLANT
BLADE SURG 10 STRL SS (BLADE) ×2 IMPLANT
BLADE SURG 15 STRL LF DISP TIS (BLADE) ×2 IMPLANT
BLADE SURG 15 STRL SS (BLADE) ×2
BNDG CMPR 5X3 KNIT ELC UNQ LF (GAUZE/BANDAGES/DRESSINGS) ×4
BNDG CMPR 9X4 STRL LF SNTH (GAUZE/BANDAGES/DRESSINGS) ×2
BNDG ELASTIC 3INX 5YD STR LF (GAUZE/BANDAGES/DRESSINGS) ×2 IMPLANT
BNDG ELASTIC 4X5.8 VLCR STR LF (GAUZE/BANDAGES/DRESSINGS) ×2 IMPLANT
BNDG ESMARK 4X9 LF (GAUZE/BANDAGES/DRESSINGS) ×2 IMPLANT
CORD BIPOLAR FORCEPS 12FT (ELECTRODE) ×2 IMPLANT
COVER PERINEAL POST (MISCELLANEOUS) ×2 IMPLANT
COVER SURGICAL LIGHT HANDLE (MISCELLANEOUS) ×2 IMPLANT
CUFF TOURN SGL QUICK 18X4 (TOURNIQUET CUFF) ×2 IMPLANT
CUFF TOURN SGL QUICK 24 (TOURNIQUET CUFF)
CUFF TRNQT CYL 24X4X16.5-23 (TOURNIQUET CUFF) IMPLANT
CUP SECTOR GRIPTON 50MM (Cup) IMPLANT
DERMABOND ADVANCED .7 DNX12 (GAUZE/BANDAGES/DRESSINGS) IMPLANT
DRAPE C-ARM 42X120 X-RAY (DRAPES) IMPLANT
DRAPE C-ARM 42X72 X-RAY (DRAPES) ×2 IMPLANT
DRAPE EXTREMITY T 121X128X90 (DISPOSABLE) IMPLANT
DRAPE POUCH INSTRU U-SHP 10X18 (DRAPES) ×2 IMPLANT
DRAPE STERI IOBAN 125X83 (DRAPES) ×2 IMPLANT
DRAPE U-SHAPE 47X51 STRL (DRAPES) ×4 IMPLANT
DRSG AQUACEL AG ADV 3.5X10 (GAUZE/BANDAGES/DRESSINGS) ×2 IMPLANT
DURAPREP 26ML APPLICATOR (WOUND CARE) ×4 IMPLANT
ELECT BLADE 4.0 EZ CLEAN MEGAD (MISCELLANEOUS) ×2
ELECT CAUTERY BLADE 6.4 (BLADE) ×2 IMPLANT
ELECT REM PT RETURN 9FT ADLT (ELECTROSURGICAL) ×2
ELECTRODE BLDE 4.0 EZ CLN MEGD (MISCELLANEOUS) ×2 IMPLANT
ELECTRODE REM PT RTRN 9FT ADLT (ELECTROSURGICAL) ×2 IMPLANT
GAUZE SPONGE 4X4 12PLY STRL (GAUZE/BANDAGES/DRESSINGS) ×2 IMPLANT
GAUZE XEROFORM 1X8 LF (GAUZE/BANDAGES/DRESSINGS) ×2 IMPLANT
GLOVE BIOGEL PI IND STRL 7.0 (GLOVE) ×4 IMPLANT
GLOVE BIOGEL PI IND STRL 7.5 (GLOVE) ×10 IMPLANT
GLOVE ECLIPSE 7.0 STRL STRAW (GLOVE) ×4 IMPLANT
GLOVE SKINSENSE STRL SZ7.5 (GLOVE) ×2 IMPLANT
GLOVE SURG SYN 7.5 E (GLOVE) ×36 IMPLANT
GLOVE SURG SYN 7.5 PF PI (GLOVE) ×4 IMPLANT
GLOVE SURG UNDER POLY LF SZ7 (GLOVE) ×38 IMPLANT
GLOVE SURG UNDER POLY LF SZ7.5 (GLOVE) ×4 IMPLANT
GOWN STRL REUS W/ TWL LRG LVL3 (GOWN DISPOSABLE) IMPLANT
GOWN STRL REUS W/ TWL XL LVL3 (GOWN DISPOSABLE) ×2 IMPLANT
GOWN STRL REUS W/TWL LRG LVL3 (GOWN DISPOSABLE)
GOWN STRL REUS W/TWL XL LVL3 (GOWN DISPOSABLE) ×2
GOWN STRL SURGICAL XL XLNG (GOWN DISPOSABLE) ×2 IMPLANT
GOWN TOGA ZIPPER T7+ PEEL AWAY (MISCELLANEOUS) ×4 IMPLANT
HANDPIECE INTERPULSE COAX TIP (DISPOSABLE) ×2
HEAD FEMORAL 32 CERAMIC (Hips) IMPLANT
HOOD PEEL AWAY T7 (MISCELLANEOUS) ×2 IMPLANT
IV NS IRRIG 3000ML ARTHROMATIC (IV SOLUTION) ×2 IMPLANT
K-WIRE 1.6 (WIRE) ×4
K-WIRE FX5X1.6XNS BN SS (WIRE) ×4
KIT BASIN OR (CUSTOM PROCEDURE TRAY) ×2 IMPLANT
KIT TURNOVER KIT B (KITS) ×2 IMPLANT
KWIRE FX5X1.6XNS BN SS (WIRE) IMPLANT
LINER ACET PNNCL PLUS4 NEUTRAL (Hips) IMPLANT
MARKER SKIN DUAL TIP RULER LAB (MISCELLANEOUS) ×2 IMPLANT
NDL 22X1.5 STRL (OR ONLY) (MISCELLANEOUS) IMPLANT
NDL SPNL 18GX3.5 QUINCKE PK (NEEDLE) ×2 IMPLANT
NEEDLE 22X1.5 STRL (OR ONLY) (MISCELLANEOUS) IMPLANT
NEEDLE SPNL 18GX3.5 QUINCKE PK (NEEDLE) ×2 IMPLANT
NS IRRIG 1000ML POUR BTL (IV SOLUTION) ×2 IMPLANT
PACK ORTHO EXTREMITY (CUSTOM PROCEDURE TRAY) ×2 IMPLANT
PACK TOTAL JOINT (CUSTOM PROCEDURE TRAY) ×2 IMPLANT
PACK UNIVERSAL I (CUSTOM PROCEDURE TRAY) ×2 IMPLANT
PAD ARMBOARD 7.5X6 YLW CONV (MISCELLANEOUS) ×4 IMPLANT
PAD CAST 4YDX4 CTTN HI CHSV (CAST SUPPLIES) ×2 IMPLANT
PADDING CAST COTTON 4X4 STRL (CAST SUPPLIES) ×2
PINNACLE PLUS 4 NEUTRAL (Hips) ×2 IMPLANT
PLATE NARROW DVR RIGHT (Plate) IMPLANT
PUTTY DBM STAGRAFT PLUS 2CC (Putty) IMPLANT
SCREW 6.5MMX30MM (Screw) IMPLANT
SCREW LOCK 12X2.7X 3 LD (Screw) IMPLANT
SCREW LOCK 14X2.7X 3 LD TPR (Screw) IMPLANT
SCREW LOCK 20X2.7X 3 LD TPR (Screw) IMPLANT
SCREW LOCK 22X2.7X 3 LD TPR (Screw) IMPLANT
SCREW LOCKING 2.7X12MM (Screw) ×4 IMPLANT
SCREW LOCKING 2.7X14 (Screw) ×2 IMPLANT
SCREW LOCKING 2.7X20MM (Screw) ×6 IMPLANT
SCREW LOCKING 2.7X22MM (Screw) ×2 IMPLANT
SCREW NLOCK 24X2.7 3 LD (Screw) IMPLANT
SCREW NONLOCK 2.7X20MM (Screw) IMPLANT
SCREW NONLOCK 2.7X24 (Screw) ×2 IMPLANT
SET HNDPC FAN SPRY TIP SCT (DISPOSABLE) ×2 IMPLANT
SLING ARM FOAM STRAP MED (SOFTGOODS) IMPLANT
SOLUTION PRONTOSAN WOUND 350ML (IRRIGATION / IRRIGATOR) ×2 IMPLANT
SPLINT FIBERGLASS 3X35 (CAST SUPPLIES) IMPLANT
SPONGE T-LAP 4X18 ~~LOC~~+RFID (SPONGE) IMPLANT
STAPLER VISISTAT 35W (STAPLE) IMPLANT
STEM FEM ACTIS STD SZ4 (Stem) IMPLANT
SUT ETHIBOND 2 V 37 (SUTURE) ×2 IMPLANT
SUT ETHILON 4 0 PS 2 18 (SUTURE) ×2 IMPLANT
SUT MNCRL AB 4-0 PS2 18 (SUTURE) IMPLANT
SUT PROLENE 3 0 PS 2 (SUTURE) IMPLANT
SUT VIC AB 0 CT1 27 (SUTURE) ×2
SUT VIC AB 0 CT1 27XBRD ANBCTR (SUTURE) ×2 IMPLANT
SUT VIC AB 1 CTX 36 (SUTURE) ×2
SUT VIC AB 1 CTX36XBRD ANBCTR (SUTURE) ×2 IMPLANT
SUT VIC AB 2-0 CT1 27 (SUTURE) ×4
SUT VIC AB 2-0 CT1 TAPERPNT 27 (SUTURE) ×4 IMPLANT
SUT VIC AB 3-0 FS2 27 (SUTURE) IMPLANT
SYR 50ML LL SCALE MARK (SYRINGE) ×2 IMPLANT
SYR CONTROL 10ML LL (SYRINGE) IMPLANT
TOWEL GREEN STERILE (TOWEL DISPOSABLE) ×2 IMPLANT
TOWEL GREEN STERILE FF (TOWEL DISPOSABLE) ×2 IMPLANT
TRAY CATH INTERMITTENT SS 16FR (CATHETERS) IMPLANT
TRAY FOLEY W/BAG SLVR 16FR (SET/KITS/TRAYS/PACK)
TRAY FOLEY W/BAG SLVR 16FR ST (SET/KITS/TRAYS/PACK) IMPLANT
TUBE CONNECTING 12X1/4 (SUCTIONS) ×2 IMPLANT
TUBE SUCT ARGYLE STRL (TUBING) ×2 IMPLANT
UNDERPAD 30X36 HEAVY ABSORB (UNDERPADS AND DIAPERS) ×2 IMPLANT
YANKAUER SUCT BULB TIP NO VENT (SUCTIONS) ×2 IMPLANT

## 2023-07-16 NOTE — Transfer of Care (Signed)
Immediate Anesthesia Transfer of Care Note  Patient: [name]  Procedure(s) Performed: TOTAL HIP ARTHROPLASTY ANTERIOR APPROACH (Right: Hip) OPEN REDUCTION INTERNAL FIXATION (ORIF) DISTAL RADIUS FRACTURE (Right: Arm Lower)  Patient Location: PACU  Anesthesia Type:General and Regional  Level of Consciousness: drowsy and patient cooperative  Airway & Oxygen Therapy: Patient Spontanous Breathing  Post-op Assessment: Report given to RN and Post -op Vital signs reviewed and stable  Post vital signs: Reviewed and stable  Last Vitals:  Vitals Value Taken Time  BP 149/81 07/16/23 2119  Temp    Pulse 80 07/16/23 2120  Resp 23 07/16/23 2120  SpO2 96 % 07/16/23 2120  Vitals shown include unfiled device data.  Last Pain:  Vitals:   07/16/23 1346  TempSrc: Oral  PainSc:          Complications: No notable events documented.

## 2023-07-16 NOTE — Progress Notes (Signed)
Orthopedic Tech Progress Note Patient Details:  Cheryl Glass 1951/09/11 [id number]  Ortho Devices Type of Ortho Device: Ace wrap, Sugartong splint, Cotton web roll Ortho Device/Splint Location: right Ortho Device/Splint Interventions: Ordered, Application, Adjustment   Post Interventions Patient Tolerated: Well Instructions Provided: Care of device, Adjustment of device  Kizzie Fantasia 07/16/2023, 11:42 AM

## 2023-07-16 NOTE — Discharge Instructions (Addendum)

## 2023-07-16 NOTE — Anesthesia Procedure Notes (Signed)
Procedure Name: Intubation Date/Time: 07/16/2023 5:34 PM  Performed by: Zollie Beckers, CRNAPre-anesthesia Checklist: Patient identified, Emergency Drugs available, Suction available and Patient being monitored Patient Re-evaluated:Patient Re-evaluated prior to induction Oxygen Delivery Method: Circle system utilized Preoxygenation: Pre-oxygenation with 100% oxygen Induction Type: IV induction Ventilation: Mask ventilation without difficulty Laryngoscope Size: Mac and 3 Grade View: Grade I Tube type: Oral Tube size: 7.0 mm Number of attempts: 1 Airway Equipment and Method: Stylet Placement Confirmation: ETT inserted through vocal cords under direct vision, positive ETCO2 and breath sounds checked- equal and bilateral Secured at: 21 cm Tube secured with: Tape Dental Injury: Teeth and Oropharynx as per pre-operative assessment

## 2023-07-16 NOTE — ED Notes (Signed)
Report given to Gaylyn Rong, RN on 5N. [facility] present.

## 2023-07-16 NOTE — Progress Notes (Signed)
Consult received from ED provider.  I have reviewed the imaging and patient has been n.p.o. and appears to be medically optimized.  I would like to go ahead and transfer her to [facility] for surgical treatment of both the right hip and the wrist this afternoon.  Appreciate admission to the hospitalist service.

## 2023-07-16 NOTE — Op Note (Addendum)
TOTAL HIP ARTHROPLASTY ANTERIOR APPROACH, OPEN REDUCTION INTERNAL FIXATION (ORIF) DISTAL RADIUS FRACTURE  Procedure Note Avamae Dehaan   [id number]  Pre-op Diagnosis:  Right femoral neck fracture Right intraarticular distal radius fracture     Post-op Diagnosis: same  Operative Findings Acute subcapital femoral neck fracture Intra-articular distal radius fracture greater than 3 fragments Osteoporotic bone   Operative Procedures  1.  Total hip replacement right hip; uncemented [id number]  2.  Open reduction internal fixation of right distal radius fracture greater than 3 fragments through separate incision CPT 25609  Surgeon: Gershon Mussel, M.D.  Assist: Oneal Grout, PA-C   Anesthesia: General  Prosthesis: Depuy Acetabulum: Pinnacle 50 mm Femur: Actis 4 STD Head: 32 mm size: +1 Liner: +4 Bearing Type: ceramic/poly  Implants: Biomet DVR plate  Patient was brought back to the operating room and placed supine on the Hana table.  General anesthesia was administered.  The right upper extremity was placed on her arm board and a nonsterile tourniquet was placed on the upper arm.  The operative extremity was prepped and draped in standard sterile fashion.  Timeout was performed.  An Esmarch was used to exsanguinate the extremity and the tourniquet was inflated to 250 mmHg.  An incision was made over the volar wrist and a standard FCR approach was utilized.  The pronator quadratus was elevated off of the distal radius.  The fracture was encountered.  There was comminution to the fracture and greater than 3 fragments with the 2 major fragments being the radial styloid and the lunate facet fragment.  There was metaphyseal bone loss due to the osteoporotic nature of the bone.  The fracture was brought out to alignment and the radial styloid piece was pinned with a K wire back to the radial shaft.  Brachioradialis was released to facilitate reduction.  I used bone graft to pack the area  of metaphyseal bone loss in order to provide a foundation for the lunate facet and the radial styloid fragments.  With the fragments reduced I applied a volar locking plate which was checked under fluoroscopy for position.  Nonlocking screws were placed through the proximal portion of the plate to secure it to the shaft.  Locking screws were placed through the distal rows of the plate to capture the radial styloid and lunate facet fragments.  I was not able to anatomically reduce the articular surface due to the complexity of the fracture.  I did restore the radial height, inclination and volar tilt.  The surgical site was then thoroughly irrigated and closed in a layered fashion using 3-0 Vicryl and 4-0 nylon.  Sterile dressings were applied.  She was placed in a sugar-tong splint.  She tolerated procedure well.  We then turned our attention to the hip fracture.  The operative extremity was prepped and draped in normal sterile fashion. Surgical timeout occurred verifying patient identification, surgical site, surgical procedure and administration of antibiotics.  A 10 cm longitudinal incision was made starting from 2 fingerbreadths lateral and inferior to the ASIS towards the lateral aspect of the patella.  A Hueter approach to the hip was performed, using the interval between tensor fascia lata and sartorius.  Dissection was carried bluntly down onto the anterior hip capsule. The lateral femoral circumflex vessels were identified and coagulated. A capsulotomy was performed and the capsular flaps tagged for later repair.  The neck osteotomy was performed below the level of the fracture approximately a fingerbreadth above the lesser trochanter. The femoral head  was removed which showed osteoporotic bone but no signs of degenerative joint disease, the acetabular rim was cleared of soft tissue and osteophytes and attention was turned to reaming the acetabulum.  Sequential reaming was performed under fluoroscopic  guidance down to the floor of the cotyloid fossa. We reamed to a size 49 mm, and then impacted the acetabular shell. A 30 mm cancellous screw was placed to secure the shell.  The liner was then placed after irrigation and attention turned to the femur.  After placing the femoral hook, the leg was taken to externally rotated, extended and adducted position taking care to perform soft tissue releases to allow for adequate mobilization of the femur. Soft tissue was cleared from the shoulder of the greater trochanter and the hook elevator used to improve exposure of the proximal femur. Sequential broaching performed up to a size 4. Trial neck and head were placed. The leg was brought back up to neutral and the construct reduced.  Antibiotic irrigation was placed in the surgical wound.  The position and sizing of components, offset and leg lengths were checked using fluoroscopy. Stability of the construct was checked in extension and external rotation without any subluxation, shuck or impingement of prosthesis. We dislocated the prosthesis, dropped the leg back into position, removed trial components, and irrigated copiously. The final stem and head was then placed, the leg brought back up, the system reduced and fluoroscopy used to verify positioning.  We irrigated, obtained hemostasis and closed the capsule using #2 ethibond suture.  One gram of vancomycin powder was placed in the surgical bed.   One gram of topical tranexamic acid was injected into the joint.  The fascia was closed with #1 vicryl plus, the deep fat layer was closed with 0 vicryl, the subcutaneous layers closed with 2.0 Vicryl Plus and the skin closed with 2-0 nylon and Dermabond. A sterile dressing was applied. The patient was awakened in the operating room and taken to recovery in stable condition.  All sponge, needle, and instrument counts were correct at the end of the case.   Tessa Lerner, my PA, was a medical necessity for opening,  closing, limb positioning, retracting, exposing, and overall facilitation and timely completion of the surgery.  Position: supine  Complications: see description of procedure.  Time Out: performed   Drains/Packing: none  Estimated blood loss: see anesthesia record  Returned to Recovery Room: in good condition.   Antibiotics: yes   Mechanical VTE (DVT) Prophylaxis: sequential compression devices, TED thigh-high  Chemical VTE (DVT) Prophylaxis: Lovenox postop day 1  Fluid Replacement: see anesthesia record  Specimens Removed: 1 to pathology   Sponge and Instrument Count Correct? yes   PACU: portable radiograph - low AP   Plan/RTC: Return in 2 weeks for staple removal. Weight Bearing/Load Lower Extremity: Weightbearing as tolerated right lower extremity, platform weightbearing right upper extremity Hip precautions: none Suture Removal: 2 weeks   N. Glee Arvin, MD Aldean Baker 8:48 PM   Implant Name Type Inv. Item Serial No. Manufacturer Lot No. LRB No. Used Action  LOG [id number] -  BIOMET DVR HAND INNOVATIONS SET - 1          LOG [id number] -  BIOMET/DEPUY ACE LOCKING SMALL FRAGMENT SET - 1          PLATE NARROW DVR RIGHT - [id number] Plate PLATE NARROW DVR RIGHT  ZIMMER [employer]ON(ORTH,TRAU,BIO,SG)  Right 1 Implanted  SCREW LOCKING 2.7X12MM - [id number] Screw SCREW LOCKING 2.7X12MM  ZIMMER [employer]ON(ORTH,TRAU,BIO,SG)  Right  1 Implanted  SCREW LOCKING 2.7X20MM - [id number] Screw SCREW LOCKING 2.7X20MM  ZIMMER RECON(ORTH,TRAU,BIO,SG)  Right 3 Implanted  SCREW LOCKING 2.7X22MM - [id number] Screw SCREW LOCKING 2.7X22MM  ZIMMER RECON(ORTH,TRAU,BIO,SG)  Right 1 Implanted  SCREW NONLOCK 2.7X20MM - [id number] Screw SCREW NONLOCK 2.7X20MM  ZIMMER RECON(ORTH,TRAU,BIO,SG)  Right 1 Implanted  SCREW NONLOCK 2.7X24 - [id number] Screw SCREW NONLOCK 2.7X24  ZIMMER RECON(ORTH,TRAU,BIO,SG)  Right 1 Implanted  SCREW LOCKING 2.7X14 - [id number] Screw SCREW LOCKING 2.7X14  ZIMMER  RECON(ORTH,TRAU,BIO,SG)  Right 1 Implanted  PUTTY DBM STAGRAFT PLUS 2CC - [id number] Putty PUTTY DBM STAGRAFT PLUS 2CC  ZIMMER RECON(ORTH,TRAU,BIO,SG) [id number] Right 1 Implanted  CUP SECTOR GRIPTON - [id number] Cup CUP SECTOR GRIPTON  DEPUY ORTHOPAEDICS [id number] Right 1 Implanted  SCREW 6.5MMX30MM - [id number] Screw SCREW 6.5MMX30MM  DEPUY ORTHOPAEDICS [id number] Right 1 Implanted  PINNACLE PLUS 4 NEUTRAL - [id number] Hips PINNACLE PLUS 4 NEUTRAL  DEPUY ORTHOPAEDICS [id number] Right 1 Implanted  STEM FEM ACTIS STD SZ4 - [id number] Stem STEM FEM ACTIS STD SZ4  DEPUY ORTHOPAEDICS [id number] Right 1 Implanted  HEAD FEMORAL 32 CERAMIC - [id number] Hips HEAD FEMORAL 32 CERAMIC  DEPUY ORTHOPAEDICS [id number] Right 1 Implanted

## 2023-07-16 NOTE — H&P (Addendum)
History and Physical  Cheryl Glass ZOX:[id number] DOB: 10/05/1951 DOA: 07/16/2023  PCP: Sigmund Hazel, MD   Chief Complaint: Mechanical fall, right hip and wrist fracture  HPI: Cheryl Glass is a 72 y.o. female with medical history significant for osteoporosis, hypertension, hyperlipidemia being admitted to the hospital with mechanical fall resulting in right wrist and right femoral neck fracture.  Patient was talking to her neighbor outside of her house, when she accidentally stepped off the pavement and onto dirt at the side of the road.  Had immediate right hip and right upper extremity pain was unable to get up off the ground on her own.  Denies hitting her head, or losing consciousness.  Was witnessed by her neighbor.  They were able to get her husband, who called EMS.  On arrival here in the emergency department, lab work and vital signs are unremarkable, imaging as noted below shows right distal radius and right femoral neck fracture.  ER provider discussed with orthopedic surgery Dr. Roda Shutters who plans surgical repair in [facility] later today.  Review of Systems: Please see HPI for pertinent positives and negatives. A complete 10 system review of systems are otherwise negative.  Past Medical History:  Diagnosis Date   Arthritis    Eating disorder    Hyperlipidemia    Hypertension    Osteoporosis    Right patella fracture    Wrist fracture 2013   right   Past Surgical History:  Procedure Laterality Date   AUGMENTATION MAMMAPLASTY     BREAST ENHANCEMENT SURGERY     CESAREAN SECTION     EXAM UNDER ANESTHESIA WITH MANIPULATION OF KNEE Right 06/06/2020   Procedure: EXAM UNDER ANESTHESIA WITH MANIPULATION OF KNEE;  Surgeon: Eldred Manges, MD;  Location: [facility];  Service: Orthopedics;  Laterality: Right;   HARDWARE REMOVAL Right 06/06/2020   Procedure: right knee wire removal;  Surgeon: Eldred Manges, MD;  Location:  SURGERY CENTER;  Service: Orthopedics;   Laterality: Right;   HYSTEROSCOPY     ORIF PATELLA Right 04/20/2020   Procedure: OPEN REDUCTION INTERNAL (ORIF) FIXATION RIGHT PATELLA;  Surgeon: Eldred Manges, MD;  Location: [facility] OR;  Service: Orthopedics;  Laterality: Right;    Social History:  reports that she has never smoked. She has never used smokeless tobacco. She reports that she does not drink alcohol and does not use drugs.   Allergies  Allergen Reactions   Oxycodone Itching    Family History  Problem Relation Age of Onset   Cancer Father        pancreatic   Diabetes Father    Thyroid disease Sister        hypothyroid   Osteoporosis Maternal Grandmother    Breast cancer Neg Hx      Prior to Admission medications   Medication Sig Start Date End Date Taking? Authorizing Provider  atorvastatin (LIPITOR) 10 MG tablet Take 10 mg by mouth daily. 04/21/15  Yes [provider]  lisinopril (PRINIVIL,ZESTRIL) 10 MG tablet Take 10 mg by mouth daily.   Yes [provider]  Pediatric Multiple Vitamins (CHEWABLE MULTIPLE VITAMINS PO) Take 2 tablets by mouth daily.   Yes [provider]    Physical Exam: BP (!) 153/83 (BP Location: Right Arm)   Pulse 72   Temp 99 F (37.2 C) (Oral)   Resp 18   LMP 07/10/2013   SpO2 100%   General:  Alert, oriented, calm, in no acute distress  Eyes: EOMI,  clear conjuctivae, white sclerea Neck: supple, no masses, trachea mildline  Cardiovascular: RRR, no murmurs or rubs, no peripheral edema  Respiratory: clear to auscultation bilaterally, no wheezes, no crackles  Abdomen: soft, nontender, nondistended, normal bowel tones heard  Skin: dry, no rashes  Musculoskeletal: no joint effusions, right hip tender to palpation cannot tolerate movement, right upper extremity in a splint Psychiatric: appropriate affect, normal speech  Neurologic: extraocular muscles intact, clear speech, moving all extremities with intact sensorium         Labs on Admission:  Basic Metabolic  Panel: Recent Labs  Lab 07/16/23 1035  NA 136  K 3.3*  CL 101  CO2 24  GLUCOSE 125*  BUN 13  CREATININE 0.84  CALCIUM 9.2   Liver Function Tests: No results for input(s): "AST", "ALT", "ALKPHOS", "BILITOT", "PROT", "ALBUMIN" in the last 168 hours. No results for input(s): "LIPASE", "AMYLASE" in the last 168 hours. No results for input(s): "AMMONIA" in the last 168 hours. CBC: Recent Labs  Lab 07/16/23 1035  WBC 11.0*  HGB 13.2  HCT 39.2  MCV 94.0  PLT 237   Cardiac Enzymes: No results for input(s): "CKTOTAL", "CKMB", "CKMBINDEX", "TROPONINI" in the last 168 hours.  BNP (last 3 results) No results for input(s): "BNP" in the last 8760 hours.  ProBNP (last 3 results) No results for input(s): "PROBNP" in the last 8760 hours.  CBG: No results for input(s): "GLUCAP" in the last 168 hours.  Radiological Exams on Admission: DG Pelvis 1-2 Views  Result Date: 07/16/2023 CLINICAL DATA:  Right hip pain following fall. EXAM: PELVIS - 1-2 VIEW COMPARISON:  None Available. FINDINGS: There is a comminuted fracture involving the right femoral neck with foreshortening. No additional fractures are identified. No radiopaque foreign body. IMPRESSION: Comminuted fracture involving the right femoral neck with foreshortening. Further evaluation with dedicated right hip radiographs could be performed as indicated. Electronically Signed   By: Simonne Come M.D.   On: 07/16/2023 10:29   DG Forearm Right  Result Date: 07/16/2023 CLINICAL DATA:  Post fall, now with right wrist deformity EXAM: RIGHT FOREARM - 2 VIEW COMPARISON:  Right wrist radiographs-earlier same day FINDINGS: Redemonstrated comminuted, impacted fracture of the distal radius with intra-articular extension. Redemonstrated minimally displaced ulnar styloid process fracture. No additional fractures are identified. Limited visualization of the adjacent elbow is normal given obliquity and field of view. No elbow joint effusion. Expected  adjacent soft tissue swelling.  No radiopaque foreign body. IMPRESSION: 1. Redemonstrated comminuted, impacted fracture of the distal radius and ulnar styloid process fractures, better demonstrated on dedicated right wrist radiographs performed earlier same day. 2. No additional fractures identified. Electronically Signed   By: Simonne Come M.D.   On: 07/16/2023 10:26   DG Wrist Complete Right  Result Date: 07/16/2023 CLINICAL DATA:  Post fall, now with right wrist deformity. EXAM: RIGHT WRIST - COMPLETE 3+ VIEW COMPARISON:  None Available. FINDINGS: There is a severely comminuted and impacted fracture of the distal radius, apex dorsal with intra-articular extension. Additionally, there is a minimally displaced fracture of the ulnar styloid process. The proximal carpal row remains aligned with the displaced distal radial fracture fragment. Remaining joint spaces appear preserved. Expected adjacent soft tissue swelling. No radiopaque foreign body. IMPRESSION: 1. Severely comminuted and impacted fracture of the distal radius, apex dorsal, with intra-articular extension. 2. Minimally displaced fracture of the ulnar styloid process. Electronically Signed   By: Simonne Come M.D.   On: 07/16/2023 10:23    Assessment/Plan Cheryl Glass  is a 72 y.o. female with medical history significant for osteoporosis, hypertension, hyperlipidemia being admitted to the hospital with mechanical fall resulting in right wrist and right femoral neck fracture.  Right femoral neck fracture-after mechanical fall today, in the setting of osteoporosis.  Patient is active, euvolemic and her stable baseline and an acceptable cardiopulmonary risk for surgery without further workup. -Inpatient admission to [facility] -Keep n.p.o. -Pain and nausea medication as needed -Anticipate postoperative PT/OT and DVT prophylaxis per orthopedic team  Right distal radial and ulnar styloid process fracture-planning operative repair with orthopedic  surgery  Hypokalemia-mild and inconsequential, follow with daily labs  Hypertension-continue home lisinopril in the morning, IV hydralazine as needed in the meantime  Hyperlipidemia-Lipitor  DVT prophylaxis: Lovenox     Code Status: Full Code  Consults called: None  Admission status: The appropriate patient status for this patient is INPATIENT. Inpatient status is judged to be reasonable and necessary in order to provide the required intensity of service to ensure the patient's safety. The patient's presenting symptoms, physical exam findings, and initial radiographic and laboratory data in the context of their chronic comorbidities is felt to place them at high risk for further clinical deterioration. Furthermore, it is not anticipated that the patient will be medically stable for discharge from the hospital within 2 midnights of admission.    I certify that at the point of admission it is my clinical judgment that the patient will require inpatient hospital care spanning beyond 2 midnights from the point of admission due to high intensity of service, high risk for further deterioration and high frequency of surveillance required  Time spent: 59 minutes  Cheryl Coopersmith Sharlette Dense MD [facility] Pager [phone]  If 7PM-7AM, please contact night-coverage [email or web address] Password [email or web address]  07/16/2023, 11:47 AM

## 2023-07-16 NOTE — Anesthesia Preprocedure Evaluation (Signed)
Anesthesia Evaluation  Patient identified by MRN, date of birth, ID band Patient awake    Reviewed: Allergy & Precautions, NPO status , Patient's Chart, lab work & pertinent test results, reviewed documented beta blocker date and time   Airway Mallampati: II  TM Distance: >3 FB     Dental no notable dental hx.    Pulmonary neg shortness of breath, neg sleep apnea, neg COPD, neg PE   breath sounds clear to auscultation       Cardiovascular hypertension, (-) CAD, (-) Past MI, (-) Cardiac Stents, (-) CABG and (-) Orthopnea  Rhythm:Regular Rate:Normal     Neuro/Psych neg Seizures PSYCHIATRIC DISORDERS         GI/Hepatic ,neg GERD  ,,(+) neg Cirrhosis        Endo/Other  neg diabetes    Renal/GU Renal disease     Musculoskeletal  (+) Arthritis , Osteoarthritis,    Abdominal   Peds  Hematology   Anesthesia Other Findings   Reproductive/Obstetrics                             Anesthesia Physical Anesthesia Plan  ASA: 2  Anesthesia Plan: General   Post-op Pain Management:    Induction: Intravenous  PONV Risk Score and Plan: 2 and Ondansetron  Airway Management Planned: LMA  Additional Equipment:   Intra-op Plan:   Post-operative Plan: Extubation in OR  Informed Consent: I have reviewed the patients History and Physical, chart, labs and discussed the procedure including the risks, benefits and alternatives for the proposed anesthesia with the patient or authorized representative who has indicated his/her understanding and acceptance.     Dental advisory given  Plan Discussed with: CRNA  Anesthesia Plan Comments:        Anesthesia Quick Evaluation

## 2023-07-16 NOTE — Anesthesia Procedure Notes (Signed)
Anesthesia Regional Block: Supraclavicular block   Pre-Anesthetic Checklist: , timeout performed,  Correct Patient, Correct Site, Correct Laterality,  Correct Procedure, Correct Position, site marked,  Risks and benefits discussed,  Surgical consent,  Pre-op evaluation,  At surgeon's request and post-op pain management  Laterality: Right  Prep: Maximum Sterile Barrier Precautions used, chloraprep       Needles:  Injection technique: Single-shot  Needle Type: Echogenic Needle      Needle Gauge: 20     Additional Needles:   Procedures:,,,, ultrasound used (permanent image in chart),,    Narrative:  Start time: 07/16/2023 4:20 PM End time: 07/16/2023 4:25 PM Injection made incrementally with aspirations every 5 mL.  Performed by: Personally  Anesthesiologist: Mariann Barter, MD

## 2023-07-16 NOTE — ED Provider Notes (Signed)
[place] EMERGENCY DEPARTMENT AT [facility] Provider Note   CSN: [id number] Arrival date & time: 07/16/23  0865     History  Chief Complaint  Patient presents with   Cheryl Glass    Cheryl Glass is a 72 y.o. female.  72 year old female present emergency department for evaluation after fall with right wrist and right hip pain.  Patient stepped awkwardly in a shallow depression in the ground, lost balance fell onto her right side with outstretched arm.  Does not think she hit her head, no LOC.  No chest pain no shortness of breath.  No numbness tingling changes in sensation.  Not on blood thinner   Fall       Home Medications Prior to Admission medications   Medication Sig Start Date End Date Taking? Authorizing Provider  atorvastatin (LIPITOR) 10 MG tablet Take 10 mg by mouth daily. 04/21/15  Yes [provider]  lisinopril (PRINIVIL,ZESTRIL) 10 MG tablet Take 10 mg by mouth daily.   Yes [provider]  Pediatric Multiple Vitamins (CHEWABLE MULTIPLE VITAMINS PO) Take 2 tablets by mouth daily.   Yes [provider]  aspirin EC 81 MG tablet Take 1 tablet (81 mg total) by mouth 2 (two) times daily. To be taken after surgery to prevent blood clots 07/16/23 07/15/24  Cristie Hem, PA-C  HYDROcodone-acetaminophen (NORCO) 7.5-325 MG tablet Take 1-2 tablets by mouth every 6 (six) hours as needed for moderate pain. 07/16/23   Cristie Hem, PA-C      Allergies    Oxycodone    Review of Systems   Review of Systems  Physical Exam Updated Vital Signs BP 131/81   Pulse 85   Temp 99.8 F (37.7 C) (Oral)   Resp (!) 21   Ht 5\' 3"  (1.6 m)   Wt 54.4 kg   LMP 07/10/2013   SpO2 99%   BMI 21.26 kg/m  Physical Exam Vitals and nursing note reviewed.  Constitutional:      Comments: Uncomfortable appearing  HENT:     Head: Normocephalic and atraumatic.  Eyes:     Extraocular Movements: Extraocular movements intact.      Conjunctiva/sclera: Conjunctivae normal.  Cardiovascular:     Rate and Rhythm: Normal rate.  Pulmonary:     Effort: Pulmonary effort is normal.     Breath sounds: Normal breath sounds.  Abdominal:     General: Abdomen is flat. There is no distension.     Tenderness: There is no abdominal tenderness. There is no guarding or rebound.  Musculoskeletal:     Cervical back: Normal range of motion. No tenderness.     Comments: Chest wall stable nontender.  Pelvis with some tenderness to the right hip.  Left upper extremity neurovascularly intact with no focal tenderness.  Right upper extremity with obvious deformity to the distal radius/ulna.  Good pulses, brisk cap refill.  Appears to be neurovascularly intact in fingers.  Lower extremity without focal tenderness other than to the right hip.  Neurovascularly intact.  Neurological:     Mental Status: She is alert.  Psychiatric:        Mood and Affect: Mood normal.        Behavior: Behavior normal.     ED Results / Procedures / Treatments   Labs (all labs ordered are listed, but only abnormal results are displayed) Labs Reviewed  CBC - Abnormal; Notable for the following components:      Result Value   WBC 11.0 (*)  All other components within normal limits  BASIC METABOLIC PANEL - Abnormal; Notable for the following components:   Potassium 3.3 (*)    Glucose, Bld 125 (*)    All other components within normal limits  SURGICAL PCR SCREEN  HIV ANTIBODY (ROUTINE TESTING W REFLEX)  BASIC METABOLIC PANEL  CBC  TYPE AND SCREEN  ABO/RH    EKG None  Radiology DG Pelvis 1-2 Views  Result Date: 07/16/2023 CLINICAL DATA:  Right hip pain following fall. EXAM: PELVIS - 1-2 VIEW COMPARISON:  None Available. FINDINGS: There is a comminuted fracture involving the right femoral neck with foreshortening. No additional fractures are identified. No radiopaque foreign body. IMPRESSION: Comminuted fracture involving the right femoral neck with  foreshortening. Further evaluation with dedicated right hip radiographs could be performed as indicated. Electronically Signed   By: Simonne Come M.D.   On: 07/16/2023 10:29   DG Forearm Right  Result Date: 07/16/2023 CLINICAL DATA:  Post fall, now with right wrist deformity EXAM: RIGHT FOREARM - 2 VIEW COMPARISON:  Right wrist radiographs-earlier same day FINDINGS: Redemonstrated comminuted, impacted fracture of the distal radius with intra-articular extension. Redemonstrated minimally displaced ulnar styloid process fracture. No additional fractures are identified. Limited visualization of the adjacent elbow is normal given obliquity and field of view. No elbow joint effusion. Expected adjacent soft tissue swelling.  No radiopaque foreign body. IMPRESSION: 1. Redemonstrated comminuted, impacted fracture of the distal radius and ulnar styloid process fractures, better demonstrated on dedicated right wrist radiographs performed earlier same day. 2. No additional fractures identified. Electronically Signed   By: Simonne Come M.D.   On: 07/16/2023 10:26   DG Wrist Complete Right  Result Date: 07/16/2023 CLINICAL DATA:  Post fall, now with right wrist deformity. EXAM: RIGHT WRIST - COMPLETE 3+ VIEW COMPARISON:  None Available. FINDINGS: There is a severely comminuted and impacted fracture of the distal radius, apex dorsal with intra-articular extension. Additionally, there is a minimally displaced fracture of the ulnar styloid process. The proximal carpal row remains aligned with the displaced distal radial fracture fragment. Remaining joint spaces appear preserved. Expected adjacent soft tissue swelling. No radiopaque foreign body. IMPRESSION: 1. Severely comminuted and impacted fracture of the distal radius, apex dorsal, with intra-articular extension. 2. Minimally displaced fracture of the ulnar styloid process. Electronically Signed   By: Simonne Come M.D.   On: 07/16/2023 10:23    Procedures Procedures     Medications Ordered in ED Medications  atorvastatin (LIPITOR) tablet 10 mg ( Oral Automatically Held 07/25/23 1000)  lisinopril (ZESTRIL) tablet 10 mg ( Oral Automatically Held 07/25/23 1000)  enoxaparin (LOVENOX) injection 40 mg ( Subcutaneous Automatically Held 07/24/23 1200)  acetaminophen (TYLENOL) tablet 650 mg ( Oral MAR Hold 07/16/23 1333)    Or  acetaminophen (TYLENOL) suppository 650 mg ( Rectal MAR Hold 07/16/23 1333)  HYDROcodone-acetaminophen (NORCO/VICODIN) 5-325 MG per tablet 1-2 tablet ( Oral MAR Hold 07/16/23 1333)  fentaNYL (SUBLIMAZE) injection 12.5-50 mcg ( Intravenous MAR Hold 07/16/23 1333)  traZODone (DESYREL) tablet 25 mg ( Oral MAR Hold 07/16/23 1333)  ondansetron (ZOFRAN) tablet 4 mg ( Oral MAR Hold 07/16/23 1333)    Or  ondansetron (ZOFRAN) injection 4 mg ( Intravenous MAR Hold 07/16/23 1333)  hydrALAZINE (APRESOLINE) injection 5 mg ( Intravenous MAR Hold 07/16/23 1333)  lactated ringers infusion (has no administration in time range)  ceFAZolin (ANCEF) IVPB 2g/100 mL premix (has no administration in time range)  tranexamic acid (CYKLOKAPRON) IVPB 1,000 mg (has no administration in time  range)  tranexamic acid (CYKLOKAPRON) 2,000 mg in sodium chloride 0.9 % 50 mL Topical Application (has no administration in time range)  fentaNYL (SUBLIMAZE) injection 50 mcg (50 mcg Intravenous Given 07/16/23 0932)  HYDROmorphone (DILAUDID) injection 0.5 mg (0.5 mg Intravenous Given 07/16/23 1034)  chlorhexidine (PERIDEX) 0.12 % solution 15 mL (15 mLs Mouth/Throat Given 07/16/23 1704)    Or  Oral care mouth rinse ( Mouth Rinse See Alternative 07/16/23 1704)  povidone-iodine 10 % swab 2 Application (2 Applications Topical Given 07/16/23 1704)    ED Course/ Medical Decision Making/ A&P Clinical Course as of 07/16/23 1735  Thu Jul 16, 2023  0909 Per chart review has some chronic hip pain and is seeking sports med for outpatient. [TY]  1004 Independent review of images.   Appears to have a right femoral neck fracture.  Right wrist with distal radius fracture.  Consulting Ortho.  Will get basic labs. [TY]  1020 Spoke with Ortho, Dr. Roda Shutters who will plan for surgery this afternoon.  Keep NPO.  Recommending admit to hospitalist for clearance. [TY]  1127 Patient declining CT head at this time.  States that she cannot lie flat without worsening hip pain.  Not having headache, vision changes.  Reports she did not hit her head LOC.  Shared decision making regarding foregoing CT head.  Feel this is reasonable given her mental status and neuroexam. [TY]    Clinical Course User Index [TY] Coral Spikes, DO                                 Medical Decision Making 72 year old female present emergency department for fall.  Afebrile vital signs reassuring.  She appears quite uncomfortable, will give IV fentanyl for pain as she appears to have obvious deformity to right wrist.  We will get x-rays of wrist, forearm and pelvis.  Also CT head given her age.  Consider labs, however it seems to be a mechanical fall.  Low suspicion for acute traumatic intra-abdominal pathology based on vitals and exam.  Labs unlikely to change management or disposition at this time.  See ED course for further MDM disposition.  Amount and/or Complexity of Data Reviewed Independent Historian:     Details: Spouse states she did not hit her head External Data Reviewed:     Details: Has chronic left hip pain. Labs: ordered.    Details: See above Radiology: ordered. Decision-making details documented in ED Course. ECG/medicine tests: ordered.  Risk Prescription drug management. Parenteral controlled substances. Decision regarding hospitalization.          Final Clinical Impression(s) / ED Diagnoses Final diagnoses:  None    Rx / DC Orders ED Discharge Orders     None         Coral Spikes, DO 07/16/23 1735

## 2023-07-16 NOTE — Consult Note (Signed)
ORTHOPAEDIC CONSULTATION  REQUESTING PHYSICIAN: Maryln Gottron, MD  Chief Complaint: Right femoral neck fracture and right distal radius fracture  HPI: Cheryl Glass is a 72 y.o. female with medical history significant for osteoporosis, hypertension, hyperlipidemia being admitted to the hospital with mechanical fall resulting in right wrist and right femoral neck fracture.  Patient was talking to her neighbor outside of her house, when she accidentally stepped off the pavement and onto dirt at the side of the road.  Had immediate right hip and right upper extremity pain was unable to get up off the ground on her own.  Denies hitting her head, or losing consciousness.  Was witnessed by her neighbor.  They were able to get her husband, who called EMS.  On arrival here in the emergency department, lab work and vital signs are unremarkable, imaging as noted below shows right distal radius and right femoral neck fracture  Ortho consulted for surgical evaluation.  Past Medical History:  Diagnosis Date   Arthritis    Eating disorder    Hyperlipidemia    Hypertension    Osteoporosis    Right patella fracture    Wrist fracture 2013   right   Past Surgical History:  Procedure Laterality Date   AUGMENTATION MAMMAPLASTY     BREAST ENHANCEMENT SURGERY     CESAREAN SECTION     EXAM UNDER ANESTHESIA WITH MANIPULATION OF KNEE Right 06/06/2020   Procedure: EXAM UNDER ANESTHESIA WITH MANIPULATION OF KNEE;  Surgeon: Eldred Manges, MD;  Location: [facility];  Service: Orthopedics;  Laterality: Right;   HARDWARE REMOVAL Right 06/06/2020   Procedure: right knee wire removal;  Surgeon: Eldred Manges, MD;  Location:  SUR[facility]ERY CENTER;  Service: Orthopedics;  Laterality: Right;   HYSTEROSCOPY     ORIF PATELLA Right 04/20/2020   Procedure: OPEN REDUCTION INTERNAL (ORIF) FIXATION RIGHT PATELLA;  Surgeon: Eldred Manges, MD;  Location: [facility] OR;  Service: Orthopedics;  Laterality: Right;    Social History   Socioeconomic History   Marital status: Married    Spouse name: Not on file   Number of children: Not on file   Years of education: Not on file   Highest education level: Not on file  Occupational History   Not on file  Tobacco Use   Smoking status: Never   Smokeless tobacco: Never  Vaping Use   Vaping status: Never Used  Substance and Sexual Activity   Alcohol use: No    Alcohol/week: 0.0 standard drinks of alcohol   Drug use: No   Sexual activity: Yes    Partners: Male    Birth control/protection: Post-menopausal    Comment: husband vasectomy  Other Topics Concern   Not on file  Social History Narrative   Not on file   Social Determinants of Health   Financial Resource Strain: Not on file  Food Insecurity: Not on file  Transportation Needs: Not on file  Physical Activity: Not on file  Stress: Not on file  Social Connections: Not on file   Family History  Problem Relation Age of Onset   Cancer Father        pancreatic   Diabetes Father    Thyroid disease Sister        hypothyroid   Osteoporosis Maternal Grandmother    Breast cancer Neg Hx    Allergies  Allergen Reactions   Oxycodone Itching   Prior to Admission medications   Medication Sig Start Date End Date  Taking? Authorizing Provider  atorvastatin (LIPITOR) 10 MG tablet Take 10 mg by mouth daily. 04/21/15  Yes [provider]  lisinopril (PRINIVIL,ZESTRIL) 10 MG tablet Take 10 mg by mouth daily.   Yes [provider]  Pediatric Multiple Vitamins (CHEWABLE MULTIPLE VITAMINS PO) Take 2 tablets by mouth daily.   Yes [provider]   DG Pelvis 1-2 Views  Result Date: 07/16/2023 CLINICAL DATA:  Right hip pain following fall. EXAM: PELVIS - 1-2 VIEW COMPARISON:  None Available. FINDINGS: There is a comminuted fracture involving the right femoral neck with foreshortening. No additional fractures are identified. No radiopaque foreign body. IMPRESSION: Comminuted  fracture involving the right femoral neck with foreshortening. Further evaluation with dedicated right hip radiographs could be performed as indicated. Electronically Signed   By: Simonne Come M.D.   On: 07/16/2023 10:29   DG Forearm Right  Result Date: 07/16/2023 CLINICAL DATA:  Post fall, now with right wrist deformity EXAM: RIGHT FOREARM - 2 VIEW COMPARISON:  Right wrist radiographs-earlier same day FINDINGS: Redemonstrated comminuted, impacted fracture of the distal radius with intra-articular extension. Redemonstrated minimally displaced ulnar styloid process fracture. No additional fractures are identified. Limited visualization of the adjacent elbow is normal given obliquity and field of view. No elbow joint effusion. Expected adjacent soft tissue swelling.  No radiopaque foreign body. IMPRESSION: 1. Redemonstrated comminuted, impacted fracture of the distal radius and ulnar styloid process fractures, better demonstrated on dedicated right wrist radiographs performed earlier same day. 2. No additional fractures identified. Electronically Signed   By: Simonne Come M.D.   On: 07/16/2023 10:26   DG Wrist Complete Right  Result Date: 07/16/2023 CLINICAL DATA:  Post fall, now with right wrist deformity. EXAM: RIGHT WRIST - COMPLETE 3+ VIEW COMPARISON:  None Available. FINDINGS: There is a severely comminuted and impacted fracture of the distal radius, apex dorsal with intra-articular extension. Additionally, there is a minimally displaced fracture of the ulnar styloid process. The proximal carpal row remains aligned with the displaced distal radial fracture fragment. Remaining joint spaces appear preserved. Expected adjacent soft tissue swelling. No radiopaque foreign body. IMPRESSION: 1. Severely comminuted and impacted fracture of the distal radius, apex dorsal, with intra-articular extension. 2. Minimally displaced fracture of the ulnar styloid process. Electronically Signed   By: Simonne Come M.D.   On:  07/16/2023 10:23    All pertinent xrays, MRI, CT independently reviewed and interpreted  Positive ROS: All other systems have been reviewed and were otherwise negative with the exception of those mentioned in the HPI and as above.  Physical Exam: General: No acute distress Cardiovascular: No pedal edema Respiratory: No cyanosis, no use of accessory musculature GI: No organomegaly, abdomen is soft and non-tender Skin: No lesions in the area of chief complaint Neurologic: Sensation intact distally Psychiatric: Patient is at baseline mood and affect Lymphatic: No axillary or cervical lymphadenopathy  MUSCULOSKELETAL:  RLE - pain with any movement of hip joint - skin intact - NVI distally - compartments soft  RUE - well fitting splint - fingers wwp  Assessment: Right femoral neck fracture Right distal radius fracture  Plan: - total hip replacement and ORIF of right distal radius is recommended for pain relief, quality of life and early mobilization - patient and family are aware of r/b/a and wish to proceed, informed consent obtained - medical optimization per primary team - surgery is planned for today  Thank you for the consult and the opportunity to see Ms. Cheryl Glass. Glee Arvin,  MD Aldean Baker 2:13 PM

## 2023-07-16 NOTE — Anesthesia Postprocedure Evaluation (Signed)
Anesthesia Post Note  Patient: Cheryl Glass  Procedure(s) Performed: TOTAL HIP ARTHROPLASTY ANTERIOR APPROACH (Right: Hip) OPEN REDUCTION INTERNAL FIXATION (ORIF) DISTAL RADIUS FRACTURE (Right: Arm Lower)     Patient location during evaluation: PACU Anesthesia Type: General Level of consciousness: awake and alert Pain management: pain level controlled Vital Signs Assessment: post-procedure vital signs reviewed and stable Respiratory status: spontaneous breathing, nonlabored ventilation and respiratory function stable Cardiovascular status: stable and blood pressure returned to baseline Anesthetic complications: no   No notable events documented.  Last Vitals:  Vitals:   07/16/23 2200 07/16/23 2231  BP: 135/66 (!) 145/61  Pulse: 78 70  Resp: (!) 27 18  Temp: 37.1 C 36.6 C  SpO2: 93% 100%    Last Pain:  Vitals:   07/16/23 2231  TempSrc: Oral  PainSc:                  Beryle Lathe

## 2023-07-16 NOTE — ED Triage Notes (Signed)
BIB [facility] from home. On morning walk stepped in hole and fell, c/o pain to right wrist, left hip. No obvious deformities. Denies dizziness prior to fall, denies LOC or head injury. Denies thinners.

## 2023-07-17 ENCOUNTER — Other Ambulatory Visit: Payer: Self-pay | Admitting: Physician Assistant

## 2023-07-17 DIAGNOSIS — S72011A Unspecified intracapsular fracture of right femur, initial encounter for closed fracture: Secondary | ICD-10-CM

## 2023-07-17 LAB — BASIC METABOLIC PANEL
Anion gap: 12 (ref 5–15)
BUN: 15 mg/dL (ref 8–23)
CO2: 20 mmol/L — ABNORMAL LOW (ref 22–32)
Calcium: 8.4 mg/dL — ABNORMAL LOW (ref 8.9–10.3)
Chloride: 103 mmol/L (ref 98–111)
Creatinine, Ser: 1.07 mg/dL — ABNORMAL HIGH (ref 0.44–1.00)
GFR, Estimated: 56 mL/min — ABNORMAL LOW (ref >60)
Glucose, Bld: 190 mg/dL — ABNORMAL HIGH (ref 70–99)
Potassium: 3.9 mmol/L (ref 3.5–5.1)
Sodium: 135 mmol/L (ref 135–145)

## 2023-07-17 LAB — CBC
HCT: 31.4 % — ABNORMAL LOW (ref 36.0–46.0)
Hemoglobin: 10.6 g/dL — ABNORMAL LOW (ref 12.0–15.0)
MCH: 31.5 pg (ref 26.0–34.0)
MCHC: 33.8 g/dL (ref 30.0–36.0)
MCV: 93.5 fL (ref 80.0–100.0)
Platelets: 198 10*3/uL (ref 150–400)
RBC: 3.36 MIL/uL — ABNORMAL LOW (ref 3.87–5.11)
RDW: 12.8 % (ref 11.5–15.5)
WBC: 9.4 10*3/uL (ref 4.0–10.5)
nRBC: 0 % (ref 0.0–0.2)

## 2023-07-17 LAB — HIV ANTIBODY (ROUTINE TESTING W REFLEX): HIV Screen 4th Generation wRfx: NONREACTIVE

## 2023-07-17 LAB — VITAMIN D 25 HYDROXY (VIT D DEFICIENCY, FRACTURES): Vit D, 25-Hydroxy: 40.77 ng/mL (ref 30–100)

## 2023-07-17 MED ORDER — HYDROCODONE-ACETAMINOPHEN 7.5-325 MG PO TABS: 1.0000 | ORAL_TABLET | Freq: Four times a day (QID) | ORAL | 0 refills | Status: DC | PRN

## 2023-07-17 MED ORDER — SODIUM CHLORIDE 0.9 % IV BOLUS
1000.0000 mL | Freq: Once | INTRAVENOUS | Status: AC
  Administered 2023-07-17: 1000 mL via INTRAVENOUS

## 2023-07-17 MED ORDER — ASPIRIN 81 MG PO TBEC: 81.0000 mg | DELAYED_RELEASE_TABLET | Freq: Two times a day (BID) | ORAL | 0 refills | Status: AC

## 2023-07-17 NOTE — Progress Notes (Signed)
Occupational Therapy Treatment Note  Began education regarding use of R Platform RW for mobility and compensatory strategies for ADL tasks. Will follow up tomorrow to facilitate safe DC home with husband.     07/17/23 1642  OT Visit Information  Last OT Received On 07/17/23  Assistance Needed +1  History of Present Illness The pt is a 72 yo female presenting 10/10 after a fall, pt c/o R wrist and L hip pain. Found to have R femoral neck and L distal radius fx now s/p R THA and ORIF of L distal radius 10/10. PMH includes: arthritis, HLD, HTN, osteoporosis, R patella fx, R wrist fx.  Precautions  Precautions Fall  Precaution Comments pt admitted after fall, R foot drop  Required Braces or Orthoses Sling (comfort)  Restrictions  Weight Bearing Restrictions Yes  RUE Weight Bearing Weight bear through elbow only  RLE Weight Bearing WBAT  LLE Weight Bearing WBAT  Other Position/Activity Restrictions per ortho note, pt can wt bear through platform with RUE  Upper Extremity Assessment  Upper Extremity Assessment RUE deficits/detail  RUE Deficits / Details issued foam block to encourage digit ROM; moving shoulder without difficulty - encouraged movement to reduce risk of frozen shoulder  Transfers  Overall transfer level Needs assistance  Equipment used Right platform walker  Transfers Sit to/from Stand  Sit to Stand Contact guard assist  General transfer comment able to ambulate 15 ft with CGA  Balance  Sitting balance-Leahy Scale Good  General Comments  General comments (skin integrity, edema, etc.) educated on sling use for comfort only; educated on use of platform RW  Other Exercises  Other Exercises edema control  OT - End of Session  Equipment Utilized During Treatment Gait belt;Rolling walker (2 wheels) (platform)  Activity Tolerance Patient tolerated treatment well  Patient left in chair;with call bell/phone within reach;with chair alarm set;with family/visitor present  Nurse  Communication Mobility status;Other (comment) (encourage ambulation into bathroom)  OT Assessment/Plan  OT Visit Diagnosis Unsteadiness on feet (R26.81);Other abnormalities of gait and mobility (R26.89);Muscle weakness (generalized) (M62.81);History of falling (Z91.81)  OT Frequency (ACUTE ONLY) Min 1X/week  Follow Up Recommendations No OT follow up  Patient can return home with the following A little help with walking and/or transfers;A little help with bathing/dressing/bathroom;Assistance with cooking/housework;Assist for transportation;Help with stairs or ramp for entrance  OT Equipment None recommended by OT  AM-PAC OT "6 Clicks" Daily Activity Outcome Measure (Version 2)  Help from another person eating meals? 4  Help from another person taking care of personal grooming? 3  Help from another person toileting, which includes using toliet, bedpan, or urinal? 3  Help from another person bathing (including washing, rinsing, drying)? 2  Help from another person to put on and taking off regular upper body clothing? 3  Help from another person to put on and taking off regular lower body clothing? 2  6 Click Score 17  Progressive Mobility  What is the highest level of mobility based on the progressive mobility assessment? Level 5 (Walks with assist in room/hall) - Balance while stepping forward/back and can walk in room with assist - Complete  Mobility Referral Yes  Activity Ambulated with assistance in room  OT Goal Progression  Progress towards OT goals Progressing toward goals  Acute Rehab OT Goals  Patient Stated Goal to go home  OT Goal Formulation With patient  Time For Goal Achievement 07/31/23  Potential to Achieve Goals Good  OT Time Calculation  OT Start Time (ACUTE ONLY)  1255  OT Stop Time (ACUTE ONLY) 1309  OT Time Calculation (min) 14 min  OT General Charges  $OT Visit 1 Visit  OT Treatments  $Self Care/Home Management  8-22 mins   Luisa Dago, OT/L   Acute OT Clinical  Specialist Acute Rehabilitation Services Pager [phone] Office [phone]

## 2023-07-17 NOTE — Evaluation (Signed)
Physical Therapy Evaluation Patient Details Name: Cheryl Glass MRN: [id number] DOB: [birth year] Today's Date: 07/17/2023  History of Present Illness  The pt is a 72 yo female presenting 10/10 after a fall, pt c/o R wrist and L hip pain. Found to have R femoral neck and L distal radius fx now s/p R THA and ORIF of L distal radius 10/10. PMH includes: arthritis, HLD, HTN, osteoporosis, R patella fx, R wrist fx.   Clinical Impression  Pt in bed upon arrival of PT, agreeable to evaluation at this time. Prior to admission the pt was independent with all mobility, retired, and exercising often. The pt now presents with limitations in functional mobility, strength in RLE (especially R ankle eversion and DF), balance, and activity tolerance due to above dx, and will continue to benefit from skilled PT to address these deficits. She required minA to manage bed mobility, but did rely on bed features such as HOB elevated and bed rail in addition to assist, and had drop in BP with initial changes in position. Pt did not report sx until standing, see BPs below. The pt needed minA to stand, but once standing was noted to have R foot drop and frequent R knee hyperextension with attempts to bear wt. Pt reports sensation is normal in her RLE and equal to LLE, but demos 2/5 to MMT for ankle eversion and DF, 3+/5 for ankle inversion, knee extension/flexion, hip flexion, abduction, and adduction this morning. Is able to demo good activation at quad with quad set and encouraged to perform ankle pumps and circles throughout day. Will benefit from additional skilled PT acutely prior to return home to improve safety with transfers and improved activity tolerance for changes in position prior to return home.   VITALS:  - supine in bed- BP: 141/85 - sitting EOB - BP: 114/82 (94); HR: 115bpm - standing - BP: 88/75 (81); HR: 117bpm - standing after 3 min - BP: 98/64 (76); HR: 96bpm (pt had just returned to sitting, unable to  maintain standing) - sitting EOB - 120/77 (88); HR: 89bpm    If plan is discharge home, recommend the following: A lot of help with walking and/or transfers;A lot of help with bathing/dressing/bathroom;Assistance with cooking/housework;Assist for transportation;Help with stairs or ramp for entrance   Can travel by private vehicle        Equipment Recommendations  (cane vs hemi walker)  Recommendations for Other Services       Functional Status Assessment Patient has had a recent decline in their functional status and demonstrates the ability to make significant improvements in function in a reasonable and predictable amount of time.     Precautions / Restrictions Precautions Precautions: Fall Precaution Comments: pt admitted after fall, R foot drop Required Braces or Orthoses: Sling Restrictions Weight Bearing Restrictions: Yes RUE Weight Bearing: Weight bear through elbow only (on platform only per ortho) RLE Weight Bearing: Weight bearing as tolerated Other Position/Activity Restrictions: per ortho note, pt can wt bear through platform with RUE      Mobility  Bed Mobility Overal bed mobility: Needs Assistance Bed Mobility: Supine to Sit     Supine to sit: HOB elevated, Min assist     General bed mobility comments: minA with increased time, no use of RUE, did rly on HOB elevated to assist with trunk movement    Transfers Overall transfer level: Needs assistance Equipment used: 1 person hand held assist Transfers: Sit to/from Stand, Bed to chair/wheelchair/BSC Sit to Stand: Min assist  Step pivot transfers: Mod assist, +2 safety/equipment       General transfer comment: minA to rise to standing, minimal wt through RLE but able to steady with LUE HHA. increased assist with lateral stepping due to R knee with hyperextension and foot drop making it hard to advance and bear wt.    Ambulation/Gait               General Gait Details: limited to small lateral  steps, pt with R foot drop and R knee hyperextension with attempts to bear wt will need more support to advance gait     Balance Overall balance assessment: Needs assistance Sitting-balance support: No upper extremity supported, Feet supported Sitting balance-Leahy Scale: Good     Standing balance support: Single extremity supported Standing balance-Leahy Scale: Poor                               Pertinent Vitals/Pain Pain Assessment Pain Assessment: No/denies pain    Home Living Family/patient expects to be discharged to:: Private residence Living Arrangements: Spouse/significant other Available Help at Discharge: Family;Available 24 hours/day Type of Home: House Home Access: Stairs to enter Entrance Stairs-Rails: None (also has 2 steps on deck with bilateral rails) Entrance Stairs-Number of Steps: 2   Home Layout: One level Home Equipment: Shower seat;Cane - Programmer, applications (2 wheels)      Prior Function Prior Level of Function : Independent/Modified Independent;Driving             Mobility Comments: admitted for falls, no other falls in last 6 months       Extremity/Trunk Assessment   Upper Extremity Assessment Upper Extremity Assessment: Defer to OT evaluation    Lower Extremity Assessment Lower Extremity Assessment: RLE deficits/detail RLE Deficits / Details: 2/5 for ankle eversion and DF, 3+/5 for ankle inversion, knee extension/flexion, hip flexion, abduction, and adduction. Pt reports sensation intact but that leg feels "heavy" RLE Sensation: WNL RLE Coordination: WNL    Cervical / Trunk Assessment Cervical / Trunk Assessment: Normal  Communication   Communication Communication: No apparent difficulties Cueing Techniques: Verbal cues  Cognition Arousal: Alert Behavior During Therapy: WFL for tasks assessed/performed, Anxious Overall Cognitive Status: No family/caregiver present to determine baseline cognitive functioning                                  General Comments: pt tangential and difficult to maintin attention on task. Asking repeatedly about hydration, impact of coffee on BP, and asking about potential for knee injury x4 through session despite PT answering questions. Pt able to follow simple cues, needs cues for safety and for postural awareness in standing        General Comments General comments (skin integrity, edema, etc.): orthostatic BP    Exercises General Exercises - Lower Extremity Ankle Circles/Pumps: AROM, Right, 15 reps Quad Sets: AROM, Right, 10 reps, Seated Long Arc Quad: AROM, Right, 5 reps Toe Raises: AROM, 15 reps, Seated   Assessment/Plan    PT Assessment Patient needs continued PT services  PT Problem List Decreased strength;Decreased activity tolerance;Decreased range of motion;Decreased balance;Decreased mobility;Decreased coordination;Decreased safety awareness       PT Treatment Interventions DME instruction;Gait training;Stair training;Functional mobility training;Therapeutic activities;Therapeutic exercise;Neuromuscular re-education;Balance training;Patient/family education    PT Goals (Current goals can be found in the Care Plan section)  Acute Rehab PT Goals Patient Stated  Goal: return to independence and exercise PT Goal Formulation: With patient Time For Goal Achievement: 07/31/23 Potential to Achieve Goals: Good    Frequency Min 1X/week        AM-PAC PT "6 Clicks" Mobility  Outcome Measure Help needed turning from your back to your side while in a flat bed without using bedrails?: A Little Help needed moving from lying on your back to sitting on the side of a flat bed without using bedrails?: A Lot Help needed moving to and from a bed to a chair (including a wheelchair)?: A Lot Help needed standing up from a chair using your arms (e.g., wheelchair or bedside chair)?: A Little Help needed to walk in hospital room?: Total Help needed climbing  3-5 steps with a railing? : Total 6 Click Score: 12    End of Session Equipment Utilized During Treatment: Gait belt Activity Tolerance: Treatment limited secondary to medical complications (Comment) (orthostatic) Patient left: in chair;with call bell/phone within reach;with chair alarm set Nurse Communication: Mobility status PT Visit Diagnosis: Unsteadiness on feet (R26.81);Other abnormalities of gait and mobility (R26.89);Muscle weakness (generalized) (M62.81);Difficulty in walking, not elsewhere classified (R26.2)    Time: 1610-9604 PT Time Calculation (min) (ACUTE ONLY): 48 min   Charges:   PT Evaluation $PT Eval Moderate Complexity: 1 Mod PT Treatments $Gait Training: 8-22 mins $Therapeutic Activity: 8-22 mins PT General Charges $$ ACUTE PT VISIT: 1 Visit         Vickki Muff, PT, DPT   Acute Rehabilitation Department Office [phone] Secure Chat Communication Preferred  Ronnie Derby 07/17/2023, 10:35 AM

## 2023-07-17 NOTE — Plan of Care (Signed)
  Problem: Activity: Goal: Ability to ambulate and perform ADLs will improve Outcome: Progressing   Problem: Clinical Measurements: Goal: Postoperative complications will be avoided or minimized Outcome: Progressing   Problem: Self-Concept: Goal: Ability to maintain and perform role responsibilities to the fullest extent possible will improve Outcome: Progressing   Problem: Pain Management: Goal: Pain level will decrease Outcome: Progressing   Problem: Education: Goal: Knowledge of General Education information will improve Description: Including pain rating scale, medication(s)/side effects and non-pharmacologic comfort measures Outcome: Progressing   Problem: Health Behavior/Discharge Planning: Goal: Ability to manage health-related needs will improve Outcome: Progressing   Problem: Clinical Measurements: Goal: Ability to maintain clinical measurements within normal limits will improve Outcome: Progressing Goal: Will remain free from infection Outcome: Progressing Goal: Diagnostic test results will improve Outcome: Progressing Goal: Respiratory complications will improve Outcome: Progressing

## 2023-07-17 NOTE — Evaluation (Signed)
Occupational Therapy Evaluation Patient Details Name: Cheryl Glass MRN: [id number] DOB: 07/21/1951 Today's Date: 07/17/2023   History of Present Illness The pt is a 72 yo female presenting 10/10 after a fall, pt c/o R wrist and L hip pain. Found to have R femoral neck and L distal radius fx now s/p R THA and ORIF of L distal radius 10/10. PMH includes: arthritis, HLD, HTN, osteoporosis, R patella fx, R wrist fx.   Clinical Impression   PTA pt lives at home independently with her husband and is very active. Clarified WB status of RUE and pt able to WB via RUE using platform RW. Pt with R footdrop - ordered PRAFO  - to be used only when pt in bed. If footdrop continues she can address further as an outpt if AFO is needed. Requires assistance with ADL tasks due to below listed deficits. Will return with platform to further assess mobility. No s/s of hypotension during session. Pt may need outpt OT for R wrist - can be determined at follow up visit.       If plan is discharge home, recommend the following: A little help with walking and/or transfers;A little help with bathing/dressing/bathroom;Assistance with cooking/housework;Assist for transportation;Help with stairs or ramp for entrance    Functional Status Assessment  Patient has had a recent decline in their functional status and demonstrates the ability to make significant improvements in function in a reasonable and predictable amount of time.  Equipment Recommendations       Recommendations for Other Services       Precautions / Restrictions Precautions Precautions: Fall Precaution Comments: pt admitted after fall, R foot drop Required Braces or Orthoses: Sling (for comfort) Restrictions Weight Bearing Restrictions: Yes RUE Weight Bearing: Weight bear through elbow only (on platform only per ortho) RLE Weight Bearing: Weight bearing as tolerated LLE Weight Bearing: Weight bearing as tolerated Other Position/Activity  Restrictions: per ortho note, pt can wt bear through platform with RUE      Mobility Bed Mobility               General bed mobility comments: OOB in recliner    Transfers Overall transfer level: Needs assistance Equipment used: Rolling walker (2 wheels) Transfers: Sit to/from Stand             General transfer comment: platform RW not in room at this time      Balance Overall balance assessment: Needs assistance Sitting-balance support: No upper extremity supported, Feet supported Sitting balance-Leahy Scale: Good     Standing balance support: Single extremity supported Standing balance-Leahy Scale: Poor                             ADL either performed or assessed with clinical judgement   ADL Overall ADL's : Needs assistance/impaired     Grooming: Set up;Sitting   Upper Body Bathing: Minimal assistance;Sitting   Lower Body Bathing: Moderate assistance;Sit to/from stand   Upper Body Dressing : Minimal assistance   Lower Body Dressing: Moderate assistance;Sit to/from stand   Toilet Transfer: Minimal assistance;Stand-pivot;Cueing for safety   Toileting- Clothing Manipulation and Hygiene: Minimal assistance       Functional mobility during ADLs: Minimal assistance       Vision         Perception         Praxis         Pertinent Vitals/Pain Pain Assessment Pain Assessment: No/denies pain  Extremity/Trunk Assessment Upper Extremity Assessment Upper Extremity Assessment: Right hand dominant;RUE deficits/detail RUE Deficits / Details: R radius fx; digits  edematous; keeping hand elevated RUE Coordination: decreased fine motor   Lower Extremity Assessment Lower Extremity Assessment: Defer to PT evaluation;RLE deficits/detail RLE Deficits / Details: states she has had "footdrop" in the past after wearing her knee brace however it improved over time; surrently having signicant footdrop   Cervical / Trunk  Assessment Cervical / Trunk Assessment: Normal   Communication Communication Communication: No apparent difficulties   Cognition Arousal: Alert Behavior During Therapy: WFL for tasks assessed/performed, Anxious Overall Cognitive Status: Within Functional Limits for tasks assessed                                       General Comments  VSS on RA, no s/s of changes in BP    Exercises Exercises: Other exercises Other Exercises Other Exercises: education on moving R digits frequently; importance of elevation/ice/edema control   Shoulder Instructions      Home Living Family/patient expects to be discharged to:: Private residence Living Arrangements: Spouse/significant other Available Help at Discharge: Family;Available 24 hours/day Type of Home: House Home Access: Stairs to enter [employer] of Steps: 2 Entrance Stairs-Rails: None (also has 2 steps on deck with bilateral rails) Home Layout: One level     Bathroom Shower/Tub: Chief Strategy Officer: Handicapped height Bathroom Accessibility: Yes How Accessible: Accessible via walker Home Equipment: Shower seat;Cane - Programmer, applications (2 wheels);Hand held shower head          Prior Functioning/Environment Prior Level of Function : Independent/Modified Independent;Driving;Other (comment) (exercises dialy)             Mobility Comments: admitted for falls, no other falls in last 6 months          OT Problem List: Decreased strength;Decreased range of motion;Impaired balance (sitting and/or standing);Decreased safety awareness;Decreased knowledge of use of DME or AE;Decreased knowledge of precautions;Increased edema;Impaired UE functional use      OT Treatment/Interventions: Self-care/ADL training;DME and/or AE instruction;Therapeutic activities;Splinting;Patient/family education    OT Goals(Current goals can be found in the care plan section) Acute Rehab OT Goals Patient  Stated Goal: to go home OT Goal Formulation: With patient Time For Goal Achievement: 07/31/23 Potential to Achieve Goals: Good  OT Frequency: Min 1X/week    Co-evaluation              AM-PAC OT "6 Clicks" Daily Activity     Outcome Measure Help from another person eating meals?: None Help from another person taking care of personal grooming?: A Little Help from another person toileting, which includes using toliet, bedpan, or urinal?: A Little Help from another person bathing (including washing, rinsing, drying)?: A Lot Help from another person to put on and taking off regular upper body clothing?: A Little Help from another person to put on and taking off regular lower body clothing?: A Lot 6 Click Score: 17   End of Session Equipment Utilized During Treatment: Gait belt;Rolling walker (2 wheels) (no weight bearing via RUE - supported under axilla) Nurse Communication: Mobility status;Other (comment) (recommend PRAFO for when in bed)  Activity Tolerance: Patient tolerated treatment well Patient left: in chair;with call bell/phone within reach;with chair alarm set;with family/visitor present  OT Visit Diagnosis: Unsteadiness on feet (R26.81);Other abnormalities of gait and mobility (R26.89);Muscle weakness (generalized) (M62.81);History of falling (Z91.81)  Time: 5784-6962 OT Time Calculation (min): 28 min Charges:  OT General Charges $OT Visit: 1 Visit OT Evaluation $OT Eval Low Complexity: 1 Low OT Treatments $Self Care/Home Management : 8-22 mins  Luisa Dago, OT/L   Acute OT Clinical Specialist Acute Rehabilitation Services Pager [phone] Office [phone]   [address] [employer] 07/17/2023, 4:38 PM

## 2023-07-17 NOTE — Progress Notes (Signed)
Physical Therapy Treatment Patient Details Name: Cheryl Glass MRN: [id number] DOB: [birth year] Today's Date: 07/17/2023   History of Present Illness The pt is a 72 yo female presenting 10/10 after a fall, pt c/o R wrist and L hip pain. Found to have R femoral neck and L distal radius fx now s/p R THA and ORIF of L distal radius 10/10. PMH includes: arthritis, HLD, HTN, osteoporosis, R patella fx, R wrist fx.    PT Comments  Pt in recliner upon arrival with husband present. Pt reported feeling more confident with mobility as she was able to walk to the toilet this morning. Pt with improved ability to stand with CGA and R platform walker. Pt continues to have slight R foot drop and knee hyperextension upon ambulating, however, pt was steady with no LOB. Pt was able to perform multiple sit to stand repetitions with supervision and cues for hand placement. Pt has no concerns with returning home and is agreeable to OP PT. Acute PT to follow.     If plan is discharge home, recommend the following: Assistance with cooking/housework;Assist for transportation;Help with stairs or ramp for entrance;A little help with walking and/or transfers;A little help with bathing/dressing/bathroom     Equipment Recommendations   (R platform walker)       Precautions / Restrictions Precautions Precautions: Fall Precaution Comments: pt admitted after fall, R foot drop Required Braces or Orthoses: Sling Restrictions Weight Bearing Restrictions: Yes RUE Weight Bearing: Weight bear through elbow only (on platform only per ortho) RLE Weight Bearing: Weight bearing as tolerated Other Position/Activity Restrictions: per ortho note, pt can wt bear through platform with RUE     Mobility  Bed Mobility    General bed mobility comments: nt, pt in recliner upon arrival and returned to recliner    Transfers Overall transfer level: Needs assistance Equipment used: Right platform walker Transfers: Sit to/from  Stand Sit to Stand: Contact guard assist           General transfer comment: CGA for safety, pt able to place R UE onto platform walker, assistance needed for strap    Ambulation/Gait Ambulation/Gait assistance: Contact guard assist Gait Distance (Feet): 140 Feet Assistive device: Right platform walker Gait Pattern/deviations: Decreased step length - right, Decreased dorsiflexion - right       General Gait Details: R foot drop and R knee hyperextension upon ambulation, pt able to maintain balance. Cues for decreased step length as pt tends to walk quickly         Balance Overall balance assessment: Needs assistance Sitting-balance support: No upper extremity supported, Feet supported Sitting balance-Leahy Scale: Good     Standing balance support: Single extremity supported Standing balance-Leahy Scale: Poor       Cognition Arousal: Alert Behavior During Therapy: WFL for tasks assessed/performed, Anxious Overall Cognitive Status: No family/caregiver present to determine baseline cognitive functioning        General Comments: able to follow single step commands           General Comments General comments (skin integrity, edema, etc.): VSS on RA, no s/s of changes in BP      Pertinent Vitals/Pain Pain Assessment Pain Assessment: No/denies pain     PT Goals (current goals can now be found in the care plan section) Acute Rehab PT Goals Patient Stated Goal: return to independence and exercise PT Goal Formulation: With patient Time For Goal Achievement: 07/31/23 Potential to Achieve Goals: Good Progress towards PT goals: Progressing toward goals  AM-PAC PT "6 Clicks" Mobility   Outcome Measure  Help needed turning from your back to your side while in a flat bed without using bedrails?: A Little Help needed moving from lying on your back to sitting on the side of a flat bed without using bedrails?: A Little Help needed moving to and from a bed to a  chair (including a wheelchair)?: A Little Help needed standing up from a chair using your arms (e.g., wheelchair or bedside chair)?: A Little Help needed to walk in hospital room?: A Little Help needed climbing 3-5 steps with a railing? : A Little 6 Click Score: 18    End of Session Equipment Utilized During Treatment: Gait belt Activity Tolerance: Patient tolerated treatment well (orthostatic) Patient left: in chair;with call bell/phone within reach;with chair alarm set;with family/visitor present Nurse Communication: Mobility status PT Visit Diagnosis: Unsteadiness on feet (R26.81);Other abnormalities of gait and mobility (R26.89);Muscle weakness (generalized) (M62.81);Difficulty in walking, not elsewhere classified (R26.2)     Time: 1345-1416 PT Time Calculation (min) (ACUTE ONLY): 31 min  Charges:    $Gait Training: 8-22 mins $Therapeutic Activity: 8-22 mins PT General Charges $$ ACUTE PT VISIT: 1 Visit                    Hilton Cork, PT, DPT Secure Chat Preferred  Rehab Office [phone]   Arturo Morton [facility] [employer] 07/17/2023, 4:00 PM

## 2023-07-17 NOTE — Progress Notes (Signed)
PROGRESS NOTE    Cheryl Glass  UYQ:[id number] DOB: 06/18/1951 DOA: 07/16/2023 PCP: Sigmund Hazel, MD    Brief Narrative:   Cheryl Glass is a 72 y.o. female with past medical history significant for osteoporosis, essential pretension, hyperlipidemia who presented to [facility] ED on 10/10 with right wrist, hip pain following mechanical fall.  Patient was talking to her neighbor outside when she accidentally stepped off the pavement and lost her balance causing fall.  Denies loss of consciousness or striking her head.  Following event, patient experiencing immediate right hip and right upper extremity pain and unable to get off the ground.  Husband called EMS.  Patient was transported to the ED for further evaluation.  Workup in the ED with lab work and vital signs unremarkable.  Imaging notable for right distal radius fracture and right femoral neck fracture.  ED PA discussed case with orthopedics, Dr. Roda Shutters; who recommended transfer to [facility] for surgical intervention.  TRH was consulted for admission and patient was transferred to [facility] under the care of the hospitalist service.  Assessment & Plan:   Right femoral neck fracture Patient presenting to ED with fall mechanical fall at home with inability to ambulate following event.  Pelvis x-ray with comminuted fracture involving the right femoral neck with foreshortening.  Orthopedics was consulted and patient underwent right total hip replacement by Dr. Roda Shutters on 07/16/2023. -- Orthopedics following, appreciate assistance -- Vitamin D 25-hydroxy level: Pending -- WBAT RLE -- Lovenox for DVT prophylaxis -- Tylenol as needed mild pain -- Norco every 4 hours as needed moderate/severe pain -- Fentanyl IV every 2 hours as needed severe pain not relieved with oral medication -- Robaxin PRNs every 6 hours.  Muscle spasms -- Continue PT/OT efforts while inpatient, hopeful for discharge home with home health --  Outpatient follow-up with orthopedics in 2 weeks  Right distal radial/ulnar styloid process fracture Right wrist x-ray with severely comminuted impacted fracture of the distal radius, apex dorsal with intra-articular extension, minimally displaced fracture of the ulnar styloid process.  Patient underwent ORIF by Dr. Roda Shutters on 07/16/2023. -- Platform weightbearing -- Continue PT/OT, may need platform walker versus cane on discharge  Acute postoperative blood loss anemia Hemoglobin 13.2 on admission, decreased to 10.6 postoperatively.  Likely complicated by long bone fracture as well as some blood loss anemia from surgical intervention. -- Repeat CBC in a.m.  Orthostatic hypotension Patient was notably orthostatic with associated mild dizziness when working with physical therapy this morning. -- 1 L NS bolus today -- Continue to encourage increased oral intake -- Repeat orthostatic vital signs in the a.m.  Hypokalemia Repleted, potassium 3.9 this morning.  Essential hypertension -- Lisinopril 10 mg p.o. daily  Hyperlipidemia -- Atorvastatin 10 mg p.o. daily  Osteoporosis -- Checking vitamin D 25-hydroxy level   DVT prophylaxis: SCDs Start: 07/16/23 2230 Place TED hose Start: 07/16/23 2230 enoxaparin (LOVENOX) injection 40 mg Start: 07/16/23 1200 SCDs Start: 07/16/23 1146    Code Status: Full Code Family Communication: No family present at bedside this morning  Disposition Plan:  Level of care: Med-Surg Status is: Inpatient Remains inpatient appropriate because: Pending further therapy evaluation, hopeful for discharge home in 1-2 days, IV fluid hydration    Consultants:  Orthopedics, Dr. Roda Shutters  Procedures:  Total hip arthroplasty, anterior approach, Dr. Roda Shutters 10/10 ORIF distal radius fracture, Dr. Roda Shutters 10/10  Antimicrobials:  Perioperative vancomycin, cefazolin 10/10   Subjective: Patient seen examined bedside, resting calmly.  Sitting at  edge of bed.  Therapy present at  bedside.  Therapy reports positive orthostatic vital signs this morning, patient reports some mild dizziness upon standing but very minimal.  Discussed with patient we will give her an IV fluid bolus given her prolonged n.p.o. status yesterday in the perioperative setting.  Patient hopeful for discharge home with home health soon.  No other specific questions or concerns at this time.  Pain controlled.  Denies headache, no current dizziness, no chest pain, no palpitations, no visual changes, no shortness of breath, no abdominal pain, no fever/chills/night sweats, no nausea cefonicid diarrhea, no focal weakness, no fatigue, no paresthesias.  No acute events overnight per nurse staff.  Objective: Vitals:   07/16/23 2231 07/16/23 2327 07/17/23 0427 07/17/23 0804  BP: (!) 145/61 125/65 (!) 103/59 (!) 127/51  Pulse: 70 78 79 91  Resp: 18 18 18 18   Temp: 97.9 F (36.6 C) 97.9 F (36.6 C) 98.8 F (37.1 C) 98.4 F (36.9 C)  TempSrc: Oral Oral Oral   SpO2: 100% 99% 97% 98%  Weight:      Height:        Intake/Output Summary (Last 24 hours) at 07/17/2023 1219 Last data filed at 07/17/2023 8756 Gross per 24 hour  Intake 950 ml  Output 150 ml  Net 800 ml   Filed Weights   07/16/23 1346  Weight: 54.4 kg    Examination:  Physical Exam: GEN: NAD, alert and oriented x 3, wd/wn HEENT: NCAT, PERRL, EOMI, sclera clear, MMM PULM: CTAB w/o wheezes/crackles, normal respiratory effort, on room air CV: RRR w/o M/G/R GI: abd soft, NTND, NABS, no R/G/M MSK: Right hip with surgical dressing in place, clean/dry/intact, mild swelling to right hand/fingers with dressing/splint in place, moves all extremities independently with preserved muscle strength NEURO: No focal neurological deficits, unable to move fingers but likely anesthesia block still in place PSYCH: normal mood/affect Integumentary: Right hip surgical wound as above, otherwise no other concerning rashes/lesions/wounds nonexposed skin  surfaces    Data Reviewed: I have personally reviewed following labs and imaging studies  CBC: Recent Labs  Lab 07/16/23 1035 07/17/23 0629  WBC 11.0* 9.4  HGB 13.2 10.6*  HCT 39.2 31.4*  MCV 94.0 93.5  PLT 237 198   Basic Metabolic Panel: Recent Labs  Lab 07/16/23 1035 07/17/23 0629  NA 136 135  K 3.3* 3.9  CL 101 103  CO2 24 20*  GLUCOSE 125* 190*  BUN 13 15  CREATININE 0.84 1.07*  CALCIUM 9.2 8.4*   GFR: Estimated Creatinine Clearance: 41 mL/min (A) (by C-G formula based on SCr of 1.07 mg/dL (H)). Liver Function Tests: No results for input(s): "AST", "ALT", "ALKPHOS", "BILITOT", "PROT", "ALBUMIN" in the last 168 hours. No results for input(s): "LIPASE", "AMYLASE" in the last 168 hours. No results for input(s): "AMMONIA" in the last 168 hours. Coagulation Profile: No results for input(s): "INR", "PROTIME" in the last 168 hours. Cardiac Enzymes: No results for input(s): "CKTOTAL", "CKMB", "CKMBINDEX", "TROPONINI" in the last 168 hours. BNP (last 3 results) No results for input(s): "PROBNP" in the last 8760 hours. HbA1C: No results for input(s): "HGBA1C" in the last 72 hours. CBG: No results for input(s): "GLUCAP" in the last 168 hours. Lipid Profile: No results for input(s): "CHOL", "HDL", "LDLCALC", "TRIG", "CHOLHDL", "LDLDIRECT" in the last 72 hours. Thyroid Function Tests: No results for input(s): "TSH", "T4TOTAL", "FREET4", "T3FREE", "THYROIDAB" in the last 72 hours. Anemia Panel: No results for input(s): "VITAMINB12", "FOLATE", "FERRITIN", "TIBC", "IRON", "RETICCTPCT" in the  last 72 hours. Sepsis Labs: No results for input(s): "PROCALCITON", "LATICACIDVEN" in the last 168 hours.  Recent Results (from the past 240 hour(s))  Surgical pcr screen     Status: None   Collection Time: 07/16/23  1:34 PM   Specimen: Nasal Mucosa; Nasal Swab  Result Value Ref Range Status   MRSA, PCR NEGATIVE NEGATIVE Final   Staphylococcus aureus NEGATIVE NEGATIVE Final     Comment: (NOTE) The Xpert SA Assay (FDA approved for NASAL specimens in patients 70 years of age and older), is one component of a comprehensive surveillance program. It is not intended to diagnose infection nor to guide or monitor treatment. Performed at [facility], [address] [place], [place] [address]          Radiology Studies: DG HIP UNILAT WITH PELVIS 2-3 VIEWS RIGHT  Result Date: 07/16/2023 CLINICAL DATA:  Elective surgery. EXAM: DG HIP (WITH OR WITHOUT PELVIS) 2-3V RIGHT COMPARISON:  Radiograph earlier today FINDINGS: Five fluoroscopic spot views of the pelvis and right hip obtained in the operating room. Sequential images during hip arthroplasty. Fluoroscopy time 31 seconds. Dose 3.06 mGy. IMPRESSION: Intraoperative fluoroscopy during right hip arthroplasty. Electronically Signed   By: Narda Rutherford M.D.   On: 07/16/2023 22:04   DG C-Arm 1-60 Min-No Report  Result Date: 07/16/2023 Fluoroscopy was utilized by the requesting physician.  No radiographic interpretation.   DG C-Arm 1-60 Min-No Report  Result Date: 07/16/2023 Fluoroscopy was utilized by the requesting physician.  No radiographic interpretation.   DG Pelvis 1-2 Views  Result Date: 07/16/2023 CLINICAL DATA:  Right hip pain following fall. EXAM: PELVIS - 1-2 VIEW COMPARISON:  None Available. FINDINGS: There is a comminuted fracture involving the right femoral neck with foreshortening. No additional fractures are identified. No radiopaque foreign body. IMPRESSION: Comminuted fracture involving the right femoral neck with foreshortening. Further evaluation with dedicated right hip radiographs could be performed as indicated. Electronically Signed   By: Simonne Come M.D.   On: 07/16/2023 10:29   DG Forearm Right  Result Date: 07/16/2023 CLINICAL DATA:  Post fall, now with right wrist deformity EXAM: RIGHT FOREARM - 2 VIEW COMPARISON:  Right wrist radiographs-earlier same day FINDINGS: Redemonstrated  comminuted, impacted fracture of the distal radius with intra-articular extension. Redemonstrated minimally displaced ulnar styloid process fracture. No additional fractures are identified. Limited visualization of the adjacent elbow is normal given obliquity and field of view. No elbow joint effusion. Expected adjacent soft tissue swelling.  No radiopaque foreign body. IMPRESSION: 1. Redemonstrated comminuted, impacted fracture of the distal radius and ulnar styloid process fractures, better demonstrated on dedicated right wrist radiographs performed earlier same day. 2. No additional fractures identified. Electronically Signed   By: Simonne Come M.D.   On: 07/16/2023 10:26   DG Wrist Complete Right  Result Date: 07/16/2023 CLINICAL DATA:  Post fall, now with right wrist deformity. EXAM: RIGHT WRIST - COMPLETE 3+ VIEW COMPARISON:  None Available. FINDINGS: There is a severely comminuted and impacted fracture of the distal radius, apex dorsal with intra-articular extension. Additionally, there is a minimally displaced fracture of the ulnar styloid process. The proximal carpal row remains aligned with the displaced distal radial fracture fragment. Remaining joint spaces appear preserved. Expected adjacent soft tissue swelling. No radiopaque foreign body. IMPRESSION: 1. Severely comminuted and impacted fracture of the distal radius, apex dorsal, with intra-articular extension. 2. Minimally displaced fracture of the ulnar styloid process. Electronically Signed   By: Holland Commons.D.  On: 07/16/2023 10:23        Scheduled Meds:  acetaminophen  500 mg Oral Q6H   atorvastatin  10 mg Oral Daily   docusate sodium  100 mg Oral BID   enoxaparin (LOVENOX) injection  40 mg Subcutaneous Q24H   lisinopril  10 mg Oral Daily   Continuous Infusions:   ceFAZolin (ANCEF) IV Stopped (07/17/23 0625)   methocarbamol (ROBAXIN) IV       LOS: 1 day    Time spent: 52 minutes spent on chart review, discussion with  nursing staff, consultants, updating family and interview/physical exam; more than 50% of that time was spent in counseling and/or coordination of care.    Alvira Philips [place], DO [facility] Available via Epic secure chat 7am-7pm After these hours, please refer to coverage provider listed on [email or web address] 07/17/2023, 12:19 PM

## 2023-07-17 NOTE — Progress Notes (Signed)
Subjective: 1 Day Post-Op Procedure(s) (LRB): TOTAL HIP ARTHROPLASTY ANTERIOR APPROACH (Right) OPEN REDUCTION INTERNAL FIXATION (ORIF) DISTAL RADIUS FRACTURE (Right) Patient reports pain as mild.  Feeling well this am.    Objective: Vital signs in last 24 hours: Temp:  [97.5 F (36.4 C)-99.8 F (37.7 C)] 98.8 F (37.1 C) (10/11 0427) Pulse Rate:  [70-92] 79 (10/11 0427) Resp:  [11-27] 18 (10/11 0427) BP: (103-157)/(59-87) 103/59 (10/11 0427) SpO2:  [93 %-100 %] 97 % (10/11 0427) Weight:  [54.4 kg] 54.4 kg (10/10 1346)  Intake/Output from previous day: 10/10 0701 - 10/11 0700 In: 950 [I.V.:500; IV Piggyback:450] Out: 150 [Blood:150] Intake/Output this shift: No intake/output data recorded.  Recent Labs    07/16/23 1035 07/17/23 0629  HGB 13.2 10.6*   Recent Labs    07/16/23 1035 07/17/23 0629  WBC 11.0* 9.4  RBC 4.17 3.36*  HCT 39.2 31.4*  PLT 237 198   Recent Labs    07/16/23 1035 07/17/23 0629  NA 136 135  K 3.3* 3.9  CL 101 103  CO2 24 20*  BUN 13 15  CREATININE 0.84 1.07*  GLUCOSE 125* 190*  CALCIUM 9.2 8.4*   No results for input(s): "LABPT", "INR" in the last 72 hours.  Neurologically intact Neurovascular intact Intact pulses distally Incision: dressing C/D/I No cellulitis present Compartment soft RUE- well-fitting splint in place.  Moderate swelling to fingers.  Unable to wiggle fingers or feel me touching her (block in place) RLE- dressing c/d/i   Assessment/Plan: 1 Day Post-Op Procedure(s) (LRB): TOTAL HIP ARTHROPLASTY ANTERIOR APPROACH (Right) OPEN REDUCTION INTERNAL FIXATION (ORIF) DISTAL RADIUS FRACTURE (Right) Up with therapy RUE- platform weight bearing- elevate for pain and swelling.  Block still functioning well RLE-WBAT F/u with me in two weeks D/c dispo per primary team- pain meds/dvt ppx send in to pharmacy      Cristie Hem 07/17/2023, 7:50 AM

## 2023-07-18 DIAGNOSIS — S72011A Unspecified intracapsular fracture of right femur, initial encounter for closed fracture: Secondary | ICD-10-CM | POA: Diagnosis not present

## 2023-07-18 LAB — CBC
HCT: 28.2 % — ABNORMAL LOW (ref 36.0–46.0)
Hemoglobin: 9.3 g/dL — ABNORMAL LOW (ref 12.0–15.0)
MCH: 30.8 pg (ref 26.0–34.0)
MCHC: 33 g/dL (ref 30.0–36.0)
MCV: 93.4 fL (ref 80.0–100.0)
Platelets: 165 10*3/uL (ref 150–400)
RBC: 3.02 MIL/uL — ABNORMAL LOW (ref 3.87–5.11)
RDW: 13 % (ref 11.5–15.5)
WBC: 10.5 10*3/uL (ref 4.0–10.5)
nRBC: 0 % (ref 0.0–0.2)

## 2023-07-18 LAB — BASIC METABOLIC PANEL
Anion gap: 10 (ref 5–15)
BUN: 13 mg/dL (ref 8–23)
CO2: 20 mmol/L — ABNORMAL LOW (ref 22–32)
Calcium: 8.2 mg/dL — ABNORMAL LOW (ref 8.9–10.3)
Chloride: 108 mmol/L (ref 98–111)
Creatinine, Ser: 1.13 mg/dL — ABNORMAL HIGH (ref 0.44–1.00)
GFR, Estimated: 53 mL/min — ABNORMAL LOW (ref >60)
Glucose, Bld: 114 mg/dL — ABNORMAL HIGH (ref 70–99)
Potassium: 3.9 mmol/L (ref 3.5–5.1)
Sodium: 138 mmol/L (ref 135–145)

## 2023-07-18 LAB — HEMOGLOBIN A1C
Hgb A1c MFr Bld: 5.8 % — ABNORMAL HIGH (ref 4.8–5.6)
Mean Plasma Glucose: 119.76 mg/dL

## 2023-07-18 NOTE — Progress Notes (Signed)
Physical Therapy Treatment Patient Details Name: Cheryl Glass MRN: [id number] DOB: Dec 04, 1950 Today's Date: 07/18/2023   History of Present Illness The pt is a 72 yo female presenting 10/10 after a fall, pt c/o R wrist and R hip pain. Found to have R femoral neck and  R wrist fx. Now s/p TOTAL HIP ARTHROPLASTY ANTERIOR APPROACH (Right)  OPEN REDUCTION INTERNAL FIXATION (ORIF) DISTAL RADIUS FRACTURE (Right) on 10/10. PMH includes: arthritis, HLD, HTN, osteoporosis, R patella fx, R wrist fx.    PT Comments  Pt received in supine, agreeable to therapy session and with good participation and tolerance for transfer, gait and stair training. Pt needs intermittent cues for safe R PFRW management as she was veering to L side of hallway initially. Once pt personal RW set up with R PF, pt with slightly better management of device, but still needs stand-by or Supervision, pt notified and agreeable to have spouse assist until OP therapies determine she is at modi level with device. Good RLE ROM. Pt without acute s/sx distress with functional tasks and good recall of safety with stairs. Pt given gait belt for home and receptive to instruction on LE HEP and R heel cord stretching/PRAFO use at rest. Reviewed use of ice and frequency. Pt continues to benefit from PT services to progress toward functional mobility goals.     If plan is discharge home, recommend the following: Assistance with cooking/housework;Assist for transportation;Help with stairs or ramp for entrance;A little help with walking and/or transfers;A little help with bathing/dressing/bathroom   Can travel by private vehicle        Equipment Recommendations  Rolling walker (2 wheels);Other (comment) (R platform RW (already in her room))    Recommendations for Other Services       Precautions / Restrictions Precautions Precautions: Fall Precaution Comments: pt admitted after fall, R foot drop Required Braces or Orthoses: Sling;Other  Brace Other Brace: RLE PRAFO for when resting Restrictions Weight Bearing Restrictions: Yes RUE Weight Bearing: Weight bear through elbow only RLE Weight Bearing: Weight bearing as tolerated LLE Weight Bearing: Weight bearing as tolerated Other Position/Activity Restrictions: per ortho note, pt can wt bear through platform with RUE     Mobility  Bed Mobility Overal bed mobility: Needs Assistance             General bed mobility comments: OOB in recliner    Transfers Overall transfer level: Needs assistance Equipment used: Right platform walker Transfers: Sit to/from Stand Sit to Stand: Contact guard assist           General transfer comment: cues for safe UE placement, CGA due to slight posterior lean each transfer, but no LOB or buckling.    Ambulation/Gait Ambulation/Gait assistance: Supervision Gait Distance (Feet): 250 Feet (+56ft) Assistive device: Right platform walker Gait Pattern/deviations: Decreased dorsiflexion - right, Drifts right/left, Step-through pattern       General Gait Details: Cues for decreased step length as pt tends to walk quickly and for maintaining straight path, pt veering constantly to her L side and states it is due to unfamiliar AD and pt personal RW set up with extra platform [employer] had delivered bilateral platform RW to room) and pt performed additional trial ~51ft with this and slightly less veering with this set-up.   Stairs Stairs: Yes Stairs assistance: Contact guard assist Stair Management: One rail Left, Step to pattern, Forwards (support provided at R elbow for safety) Number of Stairs: 2 General stair comments: pt able to recall proper sequencing  without cues   Wheelchair Mobility     Tilt Bed    Modified Rankin (Stroke Patients Only)       Balance Overall balance assessment: Needs assistance Sitting-balance support: No upper extremity supported, Feet supported Sitting balance-Leahy Scale: Good      Standing balance support: Single extremity supported Standing balance-Leahy Scale: Poor Standing balance comment: reliant on R Platform RW for safety                            Cognition Arousal: Alert Behavior During Therapy: WFL for tasks assessed/performed, Anxious Overall Cognitive Status: Within Functional Limits for tasks assessed                                 General Comments: able to follow single step commands well, min safety cues initially for transfers and gait due to pt veering to her L side in hallway        Exercises General Exercises - Lower Extremity Ankle Circles/Pumps: AROM, Right, 10 reps, Seated Long Arc Quad: AROM, Right, 5 reps Other Exercises Other Exercises: RLE heel cord stretch x30 sec, recomment 60 sec hold TID at least to maintain ROM, pt shown how to perform with gait belt    General Comments General comments (skin integrity, edema, etc.): pt given gait belt for home for her safety, also reviewed using gait belt for R heel cord stretch and use RLE PRAFO at night/in bed only for improved RLE DF      Pertinent Vitals/Pain Pain Assessment Pain Assessment: 0-10 Pain Score: 1  Pain Location: surgical site Pain Descriptors / Indicators: Sore Pain Intervention(s): Monitored during session     PT Goals (current goals can now be found in the care plan section) Acute Rehab PT Goals Patient Stated Goal: return to independence and exercise PT Goal Formulation: With patient Time For Goal Achievement: 07/31/23 Progress towards PT goals: Progressing toward goals    Frequency    Min 1X/week      PT Plan         AM-PAC PT "6 Clicks" Mobility   Outcome Measure  Help needed turning from your back to your side while in a flat bed without using bedrails?: A Little Help needed moving from lying on your back to sitting on the side of a flat bed without using bedrails?: A Little Help needed moving to and from a bed to a  chair (including a wheelchair)?: A Little Help needed standing up from a chair using your arms (e.g., wheelchair or bedside chair)?: A Little Help needed to walk in hospital room?: A Little Help needed climbing 3-5 steps with a railing? : A Little 6 Click Score: 18    End of Session Equipment Utilized During Treatment: Gait belt Activity Tolerance: Patient tolerated treatment well Patient left: in chair;with call bell/phone within reach;Other (comment) (RN arriving to assist with discharge) Nurse Communication: Mobility status PT Visit Diagnosis: Unsteadiness on feet (R26.81);Other abnormalities of gait and mobility (R26.89);Muscle weakness (generalized) (M62.81);Difficulty in walking, not elsewhere classified (R26.2)     Time: 0981-1914 PT Time Calculation (min) (ACUTE ONLY): 24 min  Charges:    $Gait Training: 8-22 mins $Therapeutic Activity: 8-22 mins PT General Charges $$ ACUTE PT VISIT: 1 Visit                     Artasia Thang P., PTA Acute  Rehabilitation Services Secure Chat Preferred 9a-5:30pm Office: [phone]    Dorathy Kinsman [facility] 07/18/2023, 2:08 PM

## 2023-07-18 NOTE — Progress Notes (Signed)
Occupational Therapy Treatment Patient Details Name: Cheryl Glass MRN: [id number] DOB: 1951-06-15 Today's Date: 07/18/2023   History of present illness The pt is a 72 yo female presenting 10/10 after a fall, pt c/o R wrist and L hip pain. Found to have R femoral neck and L distal radius fx now s/p R THA and ORIF of L distal radius 10/10. PMH includes: arthritis, HLD, HTN, osteoporosis, R patella fx, R wrist fx.   OT comments  Pt. Seen for skilled OT treatment session.  Pt. Able to complete UB dressing with set up and LB dressing with min a.  Cues for energy conservation and compensatory tech.  Will benefit from use of reacher.  (Has one at home) and reports she will use for lb adls.  Fall prevention hand outs provided and reviewed examples with her.  Pt. Is very active and motivated for participation to get back to her daily routines. Cont. With current acute OT poc and agree with d/c recommendations.        If plan is discharge home, recommend the following:  A little help with walking and/or transfers;A little help with bathing/dressing/bathroom;Assistance with cooking/housework;Assist for transportation;Help with stairs or ramp for entrance   Equipment Recommendations  None recommended by OT    Recommendations for Other Services      Precautions / Restrictions Precautions Precautions: Fall Precaution Comments: pt admitted after fall, R foot drop Required Braces or Orthoses: Sling Restrictions RUE Weight Bearing: Non weight bearing RLE Weight Bearing: Weight bearing as tolerated LLE Weight Bearing: Weight bearing as tolerated Other Position/Activity Restrictions: per ortho note, pt can wt bear through platform with RUE       Mobility Bed Mobility               General bed mobility comments: OOB in recliner    Transfers Overall transfer level: Needs assistance Equipment used: Right platform walker Transfers: Sit to/from Stand Sit to Stand: Contact guard assist                  Balance                                           ADL either performed or assessed with clinical judgement   ADL Overall ADL's : Needs assistance/impaired                 Upper Body Dressing : Set up;Cueing for compensatory techniques;Sitting   Lower Body Dressing: Minimal assistance;Cueing for compensatory techniques;Cueing for sequencing;Sit to/from stand;Cueing for safety Lower Body Dressing Details (indicate cue type and reason): no slip socks making donning more difficult, reviewed benefits of use of reacher, she has one at home and will use it.  also reviewed pre loading for ease.  cues for monitoring weight bearing of rue during pulling up of pants as pfrw was tipping and could be a major fall risk. she verbalized understanding and was able to move it out of the arm trough for safer pulling up of pants   Toilet Transfer Details (indicate cue type and reason): reports she has been ambulating to/from with staff and did not need to review this       Tub/Shower Transfer Details (indicate cue type and reason): reports she has a tub bench from previous sx. and did not need to review the transfer, also reports she ordered a arm protector (water proof  sleeve) for bathing and protecting rue from getting wet. also has hand held shower head Functional mobility during ADLs: Contact guard assist General ADL Comments: very motivated and active person. describes having all nec. dme from previous sx., fall prevention hand outs provided, also tightened foot length on her boot as it was sliding forward and extending length of foot plate and she needed it in the smallest setting.    Extremity/Trunk Assessment              Vision       Perception     Praxis      Cognition Arousal: Alert Behavior During Therapy: WFL for tasks assessed/performed, Anxious Overall Cognitive Status: Within Functional Limits for tasks assessed                                  General Comments: able to follow single step commands        Exercises      Shoulder Instructions       General Comments  LOVES animals and has several deer that she cares for at her house with food set up in her yard for them. Also has her dog and spends time with dogs in her n.hood along her walking route.      Pertinent Vitals/ Pain       Pain Assessment Pain Assessment: No/denies pain  Home Living                                          Prior Functioning/Environment              Frequency  Min 1X/week        Progress Toward Goals  OT Goals(current goals can now be found in the care plan section)  Progress towards OT goals: Progressing toward goals     Plan      Co-evaluation                 AM-PAC OT "6 Clicks" Daily Activity     Outcome Measure   Help from another person eating meals?: None Help from another person taking care of personal grooming?: A Little Help from another person toileting, which includes using toliet, bedpan, or urinal?: A Little Help from another person bathing (including washing, rinsing, drying)?: A Lot Help from another person to put on and taking off regular upper body clothing?: A Little Help from another person to put on and taking off regular lower body clothing?: A Lot 6 Click Score: 17    End of Session Equipment Utilized During Treatment: Rolling walker (2 wheels);Other (comment) (RPFRW)  OT Visit Diagnosis: Unsteadiness on feet (R26.81);Other abnormalities of gait and mobility (R26.89);Muscle weakness (generalized) (M62.81);History of falling (Z91.81)   Activity Tolerance Patient tolerated treatment well   Patient Left in chair;with call bell/phone within reach;with chair alarm set   Nurse Communication          Time: [phone] OT Time Calculation (min): 28 min  Charges: OT General Charges $OT Visit: 1 Visit OT Treatments $Self Care/Home Management :  23-37 mins  Boneta Lucks, COTA/L Acute Rehabilitation [phone]   Alessandra Bevels Lorraine-COTA/L 07/18/2023, 12:33 PM

## 2023-07-18 NOTE — Plan of Care (Signed)
  Problem: Pain Management: Goal: Pain level will decrease Outcome: Progressing   Problem: Education: Goal: Knowledge of General Education information will improve Description: Including pain rating scale, medication(s)/side effects and non-pharmacologic comfort measures Outcome: Progressing   Problem: Health Behavior/Discharge Planning: Goal: Ability to manage health-related needs will improve Outcome: Progressing   Problem: Clinical Measurements: Goal: Ability to maintain clinical measurements within normal limits will improve Outcome: Progressing Goal: Will remain free from infection Outcome: Progressing

## 2023-07-18 NOTE — Discharge Summary (Signed)
Physician Discharge Summary  Cheryl Glass QMV:[id number] DOB: Jan 21, 1951 DOA: 07/16/2023  PCP: Sigmund Hazel, MD  Admit date: 07/16/2023 Discharge date: 07/18/2023  Admitted From: Home Disposition: Home  Recommendations for Outpatient Follow-up:  Follow up with PCP in 1-2 weeks Follow-up with orthopedics, Dr. Roda Shutters in 2 weeks Weightbearing as tolerates right lower extremity Platform weightbearing right upper extremity with walker Continue postoperative DVT prophylaxis with aspirin 81 mg p.o. twice daily x 6 weeks per orthopedics Recommend repeat CBC 1 week for reassessment of hemoglobin level  Home Health: Outpatient physical therapy Equipment/Devices: Platform walker  Discharge Condition: Stable CODE STATUS: Full code Diet recommendation: Heart healthy diet  History of present illness:  Cheryl Glass is a 72 y.o. female with past medical history significant for osteoporosis, essential pretension, hyperlipidemia who presented to [facility] ED on 10/10 with right wrist, hip pain following mechanical fall.  Patient was talking to her neighbor outside when she accidentally stepped off the pavement and lost her balance causing fall.  Denies loss of consciousness or striking her head.  Following event, patient experiencing immediate right hip and right upper extremity pain and unable to get off the ground.  Husband called EMS.  Patient was transported to the ED for further evaluation.   Workup in the ED with lab work and vital signs unremarkable.  Imaging notable for right distal radius fracture and right femoral neck fracture.  ED PA discussed case with orthopedics, Dr. Roda Shutters; who recommended transfer to [facility] for surgical intervention.  TRH was consulted for admission and patient was transferred to [facility] under the care of the hospitalist service.  Hospital course:  Right femoral neck fracture Patient presenting to ED with fall mechanical fall at home  with inability to ambulate following event.  Pelvis x-ray with comminuted fracture involving the right femoral neck with foreshortening.  Orthopedics was consulted and patient underwent right total hip replacement by Dr. Roda Shutters on 07/16/2023.  Weightbearing as tolerated right lower extremity.  Continue aspirin 81 mg p.o. twice daily x 6 weeks for postoperative DVT prophylaxis.  Seen by PT/OT with recommendation of outpatient physical therapy.  Outpatient follow-up with orthopedics in 2 weeks   Right distal radial/ulnar styloid process fracture Right wrist x-ray with severely comminuted impacted fracture of the distal radius, apex dorsal with intra-articular extension, minimally displaced fracture of the ulnar styloid process.  Patient underwent ORIF by Dr. Roda Shutters on 07/16/2023. Platform weightbearing, outpatient follow-up with orthopedics 2 weeks.   Acute postoperative blood loss anemia Hemoglobin 13.2 on admission, decreased to 10.6 postoperatively.  Likely complicated by long bone fracture as well as some blood loss anemia from surgical intervention.  Hemoglobin 9.3 at time of discharge.  Recommend repeat CBC 1 week.   Orthostatic hypotension Patient was notably orthostatic with associated mild dizziness when working with physical therapy this morning.  Supported with IV fluid hydration with resolution.  Encourage increased oral intake.   Hypokalemia Repleted, potassium 3.9 at time of discharge.   Essential hypertension Lisinopril 10 mg p.o. daily   Hyperlipidemia Atorvastatin 10 mg p.o. daily   Osteoporosis vitamin D 25-hydroxy level 40.77  Discharge Diagnoses:  Principal Problem:   Closed subcapital fracture of neck of right femur, initial encounter [facility]) Active Problems:   Closed fracture of right distal radius and ulna, initial encounter    Discharge Instructions  Discharge Instructions     Call MD for:  difficulty breathing, headache or visual disturbances   Complete by: As  directed  Call MD for:  extreme fatigue   Complete by: As directed    Call MD for:  persistant dizziness or light-headedness   Complete by: As directed    Call MD for:  persistant nausea and vomiting   Complete by: As directed    Call MD for:  severe uncontrolled pain   Complete by: As directed    Call MD for:  temperature >100.4   Complete by: As directed    Diet - low sodium heart healthy   Complete by: As directed    Increase activity slowly   Complete by: As directed       Allergies as of 07/18/2023       Reactions   Oxycodone Itching        Medication List     TAKE these medications    aspirin EC 81 MG tablet Take 1 tablet (81 mg total) by mouth 2 (two) times daily. To be taken after surgery to prevent blood clots   atorvastatin 10 MG tablet Commonly known as: LIPITOR Take 10 mg by mouth daily.   CHEWABLE MULTIPLE VITAMINS PO Take 2 tablets by mouth daily.   HYDROcodone-acetaminophen 7.5-325 MG tablet Commonly known as: Norco Take 1-2 tablets by mouth every 6 (six) hours as needed for moderate pain.   lisinopril 10 MG tablet Commonly known as: ZESTRIL Take 10 mg by mouth daily.               Durable Medical Equipment  (From admission, onward)           Start     Ordered   07/18/23 0712  For home use only DME Walker platform  Once       Question:  Patient needs a walker to treat with the following condition  Answer:  Gait abnormality   07/18/23 2536            Follow-up Information     Cristie Hem, PA-C. Schedule an appointment as soon as possible for a visit in 2 week(s).   Specialty: Orthopedic Surgery Contact information: [address] [place] [address] [phone]         Sigmund Hazel, MD. Schedule an appointment as soon as possible for a visit in 1 week(s).   Specialty: Family Medicine Contact information: [address] [place] [address] [phone]                Allergies  Allergen  Reactions   Oxycodone Itching    Consultations: Orthopedics, Dr. Roda Shutters   Procedures/Studies: DG HIP UNILAT WITH PELVIS 2-3 VIEWS RIGHT  Result Date: 07/16/2023 CLINICAL DATA:  Elective surgery. EXAM: DG HIP (WITH OR WITHOUT PELVIS) 2-3V RIGHT COMPARISON:  Radiograph earlier today FINDINGS: Five fluoroscopic spot views of the pelvis and right hip obtained in the operating room. Sequential images during hip arthroplasty. Fluoroscopy time 31 seconds. Dose 3.06 mGy. IMPRESSION: Intraoperative fluoroscopy during right hip arthroplasty. Electronically Signed   By: Narda Rutherford M.D.   On: 07/16/2023 22:04   DG C-Arm 1-60 Min-No Report  Result Date: 07/16/2023 Fluoroscopy was utilized by the requesting physician.  No radiographic interpretation.   DG C-Arm 1-60 Min-No Report  Result Date: 07/16/2023 Fluoroscopy was utilized by the requesting physician.  No radiographic interpretation.   DG Pelvis 1-2 Views  Result Date: 07/16/2023 CLINICAL DATA:  Right hip pain following fall. EXAM: PELVIS - 1-2 VIEW COMPARISON:  None Available. FINDINGS: There is a comminuted fracture involving the right femoral neck with  foreshortening. No additional fractures are identified. No radiopaque foreign body. IMPRESSION: Comminuted fracture involving the right femoral neck with foreshortening. Further evaluation with dedicated right hip radiographs could be performed as indicated. Electronically Signed   By: Simonne Come M.D.   On: 07/16/2023 10:29   DG Forearm Right  Result Date: 07/16/2023 CLINICAL DATA:  Post fall, now with right wrist deformity EXAM: RIGHT FOREARM - 2 VIEW COMPARISON:  Right wrist radiographs-earlier same day FINDINGS: Redemonstrated comminuted, impacted fracture of the distal radius with intra-articular extension. Redemonstrated minimally displaced ulnar styloid process fracture. No additional fractures are identified. Limited visualization of the adjacent elbow is normal given obliquity and  field of view. No elbow joint effusion. Expected adjacent soft tissue swelling.  No radiopaque foreign body. IMPRESSION: 1. Redemonstrated comminuted, impacted fracture of the distal radius and ulnar styloid process fractures, better demonstrated on dedicated right wrist radiographs performed earlier same day. 2. No additional fractures identified. Electronically Signed   By: Simonne Come M.D.   On: 07/16/2023 10:26   DG Wrist Complete Right  Result Date: 07/16/2023 CLINICAL DATA:  Post fall, now with right wrist deformity. EXAM: RIGHT WRIST - COMPLETE 3+ VIEW COMPARISON:  None Available. FINDINGS: There is a severely comminuted and impacted fracture of the distal radius, apex dorsal with intra-articular extension. Additionally, there is a minimally displaced fracture of the ulnar styloid process. The proximal carpal row remains aligned with the displaced distal radial fracture fragment. Remaining joint spaces appear preserved. Expected adjacent soft tissue swelling. No radiopaque foreign body. IMPRESSION: 1. Severely comminuted and impacted fracture of the distal radius, apex dorsal, with intra-articular extension. 2. Minimally displaced fracture of the ulnar styloid process. Electronically Signed   By: Simonne Come M.D.   On: 07/16/2023 10:23     Subjective: Patient seen examined bedside, resting comfortably .  Sitting in bedside chair.  No specific complaints this morning.  Pain controlled.  Seen by physical/occupational therapy, with recommendation of outpatient PT.  Ready for discharge home.  Denies headache, no dizziness, no chest pain, no palpitations, no shortness of breath, no abdominal pain, no fever/chills/night sweats, no nausea/vomiting/diarrhea, no focal weakness, no fatigue, no paresthesias.  No acute events overnight per nursing staff.  Discharge Exam: Vitals:   07/17/23 2053 07/18/23 0614  BP: 115/62 (!) 144/72  Pulse: 75 70  Resp: 18 17  Temp: 99.1 F (37.3 C) 98.5 F (36.9 C)   SpO2: 98% 100%   Vitals:   07/17/23 0804 07/17/23 1611 07/17/23 2053 07/18/23 0614  BP: (!) 127/51 (!) 142/66 115/62 (!) 144/72  Pulse: 91 73 75 70  Resp: 18 18 18 17   Temp: 98.4 F (36.9 C) 99.3 F (37.4 C) 99.1 F (37.3 C) 98.5 F (36.9 C)  TempSrc:   Oral   SpO2: 98% 91% 98% 100%  Weight:      Height:        Physical Exam: GEN: NAD, alert and oriented x 3, wd/wn HEENT: NCAT, PERRL, EOMI, sclera clear, MMM PULM: CTAB w/o wheezes/crackles, normal respiratory effort, on room air CV: RRR w/o M/G/R GI: abd soft, NTND, NABS, no R/G/M MSK: Right hip with surgical dressing in place, clean/dry/intact, right upper extremity with dressing/splint in place, neurovascularly intact, moves all extremities independently with preserved muscle strength NEURO: No focal neurological deficits PSYCH: normal mood/affect Integumentary: Right hip surgical wound as above, otherwise no other concerning rashes/lesions/wounds nonexposed skin surfaces    The results of significant diagnostics from this hospitalization (including imaging, microbiology, ancillary  and laboratory) are listed below for reference.     Microbiology: Recent Results (from the past 240 hour(s))  Surgical pcr screen     Status: None   Collection Time: 07/16/23  1:34 PM   Specimen: Nasal Mucosa; Nasal Swab  Result Value Ref Range Status   MRSA, PCR NEGATIVE NEGATIVE Final   Staphylococcus aureus NEGATIVE NEGATIVE Final    Comment: (NOTE) The Xpert SA Assay (FDA approved for NASAL specimens in patients 78 years of age and older), is one component of a comprehensive surveillance program. It is not intended to diagnose infection nor to guide or monitor treatment. Performed at [facility], [address], [place], [place] [address]      Labs: BNP (last 3 results) No results for input(s): "BNP" in the last 8760 hours. Basic Metabolic Panel: Recent Labs  Lab 07/16/23 1035 07/17/23 0629 07/18/23 0421  NA 136  135 138  K 3.3* 3.9 3.9  CL 101 103 108  CO2 24 20* 20*  GLUCOSE 125* 190* 114*  BUN 13 15 13   CREATININE 0.84 1.07* 1.13*  CALCIUM 9.2 8.4* 8.2*   Liver Function Tests: No results for input(s): "AST", "ALT", "ALKPHOS", "BILITOT", "PROT", "ALBUMIN" in the last 168 hours. No results for input(s): "LIPASE", "AMYLASE" in the last 168 hours. No results for input(s): "AMMONIA" in the last 168 hours. CBC: Recent Labs  Lab 07/16/23 1035 07/17/23 0629 07/18/23 0421  WBC 11.0* 9.4 10.5  HGB 13.2 10.6* 9.3*  HCT 39.2 31.4* 28.2*  MCV 94.0 93.5 93.4  PLT 237 198 165   Cardiac Enzymes: No results for input(s): "CKTOTAL", "CKMB", "CKMBINDEX", "TROPONINI" in the last 168 hours. BNP: Invalid input(s): "POCBNP" CBG: No results for input(s): "GLUCAP" in the last 168 hours. D-Dimer No results for input(s): "DDIMER" in the last 72 hours. Hgb A1c No results for input(s): "HGBA1C" in the last 72 hours. Lipid Profile No results for input(s): "CHOL", "HDL", "LDLCALC", "TRIG", "CHOLHDL", "LDLDIRECT" in the last 72 hours. Thyroid function studies No results for input(s): "TSH", "T4TOTAL", "T3FREE", "THYROIDAB" in the last 72 hours.  Invalid input(s): "FREET3" Anemia work up No results for input(s): "VITAMINB12", "FOLATE", "FERRITIN", "TIBC", "IRON", "RETICCTPCT" in the last 72 hours. Urinalysis No results found for: "COLORURINE", "APPEARANCEUR", "LABSPEC", "PHURINE", "GLUCOSEU", "HGBUR", "BILIRUBINUR", "KETONESUR", "PROTEINUR", "UROBILINOGEN", "NITRITE", "LEUKOCYTESUR" Sepsis Labs Recent Labs  Lab 07/16/23 1035 07/17/23 0629 07/18/23 0421  WBC 11.0* 9.4 10.5   Microbiology Recent Results (from the past 240 hour(s))  Surgical pcr screen     Status: None   Collection Time: 07/16/23  1:34 PM   Specimen: Nasal Mucosa; Nasal Swab  Result Value Ref Range Status   MRSA, PCR NEGATIVE NEGATIVE Final   Staphylococcus aureus NEGATIVE NEGATIVE Final    Comment: (NOTE) The Xpert SA Assay (FDA  approved for NASAL specimens in patients 49 years of age and older), is one component of a comprehensive surveillance program. It is not intended to diagnose infection nor to guide or monitor treatment. Performed at [facility], [address], [place], [place] [address]      Time coordinating discharge: Over 30 minutes  SIGNED:   Alvira Philips [place], DO  [facility] 07/18/2023, 9:39 AM

## 2023-07-18 NOTE — TOC Transition Note (Addendum)
Transition of Care [facility]) - CM/SW Discharge Note   Patient Details  Name: Cheryl Glass MRN: [id number] Date of Birth: May 13, 1951  Transition of Care [facility]) CM/SW Contact:  Ronny Bacon, RN Phone Number: 07/18/2023, 10:04 AM   Clinical Narrative:  Patient is being discharged today. RW with platform ordered through [employer] to be delivered to bedside before discharge. Outpatient rehab recommended by physical therapy noted. Referral # [id number], placed to [facility].   1045: Call from [employer] [employer], patient unable to get new rolling walker due to receiving one in recent past. Patient can get the platform and will be delivered to bedside before discharge.    Final next level of care: Home/Self Care Barriers to Discharge: No Barriers Identified   Patient Goals and CMS Choice      Discharge Placement                         Discharge Plan and Services Additional resources added to the After Visit Summary for                  DME Arranged: Walker platform, Walker rolling DME Agency: [employer] Date DME Agency Contacted: 07/18/23 Time DME Agency Contacted: [phone] Representative spoke with at DME Agency: [facility]            Social Determinants of Health (SDOH) Interventions SDOH Screenings   Tobacco Use: Low Risk  (07/16/2023)     Readmission Risk Interventions     No data to display

## 2023-07-20 ENCOUNTER — Encounter (HOSPITAL_COMMUNITY): Payer: Self-pay | Admitting: Orthopaedic Surgery

## 2023-07-24 ENCOUNTER — Encounter (HOSPITAL_BASED_OUTPATIENT_CLINIC_OR_DEPARTMENT_OTHER): Payer: Self-pay | Admitting: Physical Therapy

## 2023-07-24 ENCOUNTER — Other Ambulatory Visit: Payer: Self-pay

## 2023-07-24 ENCOUNTER — Ambulatory Visit (HOSPITAL_BASED_OUTPATIENT_CLINIC_OR_DEPARTMENT_OTHER): Payer: Medicare HMO | Attending: Internal Medicine | Admitting: Physical Therapy

## 2023-07-24 DIAGNOSIS — R2689 Other abnormalities of gait and mobility: Secondary | ICD-10-CM | POA: Diagnosis not present

## 2023-07-24 DIAGNOSIS — M25531 Pain in right wrist: Secondary | ICD-10-CM | POA: Diagnosis not present

## 2023-07-24 DIAGNOSIS — M6281 Muscle weakness (generalized): Secondary | ICD-10-CM | POA: Diagnosis not present

## 2023-07-24 DIAGNOSIS — R29898 Other symptoms and signs involving the musculoskeletal system: Secondary | ICD-10-CM | POA: Insufficient documentation

## 2023-07-24 DIAGNOSIS — M25551 Pain in right hip: Secondary | ICD-10-CM | POA: Diagnosis not present

## 2023-07-24 NOTE — Therapy (Signed)
OUTPATIENT PHYSICAL THERAPY LOWER EXTREMITY EVALUATION   Patient Name: Cheryl Glass MRN: [id number] DOB:1951/02/05, 72 y.o., female Today's Date: 07/24/2023  END OF SESSION:  PT End of Session - 07/24/23 1307     Visit Number 1    Number of Visits 16    Date for PT Re-Evaluation 09/18/23    Authorization Type [employer]    PT Start Time 1304    PT Stop Time 1352    PT Time Calculation (min) 48 min    Activity Tolerance Patient tolerated treatment well    Behavior During Therapy WFL for tasks assessed/performed             Past Medical History:  Diagnosis Date   Arthritis    Eating disorder    Hyperlipidemia    Hypertension    Osteoporosis    Right patella fracture    Wrist fracture 2013   right   Past Surgical History:  Procedure Laterality Date   AUGMENTATION MAMMAPLASTY     BREAST ENHANCEMENT SURGERY     CESAREAN SECTION     EXAM UNDER ANESTHESIA WITH MANIPULATION OF KNEE Right 06/06/2020   Procedure: EXAM UNDER ANESTHESIA WITH MANIPULATION OF KNEE;  Surgeon: Eldred Manges, MD;  Location: [facility];  Service: Orthopedics;  Laterality: Right;   HARDWARE REMOVAL Right 06/06/2020   Procedure: right knee wire removal;  Surgeon: Eldred Manges, MD;  Location: [facility];  Service: Orthopedics;  Laterality: Right;   HYSTEROSCOPY     OPEN REDUCTION INTERNAL FIXATION (ORIF) DISTAL RADIAL FRACTURE Right 07/16/2023   Procedure: OPEN REDUCTION INTERNAL FIXATION (ORIF) DISTAL RADIUS FRACTURE;  Surgeon: Tarry Kos, MD;  Location: [facility];  Service: Orthopedics;  Laterality: Right;   ORIF PATELLA Right 04/20/2020   Procedure: OPEN REDUCTION INTERNAL (ORIF) FIXATION RIGHT PATELLA;  Surgeon: Eldred Manges, MD;  Location: [facility];  Service: Orthopedics;  Laterality: Right;   TOTAL HIP ARTHROPLASTY Right 07/16/2023   Procedure: TOTAL HIP ARTHROPLASTY ANTERIOR APPROACH;  Surgeon: Tarry Kos, MD;  Location: [facility];  Service: Orthopedics;   Laterality: Right;   Patient Active Problem List   Diagnosis Date Noted   Closed subcapital fracture of neck of right femur, initial encounter (HCC) 07/16/2023   Closed fracture of right distal radius and ulna, initial encounter 07/16/2023    PCP: Sigmund Hazel MD  REFERRING PROVIDER: [place], Eric J, DO; Surgeon: Gershon Mussel MD  REFERRING DIAG: R53.1 (ICD-10-CM) - Weakness [phone] (ICD-10-CM) - Status post right hip replacement Z98.890 (ICD-10-CM) - Status post wrist surgery  THERAPY DIAG:  Pain in right hip  Pain in right wrist  Muscle weakness (generalized)  Other abnormalities of gait and mobility  Other symptoms and signs involving the musculoskeletal system  Rationale for Evaluation and Treatment: Rehabilitation  ONSET DATE: DOS 07/16/23  SUBJECTIVE:   SUBJECTIVE STATEMENT: 72 yo female presented to ED 10/10 after a fall, pt c/o R wrist and R hip pain. Found to have R femoral neck and R distal radius fx now s/p R THA and ORIF of R distal radius 10/10. R leg pain but she is able walk with platform walker. Can't use R arm at all. Normally very active with exercise and walking.   PERTINENT HISTORY: Hx ORIF Rt patella 04/20/2020, wire removal 06/06/2020; osteoporosis, HTN, hyperlipidemia PAIN:  Are you having pain? Yes: NPRS scale: 3 hip; wrist 0/10 Pain location: R hip; R wrist Pain description: achy Aggravating factors: movement Relieving factors: rest  PRECAUTIONS: None s/p anterior THA  WEIGHT BEARING RESTRICTIONS:  WBAT RLE, platform R UE  FALLS:  Has patient fallen in last 6 months? Yes. Number of falls 1   PLOF: Independent  PATIENT GOALS: get back to normal   OBJECTIVE: (objective measures from initial evaluation unless otherwise dated)   PATIENT SURVEYS:  FOTO hip 23% function, wrist 37% function  COGNITION: Overall cognitive status: Within functional limits for tasks assessed     SENSATION: WFL   POSTURE: rounded shoulders and forward  head  PALPATION: Slightly tender at incison  LOWER EXTREMITY ROM:  Active ROM Right eval Left eval  Hip flexion    Hip extension    Hip abduction    Hip adduction    Hip internal rotation    Hip external rotation    Knee flexion    Knee extension    Ankle dorsiflexion    Ankle plantarflexion    Ankle inversion    Ankle eversion     (Blank rows = not tested) *= pain/symptoms  LOWER EXTREMITY MMT:  MMT Right eval Left eval  Hip flexion  4-  Hip extension    Hip abduction    Hip adduction    Hip internal rotation    Hip external rotation    Knee flexion 4+ 5  Knee extension 3 5  Ankle dorsiflexion 5 5  Ankle plantarflexion    Ankle inversion    Ankle eversion     (Blank rows = not tested) *= pain/symptoms  UPPER EXTREMITY ROM: WFL for tasks assessed, arm in cast    UPPER EXTREMITY MMT:  WFL for tasks assessed, arm in cast    Transfers:  STS with min G/A; assist for for balance and weight shifting.   GAIT: Distance walked: not completed  Assistive device utilized:  hand held assist Level of assistance: SBA Comments: Transfer to standing, guarding on RLE; did not ambulate due to pain, able to tolerate weight shifts in standing   TODAY'S TREATMENT:                                                                                                                              DATE:  07/24/23 LAQ 10 x 5 second holds  Standing weight shifts 10x  Quad set 10 x 10 second holds Glute sets 10 x 10 second holds Ankle pumps x 20   PATIENT EDUCATION:  Education details: Patient educated on exam findings, POC, scope of PT, HEP, and frequent mobility, THA rehab. Person educated: Patient Education method: Explanation, Demonstration, and Handouts Education comprehension: verbalized understanding, returned demonstration, verbal cues required, and tactile cues required  HOME EXERCISE PROGRAM: Access Code: [email or web address] URL: [email or web address] Date:  07/24/2023 Prepared by: Greig Castilla Maralyn Witherell  Exercises - Side to Side Weight Shift with Counter Support  - 5 x daily - 7 x weekly - 2 sets - 10 reps - Seated Long Arc Quad  - 3 x daily - 7 x  weekly - 2 sets - 10 reps - 5 second hold - Supine Quadricep Sets (Mirrored)  - 3 x daily - 7 x weekly - 10 reps - 10 second hold - Supine Gluteal Sets  - 3 x daily - 7 x weekly - 10 reps - 10 second hold - Supine Ankle Pumps  - 3 x daily - 7 x weekly - 2 sets - 10 reps  ASSESSMENT:  CLINICAL IMPRESSION: Patient a 72 y.o. y.o. female who was seen today for physical therapy evaluation and treatment for s/p R THA and R radius ORIF on 07/16/23. Patient presents with pain limited deficits in R hip and wrist strength, ROM, endurance, activity tolerance, gait, balance, and functional mobility with ADL. Patient is having to modify and restrict ADL as indicated by outcome measure score as well as subjective information and objective measures which is affecting overall participation. Patient will benefit from skilled physical therapy in order to improve function and reduce impairment.  OBJECTIVE IMPAIRMENTS: Abnormal gait, decreased activity tolerance, decreased balance, decreased endurance, decreased mobility, difficulty walking, decreased ROM, decreased strength, increased muscle spasms, impaired flexibility, improper body mechanics, and pain.   ACTIVITY LIMITATIONS: carrying, lifting, bending, sitting, standing, squatting, sleeping, stairs, transfers, bathing, hygiene/grooming, locomotion level, and caring for others  PARTICIPATION LIMITATIONS: meal prep, cleaning, laundry, driving, shopping, community activity, and yard work  PERSONAL FACTORS: Time since onset of injury/illness/exacerbation and 3+ comorbidities: osteoporosis, HTN, hyperlipidemia, hx wrist fracture  are also affecting patient's functional outcome.   REHAB POTENTIAL: Good  CLINICAL DECISION MAKING: Stable/uncomplicated  EVALUATION COMPLEXITY:  Low   GOALS: Goals reviewed with patient? Yes  SHORT TERM GOALS: Target date: 08/21/2023    Patient will be independent with HEP in order to improve functional outcomes. Baseline: Goal status: INITIAL  2.  Patient will report at least 25% improvement in symptoms for improved quality of life. Baseline: Goal status: INITIAL    LONG TERM GOALS: Target date: 09/18/2023    Patient will report at least 75% improvement in symptoms for improved quality of life. Baseline:  Goal status: INITIAL  2.  Patient will improve FOTO score to predicted outcomes in order to indicate improved tolerance to activity. Baseline: hip 23% function, wrist 37% function Goal status: INITIAL  3.  Patient will be able to navigate stairs with reciprocal pattern without compensation in order to demonstrate improved LE strength. Baseline:  Goal status: INITIAL  4.  Patient will demonstrate grade of 4+/5 MMT grade in all tested musculature as evidence of improved strength to assist with stair ambulation and gait.   Baseline:  Goal status: INITIAL  5.  Patient will be able to complete 5x STS in under 14 seconds in order to demonstrate improved functional strength. Baseline:  Goal status: INITIAL    PLAN:  PT FREQUENCY: 1-2x/week  PT DURATION: 8 weeks  PLANNED INTERVENTIONS: 97164- PT Re-evaluation, 97110-Therapeutic exercises, 97530- Therapeutic activity, 97112- Neuromuscular re-education, 97535- Self Care, 44034- Manual therapy, [phone]- Gait training, [phone]- Orthotic Fit/training, [phone]- Canalith repositioning, [id number]- Aquatic Therapy, [phone]- Splinting, Patient/Family education, Balance training, Stair training, Taping, Dry Needling, Joint mobilization, Joint manipulation, Spinal manipulation, Spinal mobilization, Scar mobilization, and DME instructions.  PLAN FOR NEXT SESSION: standing, gait training (platform for R UE), RLE/hip strength and mobility and progress as able   Wyman Songster,  PT 07/24/2023, 4:13 PM    Referring diagnosis? R53.1 (ICD-10-CM) - Weakness Z96.641 (ICD-10-CM) - Status post right hip replacement Z98.890 (ICD-10-CM) - Status post wrist surgery Treatment diagnosis? (if  different than referring diagnosis) M25.551 What was this (referring dx) caused by? [x]  Surgery [x]  Fall []  Ongoing issue []  Arthritis []  Other: ____________  Laterality: [x]  Rt []  Lt []  Both  Check all possible CPT codes:  *CHOOSE 10 OR LESS*    [x]  84166 (Therapeutic Exercise)  []  92507 (SLP Treatment)  [x]  97112 (Neuro Re-ed)   []  92526 (Swallowing Treatment)   [x]  97116 (Gait Training)   []  [id number] (Cognitive Training, 1st 15 minutes) [x]  97140 (Manual Therapy)   []  97130 (Cognitive Training, each add'l 15 minutes)  [x]  97164 (Re-evaluation)                              []  Other, List CPT Code ____________  [x]  97530 (Therapeutic Activities)     [x]  97535 (Self Care)   [x]  All codes above (97110 - 97535)  []  97012 (Mechanical Traction)  []  97014 (E-stim Unattended)  []  97032 (E-stim manual)  []  97033 (Ionto)  []  97035 (Ultrasound) []  97750 (Physical Performance Training) [x]  [id number] (Aquatic Therapy) []  97016 (Vasopneumatic Device) []  06301 (Paraffin) []  97034 (Contrast Bath) []  97597 (Wound Care 1st 20 sq cm) []  97598 (Wound Care each add'l 20 sq cm) []  97760 (Orthotic Fabrication, Fitting, Training Initial) []  [id number] (Prosthetic Management and Training Initial) []  [id number] (Orthotic or Prosthetic Training/ Modification Subsequent)

## 2023-07-28 ENCOUNTER — Ambulatory Visit (INDEPENDENT_AMBULATORY_CARE_PROVIDER_SITE_OTHER): Payer: Medicare HMO | Admitting: Orthopaedic Surgery

## 2023-07-28 ENCOUNTER — Other Ambulatory Visit (INDEPENDENT_AMBULATORY_CARE_PROVIDER_SITE_OTHER): Payer: Self-pay

## 2023-07-28 ENCOUNTER — Encounter: Payer: Self-pay | Admitting: Physician Assistant

## 2023-07-28 DIAGNOSIS — S52601A Unspecified fracture of lower end of right ulna, initial encounter for closed fracture: Secondary | ICD-10-CM

## 2023-07-28 DIAGNOSIS — S52501A Unspecified fracture of the lower end of right radius, initial encounter for closed fracture: Secondary | ICD-10-CM

## 2023-07-28 DIAGNOSIS — Z96641 Presence of right artificial hip joint: Secondary | ICD-10-CM

## 2023-07-28 DIAGNOSIS — S72011A Unspecified intracapsular fracture of right femur, initial encounter for closed fracture: Secondary | ICD-10-CM

## 2023-07-28 NOTE — Progress Notes (Signed)
Post-Op Visit Note   Patient: Cheryl Glass           Date of Birth: 1951/06/08           MRN: [id number] Visit Date: 07/28/2023 PCP: Sigmund Hazel, MD   Assessment & Plan:  Chief Complaint:  Chief Complaint  Patient presents with   Right Hip - Routine Post Op    07/16/23 RT THA   Right Wrist - Follow-up   Visit Diagnoses:  1. Status post total hip replacement, right   2. Closed fracture of right distal radius and ulna, initial encounter   3. Closed subcapital fracture of neck of right femur, initial encounter [facility])     Plan: Patient is a pleasant 72 year old female who comes in today nearly 2 weeks status post right total hip replacement from a subcapital femur fracture as well as ORIF right distal radius fracture, date of surgery 07/16/2023.  Patient has been doing okay.  She has been compliant wearing a sugar-tong splint to the right upper extremity.  She is in no pain to the right arm.  No paresthesias there.  She has been platform weightbearing with a walker.  In regards to her hip, she is having more of muscular pain to the thigh.  She is only taking Tylenol for this.  She is not interested in taking anything stronger.  She has been to 1 outpatient physical therapy visit which caused terrible pain.  She has been compliant taking a baby aspirin twice daily for DVT prophylaxis.  She reports no falls.  Examination of her right wrist shows healed surgical incision.  No signs of infection.  Exam of the right hip shows healed surgical incision.  Leg length discrepancy.  There is pain with range of motion of the hip.  For the wrist we remove the sutures and placed her in a [place] cast.  Continue with platform weightbearing.  For the hip we will obtain a CT scan to further clarify the periprosthetic fracture.  She will need an ORIF and revision of the femoral stem.  Will place her in a wheelchair for now and she can only weight-bear with transfers.  Will plan for surgery on  Friday.  Follow-Up Instructions: No follow-ups on file.   Orders:  Orders Placed This Encounter  Procedures   XR HIP UNILAT W OR W/O PELVIS 2-3 VIEWS RIGHT   XR Wrist Complete Right   CT HIP RIGHT WO CONTRAST   No orders of the defined types were placed in this encounter.   Imaging: XR HIP UNILAT W OR W/O PELVIS 2-3 VIEWS RIGHT  Result Date: 07/28/2023 X-rays of the right hip demonstrates a status post right total hip replacement.  There is a periprosthetic fracture of the greater trochanter into the lesser trochanter with subsidence of the femoral component.  Shortening of the leg is present.  Acetabular component with associated screws uncomplicated.  XR Wrist Complete Right  Result Date: 07/28/2023 X-rays of the right wrist demonstrate volar locking plate with associated screws.  Alignment of the fracture is unchanged.  There is no hardware complications.  There has not been any interval collapse of the fracture fragments.   PMFS History: Patient Active Problem List   Diagnosis Date Noted   Closed subcapital fracture of neck of right femur, initial encounter (HCC) 07/16/2023   Closed fracture of right distal radius and ulna, initial encounter 07/16/2023   Past Medical History:  Diagnosis Date   Arthritis    Eating disorder  Hyperlipidemia    Hypertension    Osteoporosis    Right patella fracture    Wrist fracture 2013   right    Family History  Problem Relation Age of Onset   Cancer Father        pancreatic   Diabetes Father    Thyroid disease Sister        hypothyroid   Osteoporosis Maternal Grandmother    Breast cancer Neg Hx     Past Surgical History:  Procedure Laterality Date   AUGMENTATION MAMMAPLASTY     BREAST ENHANCEMENT SURGERY     CESAREAN SECTION     EXAM UNDER ANESTHESIA WITH MANIPULATION OF KNEE Right 06/06/2020   Procedure: EXAM UNDER ANESTHESIA WITH MANIPULATION OF KNEE;  Surgeon: Eldred Manges, MD;  Location: [facility];   Service: Orthopedics;  Laterality: Right;   HARDWARE REMOVAL Right 06/06/2020   Procedure: right knee wire removal;  Surgeon: Eldred Manges, MD;  Location: [facility];  Service: Orthopedics;  Laterality: Right;   HYSTEROSCOPY     OPEN REDUCTION INTERNAL FIXATION (ORIF) DISTAL RADIAL FRACTURE Right 07/16/2023   Procedure: OPEN REDUCTION INTERNAL FIXATION (ORIF) DISTAL RADIUS FRACTURE;  Surgeon: Tarry Kos, MD;  Location: [facility];  Service: Orthopedics;  Laterality: Right;   ORIF PATELLA Right 04/20/2020   Procedure: OPEN REDUCTION INTERNAL (ORIF) FIXATION RIGHT PATELLA;  Surgeon: Eldred Manges, MD;  Location: [facility];  Service: Orthopedics;  Laterality: Right;   TOTAL HIP ARTHROPLASTY Right 07/16/2023   Procedure: TOTAL HIP ARTHROPLASTY ANTERIOR APPROACH;  Surgeon: Tarry Kos, MD;  Location: [facility];  Service: Orthopedics;  Laterality: Right;   Social History   Occupational History   Not on file  Tobacco Use   Smoking status: Never   Smokeless tobacco: Never  Vaping Use   Vaping status: Never Used  Substance and Sexual Activity   Alcohol use: No    Alcohol/week: 0.0 standard drinks of alcohol   Drug use: No   Sexual activity: Yes    Partners: Male    Birth control/protection: Post-menopausal    Comment: husband vasectomy

## 2023-07-30 ENCOUNTER — Other Ambulatory Visit: Payer: Self-pay

## 2023-07-30 ENCOUNTER — Encounter (HOSPITAL_COMMUNITY): Payer: Self-pay | Admitting: Orthopaedic Surgery

## 2023-07-30 ENCOUNTER — Ambulatory Visit
Admission: RE | Admit: 2023-07-30 | Discharge: 2023-07-30 | Disposition: A | Discharge status: Home / Self Care | Payer: Medicare HMO | Source: Ambulatory Visit | Attending: Orthopaedic Surgery | Admitting: Orthopaedic Surgery

## 2023-07-30 DIAGNOSIS — T84090A Other mechanical complication of internal right hip prosthesis, initial encounter: Secondary | ICD-10-CM | POA: Diagnosis not present

## 2023-07-30 DIAGNOSIS — Z96641 Presence of right artificial hip joint: Secondary | ICD-10-CM

## 2023-07-30 DIAGNOSIS — M9701XA 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, initial encounter: Secondary | ICD-10-CM

## 2023-07-30 DIAGNOSIS — S72011A Unspecified intracapsular fracture of right femur, initial encounter for closed fracture: Secondary | ICD-10-CM

## 2023-07-30 NOTE — Progress Notes (Signed)
PCP - Sigmund Hazel, MD  Cardiologist - denies  PPM/ICD - denies   Chest x-ray - denies EKG - 07/17/23 Stress Test - denies ECHO - denies Cardiac Cath - denies  CPAP - denies  DM- denies  Blood Thinner Instructions: n/a Aspirin Instructions: ASA is for after surgery. Pt did take one aspirin around 1730 on 10/23 but will not take any more doses  ERAS Protcol - clears until 0920  COVID TEST- n/a  Anesthesia review: no  Patient verbally denies any shortness of breath, fever, cough and chest pain during phone call   -------------  SDW INSTRUCTIONS given:  Your procedure is scheduled on 10/25.  Report to [facility] Main Entrance "A" at 0950 A.M., and check in at the Admitting office.  Call this number if you have problems the morning of surgery:  [phone]   Remember:  Do not eat after midnight the night before your surgery  You may drink clear liquids until 0920 the morning of your surgery.   Clear liquids allowed are: Water, Non-Citrus Juices (without pulp), Carbonated Beverages, Clear Tea, Black Coffee Only, and Gatorade    Take these medicines the morning of surgery with A SIP OF WATER  Atorvastatin Norco PRN  As of today, STOP taking any Aspirin (unless otherwise instructed by your surgeon) Aleve, Naproxen, Ibuprofen, Motrin, Advil, Goody's, BC's, all herbal medications, fish oil, and all vitamins.                      Do not wear jewelry, make up, or nail polish            Do not wear lotions, powders, perfumes/colognes, or deodorant.            Do not shave 48 hours prior to surgery.  Men may shave face and neck.            Do not bring valuables to the hospital.            [facility] is not responsible for any belongings or valuables.  Do NOT Smoke (Tobacco/Vaping) 24 hours prior to your procedure If you use a CPAP at night, you may bring all equipment for your overnight stay.   Contacts, glasses, dentures or bridgework may not be worn into surgery.       For patients admitted to the hospital, discharge time will be determined by your treatment team.   Patients discharged the day of surgery will not be allowed to drive home, and someone needs to stay with them for 24 hours.    Special instructions:   [facility]- Preparing For Surgery  Before surgery, you can play an important role. Because skin is not sterile, your skin needs to be as free of germs as possible. You can reduce the number of germs on your skin by washing with CHG (chlorahexidine gluconate) Soap before surgery.  CHG is an antiseptic cleaner which kills germs and bonds with the skin to continue killing germs even after washing.    Oral Hygiene is also important to reduce your risk of infection.  Remember - BRUSH YOUR TEETH THE MORNING OF SURGERY WITH YOUR REGULAR TOOTHPASTE  Please do not use if you have an allergy to CHG or antibacterial soaps. If your skin becomes reddened/irritated stop using the CHG.  Do not shave (including legs and underarms) for at least 48 hours prior to first CHG shower. It is OK to shave your face.  Please follow these instructions carefully.  Shower the NIGHT BEFORE SURGERY and the MORNING OF SURGERY with DIAL Soap.   Pat yourself dry with a CLEAN TOWEL.  Wear CLEAN PAJAMAS to bed the night before surgery  Place CLEAN SHEETS on your bed the night of your first shower and DO NOT SLEEP WITH PETS.   Day of Surgery: Please shower morning of surgery  Wear Clean/Comfortable clothing the morning of surgery Do not apply any deodorants/lotions.   Remember to brush your teeth WITH YOUR REGULAR TOOTHPASTE.   Questions were answered. Patient verbalized understanding of instructions.

## 2023-07-30 NOTE — Anesthesia Preprocedure Evaluation (Addendum)
Anesthesia Evaluation  Patient identified by MRN, date of birth, ID band Patient awake    Reviewed: Allergy & Precautions, NPO status , Patient's Chart, lab work & pertinent test results  History of Anesthesia Complications Negative for: history of anesthetic complications  Airway Mallampati: III  TM Distance: >3 FB Neck ROM: Full   Comment: Previous grade I view with MAC 3, easy mask Dental  (+) Dental Advisory Given   Pulmonary neg pulmonary ROS   Pulmonary exam normal breath sounds clear to auscultation       Cardiovascular hypertension (lisinopril), Pt. on medications (-) angina (-) Past MI, (-) Cardiac Stents and (-) CABG + dysrhythmias (LBBB)  Rhythm:Regular Rate:Normal  HLD   Neuro/Psych  PSYCHIATRIC DISORDERS      negative neurological ROS     GI/Hepatic negative GI ROS, Neg liver ROS,,,  Endo/Other  negative endocrine ROS    Renal/GU negative Renal ROS     Musculoskeletal  (+) Arthritis ,  Osteoporosis    Abdominal   Peds  Hematology  (+) Blood dyscrasia, anemia Lab Results      Component                Value               Date                      WBC                      10.5                07/18/2023                HGB                      9.3 (L)             07/18/2023                HCT                      28.2 (L)            07/18/2023                MCV                      93.4                07/18/2023                PLT                      165                 07/18/2023              Anesthesia Other Findings   Reproductive/Obstetrics                             Anesthesia Physical Anesthesia Plan  ASA: 2  Anesthesia Plan: General   Post-op Pain Management: Tylenol PO (pre-op)*   Induction: Intravenous  PONV Risk Score and Plan: 3 and Ondansetron, Dexamethasone and Treatment may vary due to age or medical condition  Airway Management Planned: Oral  ETT  Additional Equipment:   Intra-op Plan:  Post-operative Plan: Extubation in OR  Informed Consent: I have reviewed the patients History and Physical, chart, labs and discussed the procedure including the risks, benefits and alternatives for the proposed anesthesia with the patient or authorized representative who has indicated his/her understanding and acceptance.     Dental advisory given  Plan Discussed with: CRNA and Anesthesiologist  Anesthesia Plan Comments: (Patient prefers GA because she is worried it will be too painful to get in position for the spinal.  Risks of general anesthesia discussed including, but not limited to, sore throat, hoarse voice, chipped/damaged teeth, injury to vocal cords, nausea and vomiting, allergic reactions, lung infection, heart attack, stroke, and death. All questions answered. )       Anesthesia Quick Evaluation

## 2023-07-31 ENCOUNTER — Inpatient Hospital Stay (HOSPITAL_COMMUNITY): Payer: Medicare HMO

## 2023-07-31 ENCOUNTER — Encounter (HOSPITAL_COMMUNITY): Payer: Self-pay | Admitting: Orthopaedic Surgery

## 2023-07-31 ENCOUNTER — Inpatient Hospital Stay (HOSPITAL_COMMUNITY)
Admission: RE | Admit: 2023-07-31 | Discharge: 2023-08-03 | DRG: 467 | Disposition: A | Discharge status: Home Health | Payer: Medicare HMO | Attending: Orthopaedic Surgery | Admitting: Orthopaedic Surgery

## 2023-07-31 ENCOUNTER — Encounter (HOSPITAL_COMMUNITY)
Admission: RE | Disposition: A | Discharge status: Home Health | Payer: Self-pay | Source: Home / Self Care | Attending: Orthopaedic Surgery

## 2023-07-31 ENCOUNTER — Other Ambulatory Visit: Payer: Self-pay

## 2023-07-31 ENCOUNTER — Inpatient Hospital Stay (HOSPITAL_COMMUNITY): Payer: Medicare HMO | Admitting: Anesthesiology

## 2023-07-31 DIAGNOSIS — Z833 Family history of diabetes mellitus: Secondary | ICD-10-CM | POA: Diagnosis not present

## 2023-07-31 DIAGNOSIS — I10 Essential (primary) hypertension: Secondary | ICD-10-CM | POA: Diagnosis not present

## 2023-07-31 DIAGNOSIS — M9701XA 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, initial encounter: Secondary | ICD-10-CM | POA: Diagnosis not present

## 2023-07-31 DIAGNOSIS — D62 Acute posthemorrhagic anemia: Secondary | ICD-10-CM | POA: Diagnosis not present

## 2023-07-31 DIAGNOSIS — M810 Age-related osteoporosis without current pathological fracture: Secondary | ICD-10-CM | POA: Diagnosis present

## 2023-07-31 DIAGNOSIS — Z79899 Other long term (current) drug therapy: Secondary | ICD-10-CM

## 2023-07-31 DIAGNOSIS — E785 Hyperlipidemia, unspecified: Secondary | ICD-10-CM | POA: Diagnosis present

## 2023-07-31 DIAGNOSIS — Z800 Family history of malignant neoplasm of digestive organs: Secondary | ICD-10-CM | POA: Diagnosis not present

## 2023-07-31 DIAGNOSIS — Z8349 Family history of other endocrine, nutritional and metabolic diseases: Secondary | ICD-10-CM | POA: Diagnosis not present

## 2023-07-31 DIAGNOSIS — Z885 Allergy status to narcotic agent status: Secondary | ICD-10-CM | POA: Diagnosis not present

## 2023-07-31 DIAGNOSIS — M80051A Age-related osteoporosis with current pathological fracture, right femur, initial encounter for fracture: Principal | ICD-10-CM | POA: Diagnosis present

## 2023-07-31 DIAGNOSIS — Z8262 Family history of osteoporosis: Secondary | ICD-10-CM

## 2023-07-31 DIAGNOSIS — Z96641 Presence of right artificial hip joint: Secondary | ICD-10-CM | POA: Diagnosis present

## 2023-07-31 DIAGNOSIS — S52501A Unspecified fracture of the lower end of right radius, initial encounter for closed fracture: Secondary | ICD-10-CM

## 2023-07-31 DIAGNOSIS — S72011A Unspecified intracapsular fracture of right femur, initial encounter for closed fracture: Principal | ICD-10-CM

## 2023-07-31 DIAGNOSIS — S72141A Displaced intertrochanteric fracture of right femur, initial encounter for closed fracture: Secondary | ICD-10-CM | POA: Diagnosis present

## 2023-07-31 HISTORY — PX: TOTAL HIP REVISION: SHX763

## 2023-07-31 LAB — CBC
HCT: 31.2 % — ABNORMAL LOW (ref 36.0–46.0)
Hemoglobin: 10 g/dL — ABNORMAL LOW (ref 12.0–15.0)
MCH: 30.7 pg (ref 26.0–34.0)
MCHC: 32.1 g/dL (ref 30.0–36.0)
MCV: 95.7 fL (ref 80.0–100.0)
Platelets: 590 10*3/uL — ABNORMAL HIGH (ref 150–400)
RBC: 3.26 MIL/uL — ABNORMAL LOW (ref 3.87–5.11)
RDW: 13.8 % (ref 11.5–15.5)
WBC: 8.1 10*3/uL (ref 4.0–10.5)
nRBC: 0 % (ref 0.0–0.2)

## 2023-07-31 LAB — TYPE AND SCREEN
ABO/RH(D): A POS
Antibody Screen: NEGATIVE

## 2023-07-31 SURGERY — TOTAL HIP REVISION
Anesthesia: General | Site: Hip | Laterality: Right

## 2023-07-31 MED ORDER — SODIUM CHLORIDE 0.9% FLUSH: 10.0000 mL | Freq: Two times a day (BID) | INTRAVENOUS | Status: DC

## 2023-07-31 MED ORDER — HYDROCODONE-ACETAMINOPHEN 5-325 MG PO TABS
1.0000 | ORAL_TABLET | ORAL | Status: DC | PRN
Start: 2023-07-31 — End: 2023-08-03
  Administered 2023-08-01: 1 via ORAL
  Filled 2023-07-31 (×2): qty 1

## 2023-07-31 MED ORDER — TRANEXAMIC ACID-NACL 1000-0.7 MG/100ML-% IV SOLN
1000.0000 mg | INTRAVENOUS | Status: AC
Start: 2023-07-31 — End: 2023-07-31
  Administered 2023-07-31: 1000 mg via INTRAVENOUS
  Filled 2023-07-31: qty 100

## 2023-07-31 MED ORDER — LIDOCAINE 2% (20 MG/ML) 5 ML SYRINGE
INTRAMUSCULAR | Status: DC | PRN
  Administered 2023-07-31: 30 mg via INTRAVENOUS

## 2023-07-31 MED ORDER — PRONTOSAN WOUND IRRIGATION OPTIME
TOPICAL | Status: DC | PRN
  Administered 2023-07-31: 1 via TOPICAL

## 2023-07-31 MED ORDER — ROCURONIUM BROMIDE 10 MG/ML (PF) SYRINGE
PREFILLED_SYRINGE | INTRAVENOUS | Status: DC | PRN
  Administered 2023-07-31: 50 mg via INTRAVENOUS

## 2023-07-31 MED ORDER — ORAL CARE MOUTH RINSE: 15.0000 mL | Freq: Once | OROMUCOSAL | Status: AC

## 2023-07-31 MED ORDER — ONDANSETRON HCL 4 MG/2ML IJ SOLN: 4.0000 mg | Freq: Four times a day (QID) | INTRAMUSCULAR | Status: DC | PRN

## 2023-07-31 MED ORDER — METOCLOPRAMIDE HCL 5 MG PO TABS: 5.0000 mg | ORAL_TABLET | Freq: Three times a day (TID) | ORAL | Status: DC | PRN

## 2023-07-31 MED ORDER — DOCUSATE SODIUM 100 MG PO CAPS
100.0000 mg | ORAL_CAPSULE | Freq: Two times a day (BID) | ORAL | Status: DC
  Administered 2023-07-31 – 2023-08-03 (×6): 100 mg via ORAL
  Filled 2023-07-31 (×6): qty 1

## 2023-07-31 MED ORDER — SODIUM CHLORIDE 0.9 % IV SOLN
2000.0000 mg | INTRAVENOUS | Status: AC
  Filled 2023-07-31: qty 20

## 2023-07-31 MED ORDER — VANCOMYCIN HCL 1000 MG IV SOLR
INTRAVENOUS | Status: DC | PRN
  Administered 2023-07-31: 2000 mg via TOPICAL

## 2023-07-31 MED ORDER — TRANEXAMIC ACID 1000 MG/10ML IV SOLN
INTRAVENOUS | Status: DC | PRN
  Administered 2023-07-31: 2000 mg via TOPICAL

## 2023-07-31 MED ORDER — 0.9 % SODIUM CHLORIDE (POUR BTL) OPTIME
TOPICAL | Status: DC | PRN
  Administered 2023-07-31: 1000 mL

## 2023-07-31 MED ORDER — SODIUM CHLORIDE 0.9 % IR SOLN
Status: DC | PRN
  Administered 2023-07-31: 3000 mL

## 2023-07-31 MED ORDER — DEXAMETHASONE SODIUM PHOSPHATE 10 MG/ML IJ SOLN
INTRAMUSCULAR | Status: DC | PRN
  Administered 2023-07-31: 5 mg via INTRAVENOUS

## 2023-07-31 MED ORDER — ACETAMINOPHEN 500 MG PO TABS
500.0000 mg | ORAL_TABLET | Freq: Four times a day (QID) | ORAL | Status: AC
  Administered 2023-07-31 – 2023-08-01 (×3): 500 mg via ORAL
  Filled 2023-07-31 (×4): qty 1

## 2023-07-31 MED ORDER — METHOCARBAMOL 500 MG PO TABS
500.0000 mg | ORAL_TABLET | Freq: Four times a day (QID) | ORAL | Status: DC | PRN
  Administered 2023-07-31: 500 mg via ORAL
  Filled 2023-07-31: qty 1

## 2023-07-31 MED ORDER — ACETAMINOPHEN 325 MG PO TABS
325.0000 mg | ORAL_TABLET | Freq: Four times a day (QID) | ORAL | Status: DC | PRN
  Administered 2023-07-31 – 2023-08-02 (×3): 650 mg via ORAL
  Administered 2023-08-02: 325 mg via ORAL
  Administered 2023-08-02: 650 mg via ORAL
  Filled 2023-07-31 (×5): qty 2

## 2023-07-31 MED ORDER — ALUM & MAG HYDROXIDE-SIMETH 200-200-20 MG/5ML PO SUSP
30.0000 mL | ORAL | Status: DC | PRN
Start: 2023-07-31 — End: 2023-08-03

## 2023-07-31 MED ORDER — SUGAMMADEX SODIUM 200 MG/2ML IV SOLN
INTRAVENOUS | Status: DC | PRN
  Administered 2023-07-31: 50 mg via INTRAVENOUS
  Administered 2023-07-31: 100 mg via INTRAVENOUS

## 2023-07-31 MED ORDER — TRANEXAMIC ACID-NACL 1000-0.7 MG/100ML-% IV SOLN
1000.0000 mg | Freq: Once | INTRAVENOUS | Status: AC
  Administered 2023-07-31: 1000 mg via INTRAVENOUS
  Filled 2023-07-31: qty 100

## 2023-07-31 MED ORDER — HYDROMORPHONE HCL 1 MG/ML IJ SOLN: 0.2500 mg | INTRAMUSCULAR | Status: DC | PRN

## 2023-07-31 MED ORDER — VANCOMYCIN HCL 1000 MG IV SOLR
INTRAVENOUS | Status: AC
  Filled 2023-07-31: qty 20

## 2023-07-31 MED ORDER — ASPIRIN 81 MG PO TBEC
81.0000 mg | DELAYED_RELEASE_TABLET | Freq: Two times a day (BID) | ORAL | Status: DC
  Administered 2023-07-31 – 2023-08-03 (×6): 81 mg via ORAL
  Filled 2023-07-31 (×6): qty 1

## 2023-07-31 MED ORDER — BUPIVACAINE-MELOXICAM ER 400-12 MG/14ML IJ SOLN: INTRAMUSCULAR | Status: DC | PRN

## 2023-07-31 MED ORDER — MAGNESIUM CITRATE PO SOLN: 1.0000 | Freq: Once | ORAL | Status: DC | PRN

## 2023-07-31 MED ORDER — MORPHINE SULFATE (PF) 2 MG/ML IV SOLN: 0.5000 mg | INTRAVENOUS | Status: DC | PRN

## 2023-07-31 MED ORDER — PHENYLEPHRINE HCL-NACL 20-0.9 MG/250ML-% IV SOLN
INTRAVENOUS | Status: DC | PRN
  Administered 2023-07-31: 30 ug/min via INTRAVENOUS

## 2023-07-31 MED ORDER — LISINOPRIL 10 MG PO TABS
10.0000 mg | ORAL_TABLET | Freq: Every day | ORAL | Status: DC
  Administered 2023-07-31 – 2023-08-03 (×4): 10 mg via ORAL
  Filled 2023-07-31 (×4): qty 1

## 2023-07-31 MED ORDER — CEFAZOLIN SODIUM-DEXTROSE 2-4 GM/100ML-% IV SOLN
2.0000 g | Freq: Four times a day (QID) | INTRAVENOUS | Status: AC
  Administered 2023-07-31 – 2023-08-01 (×2): 2 g via INTRAVENOUS
  Filled 2023-07-31 (×2): qty 100

## 2023-07-31 MED ORDER — ACETAMINOPHEN 500 MG PO TABS
1000.0000 mg | ORAL_TABLET | Freq: Once | ORAL | Status: AC
  Administered 2023-07-31: 1000 mg via ORAL
  Filled 2023-07-31: qty 2

## 2023-07-31 MED ORDER — PROPOFOL 10 MG/ML IV BOLUS
INTRAVENOUS | Status: DC | PRN
  Administered 2023-07-31: 150 mg via INTRAVENOUS

## 2023-07-31 MED ORDER — MENTHOL 3 MG MT LOZG: 1.0000 | LOZENGE | OROMUCOSAL | Status: DC | PRN

## 2023-07-31 MED ORDER — PHENOL 1.4 % MT LIQD: 1.0000 | OROMUCOSAL | Status: DC | PRN

## 2023-07-31 MED ORDER — HYDROMORPHONE HCL 1 MG/ML IJ SOLN
INTRAMUSCULAR | Status: AC
  Filled 2023-07-31: qty 0.5

## 2023-07-31 MED ORDER — DEXAMETHASONE SODIUM PHOSPHATE 10 MG/ML IJ SOLN
10.0000 mg | Freq: Once | INTRAMUSCULAR | Status: AC
  Administered 2023-08-01: 10 mg via INTRAVENOUS
  Filled 2023-07-31: qty 1

## 2023-07-31 MED ORDER — DIPHENHYDRAMINE HCL 12.5 MG/5ML PO ELIX: 25.0000 mg | ORAL_SOLUTION | ORAL | Status: DC | PRN

## 2023-07-31 MED ORDER — CEFAZOLIN SODIUM-DEXTROSE 2-4 GM/100ML-% IV SOLN
2.0000 g | INTRAVENOUS | Status: AC
Start: 2023-07-31 — End: 2023-07-31
  Administered 2023-07-31: 2 g via INTRAVENOUS
  Filled 2023-07-31: qty 100

## 2023-07-31 MED ORDER — POLYETHYLENE GLYCOL 3350 17 G PO PACK: 17.0000 g | PACK | Freq: Every day | ORAL | Status: DC

## 2023-07-31 MED ORDER — POLYETHYLENE GLYCOL 3350 17 G PO PACK
17.0000 g | PACK | Freq: Every day | ORAL | Status: DC
  Filled 2023-07-31 (×3): qty 1

## 2023-07-31 MED ORDER — SORBITOL 70 % SOLN
30.0000 mL | Freq: Every day | Status: DC | PRN
Start: 2023-07-31 — End: 2023-08-03

## 2023-07-31 MED ORDER — PANTOPRAZOLE SODIUM 40 MG PO TBEC
40.0000 mg | DELAYED_RELEASE_TABLET | Freq: Every day | ORAL | Status: DC
Start: 2023-07-31 — End: 2023-08-03
  Administered 2023-07-31 – 2023-08-03 (×4): 40 mg via ORAL
  Filled 2023-07-31 (×4): qty 1

## 2023-07-31 MED ORDER — BUPIVACAINE-MELOXICAM ER 400-12 MG/14ML IJ SOLN
INTRAMUSCULAR | Status: AC
  Filled 2023-07-31: qty 1

## 2023-07-31 MED ORDER — HYDROMORPHONE HCL 1 MG/ML IJ SOLN
INTRAMUSCULAR | Status: DC | PRN
Start: 2023-07-31 — End: 2023-07-31
  Administered 2023-07-31 (×2): .25 mg via INTRAVENOUS

## 2023-07-31 MED ORDER — HYDROCODONE-ACETAMINOPHEN 7.5-325 MG PO TABS
1.0000 | ORAL_TABLET | ORAL | Status: DC | PRN
Start: 2023-07-31 — End: 2023-08-03
  Filled 2023-07-31: qty 2

## 2023-07-31 MED ORDER — CHLORHEXIDINE GLUCONATE 0.12 % MT SOLN
15.0000 mL | Freq: Once | OROMUCOSAL | Status: AC
Start: 2023-07-31 — End: 2023-07-31
  Administered 2023-07-31: 15 mL via OROMUCOSAL
  Filled 2023-07-31: qty 15

## 2023-07-31 MED ORDER — METHOCARBAMOL 1000 MG/10ML IJ SOLN: 500.0000 mg | Freq: Four times a day (QID) | INTRAMUSCULAR | Status: DC | PRN

## 2023-07-31 MED ORDER — AMISULPRIDE (ANTIEMETIC) 5 MG/2ML IV SOLN: 10.0000 mg | Freq: Once | INTRAVENOUS | Status: DC | PRN

## 2023-07-31 MED ORDER — ONDANSETRON HCL 4 MG PO TABS: 4.0000 mg | ORAL_TABLET | Freq: Four times a day (QID) | ORAL | Status: DC | PRN

## 2023-07-31 MED ORDER — FENTANYL CITRATE (PF) 250 MCG/5ML IJ SOLN
INTRAMUSCULAR | Status: DC | PRN
  Administered 2023-07-31 (×2): 50 ug via INTRAVENOUS
  Administered 2023-07-31: 150 ug via INTRAVENOUS

## 2023-07-31 MED ORDER — METOCLOPRAMIDE HCL 5 MG/ML IJ SOLN: 5.0000 mg | Freq: Three times a day (TID) | INTRAMUSCULAR | Status: DC | PRN

## 2023-07-31 MED ORDER — ONDANSETRON HCL 4 MG/2ML IJ SOLN
INTRAMUSCULAR | Status: DC | PRN
  Administered 2023-07-31: 4 mg via INTRAVENOUS

## 2023-07-31 MED ORDER — FERROUS SULFATE 325 (65 FE) MG PO TABS
325.0000 mg | ORAL_TABLET | Freq: Three times a day (TID) | ORAL | Status: DC
  Administered 2023-07-31 – 2023-08-03 (×8): 325 mg via ORAL
  Filled 2023-07-31 (×8): qty 1

## 2023-07-31 MED ORDER — LACTATED RINGERS IV SOLN: INTRAVENOUS | Status: DC | PRN

## 2023-07-31 MED ORDER — ALBUMIN HUMAN 5 % IV SOLN: INTRAVENOUS | Status: DC | PRN

## 2023-07-31 MED ORDER — SODIUM CHLORIDE 0.9 % IV SOLN: INTRAVENOUS | Status: DC

## 2023-07-31 SURGICAL SUPPLY — 92 items
ADH SKN CLS APL DERMABOND .7 (GAUZE/BANDAGES/DRESSINGS) ×1
BAG COUNTER SPONGE SURGICOUNT (BAG) ×1 IMPLANT
BAG SPNG CNTER NS LX DISP (BAG) ×1
BIT DRILL EVOS LG 2.5W/AO SHRT (DRILL) IMPLANT
BIT DRILL SURG EVOS QC 2.5 LL (DRILL) IMPLANT
BLADE CLIPPER SURG (BLADE) ×1 IMPLANT
BRUSH FEMORAL CANAL (MISCELLANEOUS) IMPLANT
CEMENT BONE SIMPLEX SPEEDSET (Cement) IMPLANT
COVER SURGICAL LIGHT HANDLE (MISCELLANEOUS) ×1 IMPLANT
DERMABOND ADVANCED .7 DNX12 (GAUZE/BANDAGES/DRESSINGS) IMPLANT
DRAPE C-ARM 42X72 X-RAY (DRAPES) IMPLANT
DRAPE C-ARMOR (DRAPES) IMPLANT
DRAPE HALF SHEET 40X57 (DRAPES) ×2 IMPLANT
DRAPE HIP W/POCKET STRL (MISCELLANEOUS) ×1 IMPLANT
DRAPE INCISE IOBAN 66X45 STRL (DRAPES) ×1 IMPLANT
DRAPE SURG ORHT 6 SPLT 77X108 (DRAPES) ×2 IMPLANT
DRAPE U-SHAPE 47X51 STRL (DRAPES) ×1 IMPLANT
DRILL EVOS LG 2.5 W/AO QC SHRT (DRILL) ×1
DRILL SURG EVOS QC 2.5 LL (DRILL) ×1
DRSG AQUACEL AG ADV 3.5X10 (GAUZE/BANDAGES/DRESSINGS) IMPLANT
DRSG AQUACEL AG ADV 3.5X14 (GAUZE/BANDAGES/DRESSINGS) IMPLANT
DURAPREP 26ML APPLICATOR (WOUND CARE) ×3 IMPLANT
ELECT BLADE 4.0 EZ CLEAN MEGAD (MISCELLANEOUS) ×1
ELECT BLADE 6.5 EXT (BLADE) IMPLANT
ELECT CAUTERY BLADE 6.4 (BLADE) IMPLANT
ELECT PENCIL ROCKER SW 15FT (MISCELLANEOUS) IMPLANT
ELECT REM PT RETURN 9FT ADLT (ELECTROSURGICAL) ×1
ELECTRODE BLDE 4.0 EZ CLN MEGD (MISCELLANEOUS) IMPLANT
ELECTRODE REM PT RTRN 9FT ADLT (ELECTROSURGICAL) ×1 IMPLANT
EVACUATOR 1/8 PVC DRAIN (DRAIN) IMPLANT
FIBERTAPE CERCLAGE TLINK SUT (SUTURE) IMPLANT
FILTER STRAW FLUID ASPIR (MISCELLANEOUS) ×1 IMPLANT
GLOVE BIOGEL PI IND STRL 7.0 (GLOVE) ×2 IMPLANT
GLOVE BIOGEL PI IND STRL 7.5 (GLOVE) ×3 IMPLANT
GLOVE ECLIPSE 6.5 STRL STRAW (GLOVE) ×2 IMPLANT
GLOVE SKINSENSE STRL SZ7.5 (GLOVE) ×1 IMPLANT
GLOVE SURG SYN 7.5 E (GLOVE) ×10 IMPLANT
GLOVE SURG SYN 7.5 PF PI (GLOVE) ×2 IMPLANT
GLOVE SURG UNDER POLY LF SZ7 (GLOVE) ×19 IMPLANT
GLOVE SURG UNDER POLY LF SZ7.5 (GLOVE) ×2 IMPLANT
GOWN TOGA ZIPPER T7+ PEEL AWAY (MISCELLANEOUS) ×2 IMPLANT
HANDPIECE INTERPULSE COAX TIP (DISPOSABLE) ×1
HEAD FEMORAL 32 CERAMIC (Hips) IMPLANT
HOOD PEEL AWAY T7 (MISCELLANEOUS) ×1 IMPLANT
K-WIRE 2X350 DRILL TIP (WIRE) ×1
KIT BASIN OR (CUSTOM PROCEDURE TRAY) ×1 IMPLANT
KIT TURNOVER KIT B (KITS) ×1 IMPLANT
KWIRE 2X350 DRILL TIP (WIRE) IMPLANT
MANIFOLD NEPTUNE II (INSTRUMENTS) ×1 IMPLANT
NDL SPNL 18GX3.5 QUINCKE PK (NEEDLE) ×1 IMPLANT
NEEDLE SPNL 18GX3.5 QUINCKE PK (NEEDLE) ×1 IMPLANT
NS IRRIG 1000ML POUR BTL (IV SOLUTION) ×1 IMPLANT
PACK TOTAL JOINT (CUSTOM PROCEDURE TRAY) ×1 IMPLANT
PAD ARMBOARD 7.5X6 YLW CONV (MISCELLANEOUS) ×2 IMPLANT
PASSER SUT SWANSON 36MM LOOP (INSTRUMENTS) ×1 IMPLANT
PLATE EVOS TROC PP 96 1H RT (Plate) IMPLANT
SCREW CORT EVOS ST 3.5X20 (Screw) IMPLANT
SCREW LOCK 30X3.5XSTSTRL (Screw) IMPLANT
SCREW LOCK EVOS BT 4.5X12 (Screw) IMPLANT
SCREW LOCK EVOS ST 3.5X16 (Screw) IMPLANT
SCREW LOCK EVOS ST 3.5X18 (Screw) IMPLANT
SCREW LOCK ST EVOS 3.5 X 26 (Screw) IMPLANT
SCREW LOCK ST EVOS 3.5X22 (Screw) IMPLANT
SCREW LOCK ST EVOS 3.5X38 (Screw) IMPLANT
SCREW LOCKING 3.5X30 (Screw) ×1 IMPLANT
SET HNDPC FAN SPRY TIP SCT (DISPOSABLE) IMPLANT
SOLUTION IRRIG SURGIPHOR (IV SOLUTION) IMPLANT
SOLUTION PRONTOSAN WOUND 350ML (IRRIGATION / IRRIGATOR) IMPLANT
SPONGE T-LAP 18X18 ~~LOC~~+RFID (SPONGE) IMPLANT
STAPLER VISISTAT 35W (STAPLE) IMPLANT
STEM FEM CMTLS STD RECLAIM 13 (Stem) IMPLANT
SUT ETHIBOND NAB CT1 #1 30IN (SUTURE) ×1 IMPLANT
SUT ETHILON 2 0 PSLX (SUTURE) IMPLANT
SUT PDS AB 0 CT 36 (SUTURE) ×1 IMPLANT
SUT PDS AB 1 CT 36 (SUTURE) ×1 IMPLANT
SUT VIC AB 0 CT1 27 (SUTURE) ×1
SUT VIC AB 0 CT1 27XBRD ANBCTR (SUTURE) IMPLANT
SUT VIC AB 1 CT1 27 (SUTURE) ×2
SUT VIC AB 1 CT1 27XBRD ANTBC (SUTURE) ×2 IMPLANT
SUT VIC AB 2-0 CT1 27 (SUTURE) ×2
SUT VIC AB 2-0 CT1 TAPERPNT 27 (SUTURE) ×2 IMPLANT
SUT VIC AB CT1 27XBRD ANBCTRL (SUTURE) ×1
SYR 50ML LL SCALE MARK (SYRINGE) ×1 IMPLANT
SYR TB 1ML LUER SLIP (SYRINGE) ×1 IMPLANT
TOWEL GREEN STERILE (TOWEL DISPOSABLE) ×1 IMPLANT
TOWEL GREEN STERILE FF (TOWEL DISPOSABLE) ×1 IMPLANT
TOWER CARTRIDGE SMART MIX (DISPOSABLE) IMPLANT
TRAY FOL W/BAG SLVR 16FR STRL (SET/KITS/TRAYS/PACK) IMPLANT
TRAY FOLEY W/BAG SLVR 16FR LF (SET/KITS/TRAYS/PACK)
TUBE SUCT ARGYLE STRL (TUBING) ×1 IMPLANT
WATER STERILE IRR 1000ML POUR (IV SOLUTION) ×3 IMPLANT
YANKAUER SUCT BULB TIP NO VENT (SUCTIONS) IMPLANT

## 2023-07-31 NOTE — Op Note (Addendum)
OPEN REDUCTION INTERNAL FIXATION AND REVISION OF RIGHT PERIPROSTHETIC HIP  Procedure Note Jovanne Higginbottom   [id number]  Pre-op Diagnosis: Right periprosthetic hip fracture type B2     Post-op Diagnosis: same   Operative Procedures  Revision of right femoral stem Open reduction internal fixation of right periprosthetic intertrochanteric fracture  Operative findings: Loss of fixation of femoral stem with subsidence Minimally displaced greater trochanter and lesser trochanter fractures Intact acetabular component and poly liner Good bone stock of the femoral shaft  Surgeon: Gershon Mussel, M.D.  Assistants: April Green, RNFA   Anesthesia: general  Implants: Depuy Reclaim monoblock 15 STD; 32 mm +1 ceramic head Smith and Nephew periprosthetic trochanteric ring plate 1 hole Arthrex cerclage fibertape   Explants: Depuy Actis 4 STD; 32 mm +1 ceramic head  Date of Service: 07/31/2023  Indication: 72 y.o. year old femalemale with a periprosthetic hip fracture that was indicated for revision and ORIF, the patient elected to proceed after informed consent was obtained.  Procedure:  After informed consent was obtained and understanding of the risk were voiced including but not limited to bleeding, infection, damage to surrounding structures including nerves and vessels, blood clots, leg length inequality, dislocation and the failure to achieve desired results, the operative extremity was marked with verbal confirmation of the patient in the holding area.   The patient was then brought to the operating room and transported to the operating room table and placed in the left lateral decubitus position. The operative limb was then prepped and draped in the usual sterile fashion and preoperative antibiotics were administered.  A time out was performed prior to the start of surgery confirming the correct extremity, preoperative antibiotic administration, as well as team members, implants and  instruments available for the case. Correct surgical site was also confirmed with preoperative radiographs. A separate incision was made over the lateral aspect of the hip and a standard posterior approach to the hip was performed.  The IT band was sharply incised in line with the incision.  The gluteus maximus muscle was split bluntly.  The posterior capsule and short external rotators and piriformis were elevated off of the proximal femur as 1 piece.  Charnley retractor was placed deep to the IT band.  The sciatic nerve was found in its expected anatomic location and protected.  Fracture hematoma was encountered and evacuated.  The hip prosthesis was then dislocated without any difficulty.  The femoral head was tamped off of the trunnion.  The existing stem had subsided and was resting on the calcar.  The stem was back slapped out without any difficulty.  The cup and the polyethylene liner were not damaged.  Inspection of the fracture showed that it was mainly a large posterior greater trochanteric fragment with a separate lesser trochanter fracture that extended to just to the superior border of the gluteus maximus insertion.  Fortunately the fracture was minimally displaced and easily reduced with ligamentotaxis.  Because of this I decided to revise the stem first.  A small portion of the vastus lateralis was elevated off of the proximal femur in order for me to place a fiber tape cerclage distal to the fractures to prevent propagation during reaming.  The leg was placed into internal rotation with the foot facing directly up at the ceiling.  Sequential reaming was performed to a 15 stem with excellent chatter.  This was checked under fluoroscopy.  We then trialed a standard neck with a +5 head ball.  This felt too  tight during reduction and I felt that this made her leg too long.  We trialed with a +1 head ball which I felt best restored her leg length.  There was no shuck or impingement of the prosthesis.   Clinical leg lengths were equivalent.  Tip of the greater trochanter was level with the center of the femoral head.  It was stable to 90 degrees of flexion and greater than 70 degrees of internal rotation.  In the sleeper position it was also stable to greater than 70 degrees of internal rotation.  Combined anteversion was approximately 40 degrees.  The prosthesis was then dislocated and the trial components were removed.  Thorough irrigation was performed with pulsatile lavage.  Final implants were then impacted.  We then turned our attention to ORIF of the fracture.  I used a 1 hole trochanteric ring plate on the lateral aspect of the greater trochanter.  I felt that this fit her anatomy very well.  Multiple locking screws were used to secure the plate to the greater and lesser trochanter fractures.  This was all checked under fluoroscopy.  I was very happy with the fixation.  Rotation of the hip showed no movement of the fractures.  Dilute betadine irrigation was placed in the surgical bed for 3 minutes and then thoroughly irrigated with normal saline with pulsatile lavage.  1 g of vancomycin powder was placed in the joint.  The vastus lateralis was repaired with #1 Vicryl.  The IT band was closed with #1 Vicryl.  Another gram of vancomycin powder was placed in the subcutaneous tissue.  0 Vicryl was used for the deep fat layer, and 2.0 Vicryl Plus for the subcutaneous tissue. The skin was closed with staples.  A sterile dressing was applied. The patient was awakened and transported to the recovery room in stable condition. All sponge, needle, and instrument counts were correct at the end of the case.  Position: lateral decubitus   Complications: none.  Time Out: performed   Drains/Packing: None  Estimated blood loss: 150 cc  Returned to Recovery Room: in good condition.   Antibiotics: yes   Mechanical VTE (DVT) Prophylaxis: sequential compression devices, TED thigh-high  Chemical VTE (DVT)  Prophylaxis: lovenox  Fluid Replacement  Crystalloid: See anesthesia record Blood: none  FFP: none   Specimens Removed: 1 to pathology   Sponge and Instrument Count Correct? yes   PACU: portable radiograph - low AP pelvis  Admission: inpatient status, start PT & OT POD#1  Plan/RTC: Return in 2 weeks for wound check.  Weight Bearing/Load Lower Extremity: 50% partial weightbearing Posterior hip precautions  N. Glee Arvin, MD [facility] 9:59 AM

## 2023-07-31 NOTE — Anesthesia Postprocedure Evaluation (Signed)
Anesthesia Post Note  Patient: Cheryl Glass  Procedure(s) Performed: OPEN REDUCTION INTERNAL FIXATION AND REVISION OF RIGHT PERIPROSTHETIC HIP (Right: Hip)     Patient location during evaluation: PACU Anesthesia Type: General Level of consciousness: awake Pain management: pain level controlled Vital Signs Assessment: post-procedure vital signs reviewed and stable Respiratory status: spontaneous breathing, nonlabored ventilation and respiratory function stable Cardiovascular status: blood pressure returned to baseline and stable Postop Assessment: no apparent nausea or vomiting Anesthetic complications: no   No notable events documented.  Last Vitals:  Vitals:   07/31/23 1600 07/31/23 1615  BP: (!) 161/79 138/84  Pulse:  71  Resp:  17  Temp:    SpO2: 99% 97%    Last Pain:  Vitals:   07/31/23 1615  TempSrc:   PainSc: 0-No pain        RLE Motor Response: Purposeful movement (07/31/23 1615) RLE Sensation: Full sensation (07/31/23 1615)      Linton Rump

## 2023-07-31 NOTE — Plan of Care (Signed)

## 2023-07-31 NOTE — Anesthesia Procedure Notes (Signed)
Procedure Name: Intubation Date/Time: 07/31/2023 12:18 PM  Performed by: Gwenyth Allegra, CRNAPre-anesthesia Checklist: Patient identified, Emergency Drugs available, Suction available, Patient being monitored and Timeout performed Patient Re-evaluated:Patient Re-evaluated prior to induction Oxygen Delivery Method: Circle system utilized Preoxygenation: Pre-oxygenation with 100% oxygen Induction Type: IV induction Ventilation: Mask ventilation without difficulty and Oral airway inserted - appropriate to patient size Laryngoscope Size: Mac and 3 Grade View: Grade II Tube type: Oral Tube size: 7.0 mm Number of attempts: 1 Airway Equipment and Method: Stylet Placement Confirmation: ETT inserted through vocal cords under direct vision, CO2 detector and breath sounds checked- equal and bilateral Secured at: 21 cm Tube secured with: Tape Dental Injury: Teeth and Oropharynx as per pre-operative assessment

## 2023-07-31 NOTE — Transfer of Care (Signed)
Immediate Anesthesia Transfer of Care Note  Patient: Cheryl Glass  Procedure(s) Performed: OPEN REDUCTION INTERNAL FIXATION AND REVISION OF RIGHT PERIPROSTHETIC HIP (Right: Hip)  Patient Location: PACU  Anesthesia Type:General  Level of Consciousness: awake  Airway & Oxygen Therapy: Patient Spontanous Breathing  Post-op Assessment: Report given to RN and Post -op Vital signs reviewed and stable  Post vital signs: Reviewed and stable  Last Vitals:  Vitals Value Taken Time  BP 148/81 (98)   Temp    Pulse 74   Resp 17   SpO2 95     Last Pain:  Vitals:   07/31/23 1007  TempSrc:   PainSc: 3          Complications: No notable events documented.

## 2023-07-31 NOTE — H&P (Signed)
PREOPERATIVE H&P  Chief Complaint: right periprosthetic hip fracture  HPI: Cheryl Glass is a 72 y.o. female who presents for surgical treatment of right periprosthetic hip fracture.  She denies any changes in medical history.  Past Surgical History:  Procedure Laterality Date   AUGMENTATION MAMMAPLASTY     BREAST ENHANCEMENT SURGERY     CESAREAN SECTION     EXAM UNDER ANESTHESIA WITH MANIPULATION OF KNEE Right 06/06/2020   Procedure: EXAM UNDER ANESTHESIA WITH MANIPULATION OF KNEE;  Surgeon: Eldred Manges, MD;  Location: [facility];  Service: Orthopedics;  Laterality: Right;   HARDWARE REMOVAL Right 06/06/2020   Procedure: right knee wire removal;  Surgeon: Eldred Manges, MD;  Location: [facility];  Service: Orthopedics;  Laterality: Right;   HYSTEROSCOPY     OPEN REDUCTION INTERNAL FIXATION (ORIF) DISTAL RADIAL FRACTURE Right 07/16/2023   Procedure: OPEN REDUCTION INTERNAL FIXATION (ORIF) DISTAL RADIUS FRACTURE;  Surgeon: Tarry Kos, MD;  Location: [facility];  Service: Orthopedics;  Laterality: Right;   ORIF PATELLA Right 04/20/2020   Procedure: OPEN REDUCTION INTERNAL (ORIF) FIXATION RIGHT PATELLA;  Surgeon: Eldred Manges, MD;  Location: [facility];  Service: Orthopedics;  Laterality: Right;   TOTAL HIP ARTHROPLASTY Right 07/16/2023   Procedure: TOTAL HIP ARTHROPLASTY ANTERIOR APPROACH;  Surgeon: Tarry Kos, MD;  Location: [facility];  Service: Orthopedics;  Laterality: Right;   Social History   Socioeconomic History   Marital status: Married    Spouse name: Not on file   Number of children: Not on file   Years of education: Not on file   Highest education level: Not on file  Occupational History   Not on file  Tobacco Use   Smoking status: Never   Smokeless tobacco: Never  Vaping Use   Vaping status: Never Used  Substance and Sexual Activity   Alcohol use: No    Alcohol/week: 0.0 standard drinks of alcohol   Drug use: No   Sexual activity: Yes     Partners: Male    Birth control/protection: Post-menopausal    Comment: husband vasectomy  Other Topics Concern   Not on file  Social History Narrative   Not on file   Social Determinants of Health   Financial Resource Strain: Not on file  Food Insecurity: Not on file  Transportation Needs: Not on file  Physical Activity: Not on file  Stress: Not on file  Social Connections: Not on file   Family History  Problem Relation Age of Onset   Cancer Father        pancreatic   Diabetes Father    Thyroid disease Sister        hypothyroid   Osteoporosis Maternal Grandmother    Breast cancer Neg Hx    Allergies  Allergen Reactions   Oxycodone Itching   Prior to Admission medications   Medication Sig Start Date End Date Taking? Authorizing Provider  aspirin EC 81 MG tablet Take 1 tablet (81 mg total) by mouth 2 (two) times daily. To be taken after surgery to prevent blood clots 07/17/23 07/16/24  Cristie Hem, PA-C  atorvastatin (LIPITOR) 10 MG tablet Take 10 mg by mouth daily. 04/21/15   [provider]  HYDROcodone-acetaminophen (NORCO) 7.5-325 MG tablet Take 1-2 tablets by mouth every 6 (six) hours as needed for moderate pain. 07/17/23   Cristie Hem, PA-C  lisinopril (PRINIVIL,ZESTRIL) 10 MG tablet Take 10 mg by mouth daily.    [provider]  Pediatric Multiple Vitamins (CHEWABLE MULTIPLE VITAMINS PO) Take 2 tablets by mouth daily.    [provider]     Positive ROS: All other systems have been reviewed and were otherwise negative with the exception of those mentioned in the HPI and as above.  Physical Exam: General: Alert, no acute distress Cardiovascular: No pedal edema Respiratory: No cyanosis, no use of accessory musculature GI: abdomen soft Skin: No lesions in the area of chief complaint Neurologic: Sensation intact distally Psychiatric: Patient is competent for consent with normal mood and affect Lymphatic: no  lymphedema  MUSCULOSKELETAL: exam stable  Assessment: right periprosthetic hip fracture  Plan: Plan for Procedure(s): OPEN REDUCTION INTERNAL FIXATION AND REVISION OF RIGHT PERIPROSTHETIC HIP  The risks benefits and alternatives were discussed with the patient including but not limited to the risks of nonoperative treatment, versus surgical intervention including infection, bleeding, nerve injury,  blood clots, cardiopulmonary complications, morbidity, mortality, among others, and they were willing to proceed.   Glee Arvin, MD 07/31/2023 9:54 AM

## 2023-08-01 ENCOUNTER — Encounter (HOSPITAL_BASED_OUTPATIENT_CLINIC_OR_DEPARTMENT_OTHER): Payer: Medicare HMO | Admitting: Rehabilitative and Restorative Service Providers"

## 2023-08-01 LAB — CBC
HCT: 25.3 % — ABNORMAL LOW (ref 36.0–46.0)
Hemoglobin: 8.2 g/dL — ABNORMAL LOW (ref 12.0–15.0)
MCH: 31.4 pg (ref 26.0–34.0)
MCHC: 32.4 g/dL (ref 30.0–36.0)
MCV: 96.9 fL (ref 80.0–100.0)
Platelets: 560 10*3/uL — ABNORMAL HIGH (ref 150–400)
RBC: 2.61 MIL/uL — ABNORMAL LOW (ref 3.87–5.11)
RDW: 13.8 % (ref 11.5–15.5)
WBC: 11.9 10*3/uL — ABNORMAL HIGH (ref 4.0–10.5)
nRBC: 0.2 % (ref 0.0–0.2)

## 2023-08-01 LAB — BASIC METABOLIC PANEL
Anion gap: 13 (ref 5–15)
BUN: 11 mg/dL (ref 8–23)
CO2: 15 mmol/L — ABNORMAL LOW (ref 22–32)
Calcium: 8.6 mg/dL — ABNORMAL LOW (ref 8.9–10.3)
Chloride: 109 mmol/L (ref 98–111)
Creatinine, Ser: 0.89 mg/dL (ref 0.44–1.00)
GFR, Estimated: >60 mL/min (ref >60)
Glucose, Bld: 146 mg/dL — ABNORMAL HIGH (ref 70–99)
Potassium: 4.7 mmol/L (ref 3.5–5.1)
Sodium: 137 mmol/L (ref 135–145)

## 2023-08-01 NOTE — Progress Notes (Signed)
   Subjective:  Patient reports pain as moderate.  Reporting more soreness. No events.  Objective:   VITALS:   Vitals:   07/31/23 1700 07/31/23 2317 08/01/23 0417 08/01/23 0852  BP: (!) 146/60 129/84 137/71 (!) 112/51  Pulse: 76  87 80  Resp: 18  17 18   Temp: 98.4 F (36.9 C) 98.3 F (36.8 C) 99.9 F (37.7 C) 98.1 F (36.7 C)  TempSrc: Oral Oral Oral   SpO2: 95%  99% 100%  Weight:   51.9 kg   Height:        Sensation intact distally Intact pulses distally Dorsiflexion/Plantar flexion intact Incision: dressing C/D/I and no drainage   Lab Results  Component Value Date   WBC 11.9 (H) 08/01/2023   HGB 8.2 (L) 08/01/2023   HCT 25.3 (L) 08/01/2023   MCV 96.9 08/01/2023   PLT 560 (H) 08/01/2023     Assessment/Plan:  1 Day Post-Op   - Expected postop acute blood loss anemia - Up with PT/OT - DVT ppx - SCDs, ambulation, aspirin 81 mg BID - 50% PWB RLE; platform WB RUE  - Pain control - plan to keep this weekend and d/c home Monday - will place CM consult for HHPT  Glee Arvin 08/01/2023, 10:24 AM

## 2023-08-01 NOTE — Plan of Care (Signed)
  Problem: Clinical Measurements: Goal: Will remain free from infection Outcome: Progressing Goal: Diagnostic test results will improve Outcome: Progressing Goal: Respiratory complications will improve Outcome: Progressing Goal: Cardiovascular complication will be avoided Outcome: Progressing   Problem: Activity: Goal: Risk for activity intolerance will decrease Outcome: Progressing   Problem: Nutrition: Goal: Adequate nutrition will be maintained Outcome: Progressing   Problem: Coping: Goal: Level of anxiety will decrease Outcome: Progressing   Problem: Pain Management: Goal: General experience of comfort will improve Outcome: Progressing   Problem: Safety: Goal: Ability to remain free from injury will improve Outcome: Progressing

## 2023-08-01 NOTE — Evaluation (Signed)
Physical Therapy Evaluation Patient Details Name: Cheryl Glass MRN: [id number] DOB: [birth year] Today's Date: 08/01/2023  History of Present Illness  The pt is a 72 yo female presenting 10/25 for ORIF of R periprosthetic hip fx. Pt originally admitted 10/10-10/12 after a fall with R femoral neck and L distal radius fx, s/p R THA and ORIF of L distal radius on 10/10. PMH includes: arthritis, HLD, HTN, osteoporosis, R patella fx, R wrist fx.   Clinical Impression  Pt in bed upon arrival of PT, agreeable to evaluation at this time. Prior to admission the pt was using platform RW in the home or [facility] prior to surgery, reports she had self-restricted mobility to portion of the home to improve safety and improve independence. The pt is eager to improve mobility and recovery, able to complete sit-stand transfer with minA, and complete 18 ft ambulation in the room with use of platform RW. She had no instances of R knee buckling, but does still have slight decrease in DF. Pt will be safe to return home with all DME, recommend continued rehab at home to improve safety with mobility and initial strengthening.      If plan is discharge home, recommend the following: Assistance with cooking/housework;Assist for transportation;Help with stairs or ramp for entrance;A little help with walking and/or transfers;A little help with bathing/dressing/bathroom   Can travel by private vehicle        Equipment Recommendations None recommended by PT  Recommendations for Other Services       Functional Status Assessment Patient has had a recent decline in their functional status and demonstrates the ability to make significant improvements in function in a reasonable and predictable amount of time.     Precautions / Restrictions Precautions Precautions: Fall;Posterior Hip Precaution Booklet Issued: Yes (comment) Restrictions Weight Bearing Restrictions: Yes RUE Weight Bearing: Weight bear through elbow only RLE  Weight Bearing: Partial weight bearing RLE Partial Weight Bearing Percentage or Pounds: 50%      Mobility  Bed Mobility Overal bed mobility: Needs Assistance             General bed mobility comments: OOB in recliner    Transfers Overall transfer level: Needs assistance Equipment used: Right platform walker Transfers: Sit to/from Stand Sit to Stand: Min assist           General transfer comment: minA to power up, cues for L hand use, and min-modA to control decent to chair.    Ambulation/Gait Ambulation/Gait assistance: Min assist Gait Distance (Feet): 18 Feet Assistive device: Right platform walker Gait Pattern/deviations: Decreased dorsiflexion - right, Drifts right/left, Step-through pattern Gait velocity: decreased Gait velocity interpretation: <1.31 ft/sec, indicative of household ambulator   General Gait Details: pt with slow but steady progresison, small step-to gait withgood adherence to [facility] RLE. No overt buckling or LOB, limited by fatigue     Balance Overall balance assessment: Needs assistance Sitting-balance support: No upper extremity supported, Feet supported Sitting balance-Leahy Scale: Good     Standing balance support: Bilateral upper extremity supported Standing balance-Leahy Scale: Poor Standing balance comment: reliant on R Platform RW for safety                             Pertinent Vitals/Pain Pain Assessment Pain Assessment: 0-10 Pain Score: 3  Pain Location: surgical site Pain Descriptors / Indicators: Sore Pain Intervention(s): Limited activity within patient's tolerance, Monitored during session, Repositioned, RN gave pain meds during session  Home Living Family/patient expects to be discharged to:: Private residence Living Arrangements: Spouse/significant other Available Help at Discharge: Family;Available 24 hours/day Type of Home: House Home Access: Stairs to enter Entrance Stairs-Rails: None (or threshold  step into sunroom) Entrance Stairs-Number of Steps: 2   Home Layout: One level Home Equipment: Shower seat;Cane - Programmer, applications (2 wheels);Hand held shower head;Wheelchair - Engineer, technical sales - power      Prior Function Prior Level of Function : Independent/Modified Independent;Driving;Other (comment) (exercises daily)             Mobility Comments: admitted for falls, no other falls in last 6 months ADLs Comments: assist and supervision from spouse since last admission     Extremity/Trunk Assessment   Upper Extremity Assessment Upper Extremity Assessment: Defer to OT evaluation RUE Deficits / Details: RUE in cast, pt able to move at shoulder against gravity    Lower Extremity Assessment Lower Extremity Assessment: RLE deficits/detail;LLE deficits/detail RLE Deficits / Details: grossly 4/5 to MMT of ankle and partial ROM at knee and hip due to pain but pt able to initiate all movement. Of note, pt with genu valgus and slight knee internal rotation on R only RLE: Unable to fully assess due to pain RLE Sensation: WNL RLE Coordination: WNL LLE Deficits / Details: grossly 4/5 to MMT LLE Sensation: WNL LLE Coordination: WNL    Cervical / Trunk Assessment Cervical / Trunk Assessment: Normal  Communication   Communication Communication: No apparent difficulties Cueing Techniques: Verbal cues  Cognition Arousal: Alert Behavior During Therapy: WFL for tasks assessed/performed Overall Cognitive Status: Within Functional Limits for tasks assessed                                 General Comments: able to follow single step commands well, min safety cues initially for transfers and gait due to pt veering to her L side in hallway        General Comments General comments (skin integrity, edema, etc.): pt reports dizziness after mobility        Assessment/Plan    PT Assessment Patient needs continued PT services  PT Problem List Decreased  strength;Decreased activity tolerance;Decreased range of motion;Decreased balance;Decreased mobility;Decreased coordination;Decreased safety awareness       PT Treatment Interventions DME instruction;Gait training;Stair training;Functional mobility training;Therapeutic activities;Therapeutic exercise;Neuromuscular re-education;Balance training;Patient/family education    PT Goals (Current goals can be found in the Care Plan section)  Acute Rehab PT Goals Patient Stated Goal: return to independence and exercise PT Goal Formulation: With patient Time For Goal Achievement: 08/15/23 Potential to Achieve Goals: Good    Frequency Min 1X/week        AM-PAC PT "6 Clicks" Mobility  Outcome Measure Help needed turning from your back to your side while in a flat bed without using bedrails?: A Little Help needed moving from lying on your back to sitting on the side of a flat bed without using bedrails?: A Little Help needed moving to and from a bed to a chair (including a wheelchair)?: A Little Help needed standing up from a chair using your arms (e.g., wheelchair or bedside chair)?: A Little Help needed to walk in hospital room?: A Little Help needed climbing 3-5 steps with a railing? : A Little 6 Click Score: 18    End of Session Equipment Utilized During Treatment: Gait belt Activity Tolerance: Patient tolerated treatment well Patient left: in chair;with call bell/phone within reach;with family/visitor present;with  nursing/sitter in room Nurse Communication: Mobility status PT Visit Diagnosis: Unsteadiness on feet (R26.81);Other abnormalities of gait and mobility (R26.89);Muscle weakness (generalized) (M62.81);Difficulty in walking, not elsewhere classified (R26.2)    Time: 7035-0093 PT Time Calculation (min) (ACUTE ONLY): 49 min   Charges:   PT Evaluation $PT Eval Low Complexity: 1 Low PT Treatments $Gait Training: 8-22 mins $Therapeutic Activity: 8-22 mins PT General Charges $$  ACUTE PT VISIT: 1 Visit         Vickki Muff, PT, DPT   Acute Rehabilitation Department Office [phone] Secure Chat Communication Preferred  Ronnie Derby 08/01/2023, 1:59 PM

## 2023-08-01 NOTE — Progress Notes (Signed)
OT Cancellation Note  Patient Details Name: Cheryl Glass MRN: [id number] DOB: [birth year]   Cancelled Treatment:    Reason Eval/Treat Not Completed: Patient declined, no reason specified Pt reports she just got back to recliner from commode. Requesting OT return tomorrow. OT will continue to follow and return as time allows and pt is appropriate.   Providence Crosby 08/01/2023, 3:38 PM

## 2023-08-02 LAB — HEMOGLOBIN AND HEMATOCRIT, BLOOD
HCT: 25.6 % — ABNORMAL LOW (ref 36.0–46.0)
Hemoglobin: 8.2 g/dL — ABNORMAL LOW (ref 12.0–15.0)

## 2023-08-02 NOTE — Progress Notes (Signed)
Subjective: 2 Days Post-Op Procedure(s) (LRB): OPEN REDUCTION INTERNAL FIXATION AND REVISION OF RIGHT PERIPROSTHETIC HIP (Right) Patient reports pain as mild.  Doing well this am.  Ready for PT  Objective: Vital signs in last 24 hours: Temp:  [98.7 F (37.1 C)-99.3 F (37.4 C)] 99.3 F (37.4 C) (10/27 0725) Pulse Rate:  [80-84] 84 (10/27 0531) Resp:  [17-18] 17 (10/27 0725) BP: (132-140)/(58-71) 140/63 (10/27 0725) SpO2:  [99 %-100 %] 100 % (10/27 0725)  Intake/Output from previous day: 10/26 0701 - 10/27 0700 In: 240 [P.O.:240] Out: -  Intake/Output this shift: Total I/O In: 240 [P.O.:240] Out: -   Recent Labs    07/31/23 1101 08/01/23 0842  HGB 10.0* 8.2*   Recent Labs    07/31/23 1101 08/01/23 0842  WBC 8.1 11.9*  RBC 3.26* 2.61*  HCT 31.2* 25.3*  PLT 590* 560*   Recent Labs    08/01/23 0842  NA 137  K 4.7  CL 109  CO2 15*  BUN 11  CREATININE 0.89  GLUCOSE 146*  CALCIUM 8.6*   No results for input(s): "LABPT", "INR" in the last 72 hours.  Neurologically intact Neurovascular intact Sensation intact distally Intact pulses distally Dorsiflexion/Plantar flexion intact Incision: dressing C/D/I No cellulitis present Compartment soft   Assessment/Plan: 2 Days Post-Op Procedure(s) (LRB): OPEN REDUCTION INTERNAL FIXATION AND REVISION OF RIGHT PERIPROSTHETIC HIP (Right) Advance diet Up with therapy D/C IV fluids RUE- platform weight bearing with walker RLE- 50% weight bearing Will order new H&H today as hemoglobin 8.2 yesterday Plan to d/c home with hhpt tomorrow Taking tylenol ES for pain control- has at home ASA 81 mg bid for dvt ppx- has at home      Cristie Hem 08/02/2023, 11:39 AM

## 2023-08-02 NOTE — TOC Initial Note (Signed)
Transition of Care [facility]) - Initial/Assessment Note    Patient Details  Name: Cheryl Glass MRN: [id number] Date of Birth: [birth year]  [facility]) CM/SW Contact:    Ronny Bacon, RN Phone Number: 08/02/2023, 3:24 PM  Clinical Narrative:    Home health PT recommendations noted and arranged through [facility] with [facility]. Contact information placed on AVS.               Expected Discharge Plan: Home w Home Health Services Barriers to Discharge: Continued Medical Work up   Patient Goals and CMS Choice            Expected Discharge Plan and Services                                   HH Arranged: PT [facility] Agency: [facility] Date [facility] Agency Contacted: 08/02/23 Time HH Agency Contacted: 1523 Representative spoke with at [facility] Agency: Kandee Keen  Prior Living Arrangements/Services                       Activities of Daily Living   ADL Screening (condition at time of admission) Independently performs ADLs?: No Is the patient deaf or have difficulty hearing?: No Does the patient have difficulty seeing, even when wearing glasses/contacts?: No Does the patient have difficulty concentrating, remembering, or making decisions?: No  Permission Sought/Granted                  Emotional Assessment              Admission diagnosis:  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] Patient Active Problem List   Diagnosis Date Noted   History of revision of total replacement of right hip joint 07/31/2023   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 [place] [address] Phone: [phone] Fax: [phone]     Social Determinants of Health (SDOH) Social History: SDOH Screenings   Tobacco Use: Low Risk  (07/31/2023)   SDOH Interventions:     Readmission Risk Interventions     No data to display

## 2023-08-02 NOTE — Discharge Instructions (Signed)

## 2023-08-02 NOTE — Progress Notes (Signed)
Physical Therapy Treatment Patient Details Name: Cheryl Glass MRN: [id number] DOB: 1951-01-24 Today's Date: 08/02/2023   History of Present Illness The pt is a 72 yo female presenting 10/25 for ORIF of R periprosthetic hip fx. Pt originally admitted 10/10-10/12 after a fall with R femoral neck and L distal radius fx, s/p R THA and ORIF of L distal radius on 10/10. PMH includes: arthritis, HLD, HTN, osteoporosis, R patella fx, R wrist fx.    PT Comments  The pt was able to make great progress with ambulation distance and independence today (largely moving at [facility] level). However, mobility progression limited by onset of slight dizziness and fatigue reported by pt, it resolved with seated rest and when BP was taken in sitting it was 102/64 (75). However, after return to room and BP taken in standing, it was 63/45 (52) with HR of 116bpm. Pt again recovered well in sitting. Will need continued skilled PT acutely to progress mobility safely and to improve both stability and endurance prior to return home.   VITALS:  - sitting after 45 ft ambulation - BP: 102/64 (75); HR: 80bpm - standing after 45 ft ambulation - BP: 63/45 (52); HR: 116bpm - sitting - BP: 109/58 (74); HR: 98bpm    If plan is discharge home, recommend the following: Assistance with cooking/housework;Assist for transportation;Help with stairs or ramp for entrance;A little help with walking and/or transfers;A little help with bathing/dressing/bathroom   Can travel by private vehicle        Equipment Recommendations  None recommended by PT    Recommendations for Other Services       Precautions / Restrictions Precautions Precautions: Fall;Posterior Hip Precaution Booklet Issued: Yes (comment) Precaution Comments: discussed verbally Restrictions Weight Bearing Restrictions: Yes RUE Weight Bearing: Weight bear through elbow only RLE Weight Bearing: Partial weight bearing RLE Partial Weight Bearing Percentage or Pounds: 50%      Mobility  Bed Mobility Overal bed mobility: Needs Assistance             General bed mobility comments: OOB in recliner    Transfers Overall transfer level: Needs assistance Equipment used: Right platform walker Transfers: Sit to/from Stand Sit to Stand: Min assist, Contact guard assist           General transfer comment: minA from low surface without armrests, CGA from recliner    Ambulation/Gait Ambulation/Gait assistance: Min assist, Contact guard assist Gait Distance (Feet): 45 Feet (+ 23ft) Assistive device: Right platform walker Gait Pattern/deviations: Decreased dorsiflexion - right, Drifts right/left, Step-through pattern Gait velocity: decreased     General Gait Details: pt with slow but steady progresison, small step-to gait withgood adherence to PWB RLE. No overt buckling or LOB, limited by fatigue. pt also reports dizziness and clammy-ness after walking noted to be orthostatic. no feeling faint or near syncope   Stairs             Wheelchair Mobility     Tilt Bed    Modified Rankin (Stroke Patients Only)       Balance Overall balance assessment: Needs assistance Sitting-balance support: No upper extremity supported, Feet supported Sitting balance-Leahy Scale: Good     Standing balance support: Bilateral upper extremity supported Standing balance-Leahy Scale: Poor Standing balance comment: reliant on R Platform RW for safety                            Cognition Arousal: Alert Behavior During Therapy: [facility] for  tasks assessed/performed Overall Cognitive Status: Within Functional Limits for tasks assessed                                 General Comments: able to follow single step commands well, min safety cues initially for transfers and gait due to pt veering to her L side in hallway        Exercises      General Comments General comments (skin integrity, edema, etc.): orthostatic. BP to low of  63/45 (52) after ambulation      Pertinent Vitals/Pain Pain Assessment Pain Assessment: 0-10 Pain Score: 3  Pain Location: surgical site Pain Descriptors / Indicators: Sore Pain Intervention(s): Limited activity within patient's tolerance, Monitored during session, Repositioned, Patient requesting pain meds-RN notified    Home Living                          Prior Function            PT Goals (current goals can now be found in the care plan section) Acute Rehab PT Goals Patient Stated Goal: return to independence and exercise PT Goal Formulation: With patient Time For Goal Achievement: 08/15/23 Potential to Achieve Goals: Good Progress towards PT goals: Progressing toward goals    Frequency    Min 1X/week      PT Plan      Co-evaluation              AM-PAC PT "6 Clicks" Mobility   Outcome Measure  Help needed turning from your back to your side while in a flat bed without using bedrails?: A Little Help needed moving from lying on your back to sitting on the side of a flat bed without using bedrails?: A Little Help needed moving to and from a bed to a chair (including a wheelchair)?: A Little Help needed standing up from a chair using your arms (e.g., wheelchair or bedside chair)?: A Little Help needed to walk in hospital room?: A Little Help needed climbing 3-5 steps with a railing? : A Little 6 Click Score: 18    End of Session Equipment Utilized During Treatment: Gait belt Activity Tolerance: Patient tolerated treatment well Patient left: in chair;with call bell/phone within reach;with family/visitor present;with nursing/sitter in room Nurse Communication: Mobility status (orthostatic) PT Visit Diagnosis: Unsteadiness on feet (R26.81);Other abnormalities of gait and mobility (R26.89);Muscle weakness (generalized) (M62.81);Difficulty in walking, not elsewhere classified (R26.2)     Time: 4132-4401 PT Time Calculation (min) (ACUTE ONLY): 25  min  Charges:    $Gait Training: 8-22 mins $Therapeutic Exercise: 8-22 mins PT General Charges $$ ACUTE PT VISIT: 1 Visit                     3   Ronnie Derby 08/02/2023, 2:07 PM

## 2023-08-03 ENCOUNTER — Telehealth: Payer: Self-pay | Admitting: Orthopaedic Surgery

## 2023-08-03 NOTE — Plan of Care (Signed)
  Problem: Education: Goal: Knowledge of General Education information will improve Description: Including pain rating scale, medication(s)/side effects and non-pharmacologic comfort measures Outcome: Adequate for Discharge   Problem: Health Behavior/Discharge Planning: Goal: Ability to manage health-related needs will improve Outcome: Adequate for Discharge   Problem: Activity: Goal: Risk for activity intolerance will decrease Outcome: Adequate for Discharge   Problem: Nutrition: Goal: Adequate nutrition will be maintained Outcome: Adequate for Discharge   Problem: Coping: Goal: Level of anxiety will decrease Outcome: Adequate for Discharge   Problem: Elimination: Goal: Will not experience complications related to bowel motility Outcome: Adequate for Discharge   Problem: Pain Management: Goal: General experience of comfort will improve Outcome: Adequate for Discharge

## 2023-08-03 NOTE — Progress Notes (Signed)
Discharge instructions given. Patient verbalized understanding and all questions were answered.  ?

## 2023-08-03 NOTE — Telephone Encounter (Signed)
Pt states Home health has not reached out to her yet Roda Shutters advised she let us know if they have not reached out by the time we closed please advise

## 2023-08-03 NOTE — Progress Notes (Signed)
   Subjective:  Patient reports pain as mild.  Had orthostasis yesterday but has resolved today.    Objective:   VITALS:   Vitals:   08/02/23 1518 08/02/23 2149 08/03/23 0555 08/03/23 0849  BP: (!) 120/59 (!) 123/59 131/70 139/66  Pulse: 91 88 (!) 101 82  Resp: 16 19 18 18   Temp: 98 F (36.7 C) 98.4 F (36.9 C) 98.1 F (36.7 C) 98.9 F (37.2 C)  TempSrc: Oral Oral Oral Oral  SpO2: 100% 98% 100% 100%  Weight:      Height:        Exam of right hip is stable.  Dressing intact.   Lab Results  Component Value Date   WBC 11.9 (H) 08/01/2023   HGB 8.2 (L) 08/02/2023   HCT 25.6 (L) 08/02/2023   MCV 96.9 08/01/2023   PLT 560 (H) 08/01/2023     Assessment/Plan:  3 Days Post-Op   - Expected postop acute blood loss anemia - doing well this morning and wants to go home - orthostasis has resolved - f/u 1 week for recheck in office  Glee Arvin 08/03/2023, 9:08 AM

## 2023-08-03 NOTE — Telephone Encounter (Signed)
Spoke with patient. Sent [facility] another email about scheduling patient. Patient has been made aware and I gave her the number for [facility] as well.

## 2023-08-03 NOTE — Discharge Summary (Signed)
Patient ID: Cheryl Glass MRN: [id number] DOB/AGE: 1950/11/24 72 y.o.  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    She was given perioperative antibiotics:  Anti-infectives (From admission, onward)    Start     Dose/Rate Route Frequency Ordered Stop   07/31/23 1900  ceFAZolin (ANCEF) IVPB 2g/100 mL premix        2 g 200 mL/hr over 30 Minutes Intravenous Every 6 hours 07/31/23 1643 08/01/23 0326   07/31/23 1358  vancomycin (VANCOCIN) powder  Status:  Discontinued          As needed 07/31/23 1358 07/31/23 1551   07/31/23 1000  ceFAZolin (ANCEF) IVPB 2g/100 mL premix        2 g 200 mL/hr over 30 Minutes Intravenous On call to O.R. 07/31/23 0945 07/31/23 1300     .  She was given sequential compression devices, early ambulation, and appropriate chemoprophylaxis for DVT prophylaxis.  She benefited maximally from the hospital stay and there were no complications.    Recent vital signs:  Vitals:   08/03/23 0555 08/03/23 0849  BP: 131/70 139/66  Pulse: (!)  101 82  Resp: 18 18  Temp: 98.1 F (36.7 C) 98.9 F (37.2 C)  SpO2: 100% 100%    Recent laboratory studies:  Lab Results  Component Value Date   HGB 8.2 (L) 08/02/2023   HGB 8.2 (L) 08/01/2023   HGB 10.0 (L) 07/31/2023   Lab Results  Component Value Date   WBC 11.9 (H) 08/01/2023   PLT 560 (H) 08/01/2023   No results found for: "INR" Lab Results  Component Value Date   NA 137 08/01/2023   K 4.7 08/01/2023   CL 109 08/01/2023   CO2 15 (L) 08/01/2023   BUN 11 08/01/2023   CREATININE 0.89 08/01/2023   GLUCOSE 146 (H) 08/01/2023    Discharge Medications:   Allergies as of 08/03/2023       Reactions   Oxycodone Itching        Medication List     TAKE these medications    aspirin EC 81 MG tablet Take 1 tablet (81 mg total) by mouth 2 (two) times daily. To be taken after surgery to prevent blood clots   atorvastatin 10 MG tablet Commonly known as: LIPITOR Take 10 mg by mouth daily.   CHEWABLE MULTIPLE VITAMINS PO Take 2 tablets by mouth daily.   HYDROcodone-acetaminophen 7.5-325 MG tablet Commonly known as: Norco Take 1-2 tablets by mouth every 6 (six) hours as needed for moderate pain.   lisinopril 10 MG tablet Commonly known as: ZESTRIL Take  10 mg by mouth daily.               Durable Medical Equipment  (From admission, onward)           Start     Ordered   07/31/23 1643  DME Walker rolling  Once       Question:  Patient needs a walker to treat with the following condition  Answer:  History of hip replacement   07/31/23 1643   07/31/23 1643  DME 3 n 1  Once        07/31/23 1643   07/31/23 1643  DME Bedside commode  Once       Question:  Patient needs a bedside commode to treat with the following condition  Answer:  History of hip replacement   07/31/23 1643            Diagnostic Studies: DG Pelvis Portable  Result Date: 07/31/2023 CLINICAL DATA:  Postop right hip revision EXAM: PORTABLE PELVIS 1-2 VIEWS COMPARISON:   07/28/2023 FINDINGS: Interval revision of the right hip replacement and placement of lateral plate and screw fixation device in the proximal femoral shaft and greater trochanter. Normal AP alignment. No hardware bony complicating feature. IMPRESSION: Revision and internal fixation as above. No visible complicating feature. Electronically Signed   By: Charlett Nose M.D.   On: 07/31/2023 19:15   DG HIP UNILAT WITH PELVIS 2-3 VIEWS RIGHT  Result Date: 07/31/2023 CLINICAL DATA:  ORIF periprosthetic hip fracture EXAM: DG HIP (WITH OR WITHOUT PELVIS) 2-3V RIGHT COMPARISON:  07/28/2023 FINDINGS: Multiple intraoperative spot images demonstrate revision of the right hip replacement and placement of lateral plate and screw fixation device in the region of the greater trochanter and proximal shaft. No hardware bony complicating feature. Normal AP alignment. IMPRESSION: Internal fixation and revision of hip replacement as above. Electronically Signed   By: Charlett Nose M.D.   On: 07/31/2023 19:14   DG C-Arm 1-60 Min-No Report  Result Date: 07/31/2023 Fluoroscopy was utilized by the requesting physician.  No radiographic interpretation.   DG C-Arm 1-60 Min-No Report  Result Date: 07/31/2023 Fluoroscopy was utilized by the requesting physician.  No radiographic interpretation.   CT HIP RIGHT WO CONTRAST  Result Date: 07/30/2023 CLINICAL DATA:  Right hip fracture with total hip arthroplasty 07/16/2023. Failed arthroplasty. Preoperative assessment for planned revision. EXAM: CT OF THE RIGHT HIP WITHOUT CONTRAST TECHNIQUE: Multidetector CT imaging of the right hip was performed according to the standard protocol. Multiplanar CT image reconstructions were also generated. RADIATION DOSE REDUCTION: This exam was performed according to the departmental dose-optimization program which includes automated exposure control, adjustment of the mA and/or kV according to patient size and/or use of iterative reconstruction  technique. COMPARISON:  Radiographs 07/28/2023 and 07/16/2023. FINDINGS: Bones/Joint/Cartilage Status post right total hip arthroplasty. Screw fixed acetabular component is well positioned, without loosening. The femoral stem is well positioned, without loosening. However, there is a mildly displaced and mildly comminuted intertrochanteric fracture with mild sub trochanteric extension posteriorly. No dislocation, bone destruction or significant hip joint effusion identified. The right sacroiliac joint is intact. Ligaments Suboptimally assessed by CT. Muscles and Tendons The visualized right pelvic and hip muscles appear unremarkable. The gluteus minimus tendon inserts on the avulsed fracture of the greater tuberosity. Soft tissues Lenticular-shaped, low-density fluid collection lateral to the proximal femur lies deep to the iliotibial band, measuring approximately 4.5 x 1.8 cm transverse and extending approximately 6.7 cm craniocaudal. Expected postsurgical changes more superficially in the  subcutaneous tissues without suspicious fluid collection or unexpected foreign body. IMPRESSION: 1. Status post right total hip arthroplasty with mildly displaced and mildly comminuted intertrochanteric fracture. 2. The femoral stem and acetabular component are well positioned, without loosening. 3. Lenticular-shaped, low-density fluid collection lateral to the proximal femur, likely a postoperative seroma. Electronically Signed   By: Carey Bullocks M.D.   On: 07/30/2023 10:24   XR HIP UNILAT W OR W/O PELVIS 2-3 VIEWS RIGHT  Result Date: 07/28/2023 X-rays of the right hip demonstrates a status post right total hip replacement.  There is a periprosthetic fracture of the greater trochanter into the lesser trochanter with subsidence of the femoral component.  Shortening of the leg is present.  Acetabular component with associated screws uncomplicated.  XR Wrist Complete Right  Result Date: 07/28/2023 X-rays of the right  wrist demonstrate volar locking plate with associated screws.  Alignment of the fracture is unchanged.  There is no hardware complications.  There has not been any interval collapse of the fracture fragments.  DG HIP UNILAT WITH PELVIS 2-3 VIEWS RIGHT  Result Date: 07/16/2023 CLINICAL DATA:  Elective surgery. EXAM: DG HIP (WITH OR WITHOUT PELVIS) 2-3V RIGHT COMPARISON:  Radiograph earlier today FINDINGS: Five fluoroscopic spot views of the pelvis and right hip obtained in the operating room. Sequential images during hip arthroplasty. Fluoroscopy time 31 seconds. Dose 3.06 mGy. IMPRESSION: Intraoperative fluoroscopy during right hip arthroplasty. Electronically Signed   By: Narda Rutherford M.D.   On: 07/16/2023 22:04   DG C-Arm 1-60 Min-No Report  Result Date: 07/16/2023 Fluoroscopy was utilized by the requesting physician.  No radiographic interpretation.   DG C-Arm 1-60 Min-No Report  Result Date: 07/16/2023 Fluoroscopy was utilized by the requesting physician.  No radiographic interpretation.   DG Pelvis 1-2 Views  Result Date: 07/16/2023 CLINICAL DATA:  Right hip pain following fall. EXAM: PELVIS - 1-2 VIEW COMPARISON:  None Available. FINDINGS: There is a comminuted fracture involving the right femoral neck with foreshortening. No additional fractures are identified. No radiopaque foreign body. IMPRESSION: Comminuted fracture involving the right femoral neck with foreshortening. Further evaluation with dedicated right hip radiographs could be performed as indicated. Electronically Signed   By: Simonne Come M.D.   On: 07/16/2023 10:29   DG Forearm Right  Result Date: 07/16/2023 CLINICAL DATA:  Post fall, now with right wrist deformity EXAM: RIGHT FOREARM - 2 VIEW COMPARISON:  Right wrist radiographs-earlier same day FINDINGS: Redemonstrated comminuted, impacted fracture of the distal radius with intra-articular extension. Redemonstrated minimally displaced ulnar styloid process fracture.  No additional fractures are identified. Limited visualization of the adjacent elbow is normal given obliquity and field of view. No elbow joint effusion. Expected adjacent soft tissue swelling.  No radiopaque foreign body. IMPRESSION: 1. Redemonstrated comminuted, impacted fracture of the distal radius and ulnar styloid process fractures, better demonstrated on dedicated right wrist radiographs performed earlier same day. 2. No additional fractures identified. Electronically Signed   By: Simonne Come M.D.   On: 07/16/2023 10:26   DG Wrist Complete Right  Result Date: 07/16/2023 CLINICAL DATA:  Post fall, now with right wrist deformity. EXAM: RIGHT WRIST - COMPLETE 3+ VIEW COMPARISON:  None Available. FINDINGS: There is a severely comminuted and impacted fracture of the distal radius, apex dorsal with intra-articular extension. Additionally, there is a minimally displaced fracture of the ulnar styloid process. The proximal carpal row remains aligned with the displaced distal radial fracture fragment. Remaining joint spaces appear preserved. Expected adjacent soft tissue swelling.  No radiopaque foreign body. IMPRESSION: 1. Severely comminuted and impacted fracture of the distal radius, apex dorsal, with intra-articular extension. 2. Minimally displaced fracture of the ulnar styloid process. Electronically Signed   By: Simonne Come M.D.   On: 07/16/2023 10:23    Disposition: Discharge disposition: 01-Home or Self Care       Discharge Instructions     Call MD / Call 911   Complete by: As directed    If you experience chest pain or shortness of breath, CALL 911 and be transported to the hospital emergency room.  If you develope a fever above 101.5 F, pus (white drainage) or increased drainage or redness at the wound, or calf pain, call your surgeon's office.   Constipation Prevention   Complete by: As directed    Drink plenty of fluids.  Prune juice may be helpful.  You may use a stool softener, such  as Colace (over the counter) 100 mg twice a day.  Use MiraLax (over the counter) for constipation as needed.   Driving restrictions   Complete by: As directed    No driving while taking narcotic pain meds.   Increase activity slowly as tolerated   Complete by: As directed    Post-operative opioid taper instructions:   Complete by: As directed    POST-OPERATIVE OPIOID TAPER INSTRUCTIONS: It is important to wean off of your opioid medication as soon as possible. If you do not need pain medication after your surgery it is ok to stop day one. Opioids include: Codeine, Hydrocodone(Norco, Vicodin), Oxycodone(Percocet, oxycontin) and hydromorphone amongst others.  Long term and even short term use of opiods can cause: Increased pain response Dependence Constipation Depression Respiratory depression And more.  Withdrawal symptoms can include Flu like symptoms Nausea, vomiting And more Techniques to manage these symptoms Hydrate well Eat regular healthy meals Stay active Use relaxation techniques(deep breathing, meditating, yoga) Do Not substitute Alcohol to help with tapering If you have been on opioids for less than two weeks and do not have pain than it is ok to stop all together.  Plan to wean off of opioids This plan should start within one week post op of your joint replacement. Maintain the same interval or time between taking each dose and first decrease the dose.  Cut the total daily intake of opioids by one tablet each day Next start to increase the time between doses. The last dose that should be eliminated is the evening dose.           Follow-up Information     [facility] Follow up.   Specialty: Home Health Services Why: physical therapy. Office will call to arrange follow up after discharge. Contact information: [address] [place] [address] [phone]         Tarry Kos, MD Follow up in 1 week(s).   Specialty:  Orthopedic Surgery Why: recheck of hip and wrist Contact information: [address] [place] [address] [phone]                  Signed: Glee Arvin 08/03/2023, 9:11 AM

## 2023-08-03 NOTE — Plan of Care (Signed)
  Problem: Education: Goal: Knowledge of General Education information will improve Description: Including pain rating scale, medication(s)/side effects and non-pharmacologic comfort measures Outcome: Progressing   Problem: Coping: Goal: Level of anxiety will decrease Outcome: Progressing   Problem: Pain Management: Goal: General experience of comfort will improve Outcome: Progressing   Problem: Safety: Goal: Ability to remain free from injury will improve Outcome: Progressing   Problem: Skin Integrity: Goal: Risk for impaired skin integrity will decrease Outcome: Progressing   Problem: Education: Goal: Knowledge of the prescribed therapeutic regimen will improve Outcome: Progressing

## 2023-08-04 ENCOUNTER — Encounter (HOSPITAL_COMMUNITY): Payer: Self-pay | Admitting: Orthopaedic Surgery

## 2023-08-05 ENCOUNTER — Telehealth: Payer: Self-pay | Admitting: Orthopaedic Surgery

## 2023-08-05 DIAGNOSIS — M810 Age-related osteoporosis without current pathological fracture: Secondary | ICD-10-CM | POA: Diagnosis not present

## 2023-08-05 DIAGNOSIS — F509 Eating disorder, unspecified: Secondary | ICD-10-CM | POA: Diagnosis not present

## 2023-08-05 DIAGNOSIS — S52591D Other fractures of lower end of right radius, subsequent encounter for closed fracture with routine healing: Secondary | ICD-10-CM | POA: Diagnosis not present

## 2023-08-05 DIAGNOSIS — Z7982 Long term (current) use of aspirin: Secondary | ICD-10-CM | POA: Diagnosis not present

## 2023-08-05 DIAGNOSIS — E785 Hyperlipidemia, unspecified: Secondary | ICD-10-CM | POA: Diagnosis not present

## 2023-08-05 DIAGNOSIS — Z96641 Presence of right artificial hip joint: Secondary | ICD-10-CM | POA: Diagnosis not present

## 2023-08-05 DIAGNOSIS — S52601D Unspecified fracture of lower end of right ulna, subsequent encounter for closed fracture with routine healing: Secondary | ICD-10-CM | POA: Diagnosis not present

## 2023-08-05 DIAGNOSIS — I10 Essential (primary) hypertension: Secondary | ICD-10-CM | POA: Diagnosis not present

## 2023-08-05 DIAGNOSIS — M9701XD 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, subsequent encounter: Secondary | ICD-10-CM | POA: Diagnosis not present

## 2023-08-05 NOTE — Telephone Encounter (Signed)
[facility] rehab protocol 50% partial WB right lower extremity Platform WB right upper extremity

## 2023-08-05 NOTE — Telephone Encounter (Signed)
Charri from [employer] needs order for PT for 2wk 2, and OOT evaluation. Also did she needs to do any exercises being she had two surgeries? Just needed to know what she can and can not do. CB#[phone]

## 2023-08-05 NOTE — Telephone Encounter (Signed)
Called and gave verbal on voicemail.

## 2023-08-07 ENCOUNTER — Telehealth: Payer: Self-pay | Admitting: Orthopaedic Surgery

## 2023-08-07 DIAGNOSIS — Z96641 Presence of right artificial hip joint: Secondary | ICD-10-CM | POA: Diagnosis not present

## 2023-08-07 DIAGNOSIS — S52601D Unspecified fracture of lower end of right ulna, subsequent encounter for closed fracture with routine healing: Secondary | ICD-10-CM | POA: Diagnosis not present

## 2023-08-07 DIAGNOSIS — S52591D Other fractures of lower end of right radius, subsequent encounter for closed fracture with routine healing: Secondary | ICD-10-CM | POA: Diagnosis not present

## 2023-08-07 DIAGNOSIS — F509 Eating disorder, unspecified: Secondary | ICD-10-CM | POA: Diagnosis not present

## 2023-08-07 DIAGNOSIS — I10 Essential (primary) hypertension: Secondary | ICD-10-CM | POA: Diagnosis not present

## 2023-08-07 DIAGNOSIS — M810 Age-related osteoporosis without current pathological fracture: Secondary | ICD-10-CM | POA: Diagnosis not present

## 2023-08-07 DIAGNOSIS — E785 Hyperlipidemia, unspecified: Secondary | ICD-10-CM | POA: Diagnosis not present

## 2023-08-07 DIAGNOSIS — M9701XD 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, subsequent encounter: Secondary | ICD-10-CM | POA: Diagnosis not present

## 2023-08-07 DIAGNOSIS — Z7982 Long term (current) use of aspirin: Secondary | ICD-10-CM | POA: Diagnosis not present

## 2023-08-07 NOTE — Telephone Encounter (Signed)
Called and gave verbal 

## 2023-08-07 NOTE — Telephone Encounter (Signed)
Received call from Will (OT) with [facility] advised patient did not want him to come out today for eval. Will asked if he can get an auth to move eval to next week. The number to contact Will is [phone]

## 2023-08-10 DIAGNOSIS — Z96641 Presence of right artificial hip joint: Secondary | ICD-10-CM | POA: Diagnosis not present

## 2023-08-10 DIAGNOSIS — F509 Eating disorder, unspecified: Secondary | ICD-10-CM | POA: Diagnosis not present

## 2023-08-10 DIAGNOSIS — S52601D Unspecified fracture of lower end of right ulna, subsequent encounter for closed fracture with routine healing: Secondary | ICD-10-CM | POA: Diagnosis not present

## 2023-08-10 DIAGNOSIS — E785 Hyperlipidemia, unspecified: Secondary | ICD-10-CM | POA: Diagnosis not present

## 2023-08-10 DIAGNOSIS — M810 Age-related osteoporosis without current pathological fracture: Secondary | ICD-10-CM | POA: Diagnosis not present

## 2023-08-10 DIAGNOSIS — Z7982 Long term (current) use of aspirin: Secondary | ICD-10-CM | POA: Diagnosis not present

## 2023-08-10 DIAGNOSIS — M9701XD 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, subsequent encounter: Secondary | ICD-10-CM | POA: Diagnosis not present

## 2023-08-10 DIAGNOSIS — S52591D Other fractures of lower end of right radius, subsequent encounter for closed fracture with routine healing: Secondary | ICD-10-CM | POA: Diagnosis not present

## 2023-08-10 DIAGNOSIS — I10 Essential (primary) hypertension: Secondary | ICD-10-CM | POA: Diagnosis not present

## 2023-08-11 ENCOUNTER — Ambulatory Visit (INDEPENDENT_AMBULATORY_CARE_PROVIDER_SITE_OTHER): Payer: Medicare HMO | Admitting: Physician Assistant

## 2023-08-11 ENCOUNTER — Telehealth: Payer: Self-pay | Admitting: Orthopaedic Surgery

## 2023-08-11 ENCOUNTER — Other Ambulatory Visit: Payer: Self-pay

## 2023-08-11 DIAGNOSIS — M9701XD 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, subsequent encounter: Secondary | ICD-10-CM | POA: Diagnosis not present

## 2023-08-11 DIAGNOSIS — S52601A Unspecified fracture of lower end of right ulna, initial encounter for closed fracture: Secondary | ICD-10-CM

## 2023-08-11 DIAGNOSIS — Z7982 Long term (current) use of aspirin: Secondary | ICD-10-CM | POA: Diagnosis not present

## 2023-08-11 DIAGNOSIS — Z96641 Presence of right artificial hip joint: Secondary | ICD-10-CM | POA: Diagnosis not present

## 2023-08-11 DIAGNOSIS — S52601D Unspecified fracture of lower end of right ulna, subsequent encounter for closed fracture with routine healing: Secondary | ICD-10-CM | POA: Diagnosis not present

## 2023-08-11 DIAGNOSIS — M810 Age-related osteoporosis without current pathological fracture: Secondary | ICD-10-CM | POA: Diagnosis not present

## 2023-08-11 DIAGNOSIS — S52591D Other fractures of lower end of right radius, subsequent encounter for closed fracture with routine healing: Secondary | ICD-10-CM | POA: Diagnosis not present

## 2023-08-11 DIAGNOSIS — S52501A Unspecified fracture of the lower end of right radius, initial encounter for closed fracture: Secondary | ICD-10-CM

## 2023-08-11 DIAGNOSIS — I10 Essential (primary) hypertension: Secondary | ICD-10-CM | POA: Diagnosis not present

## 2023-08-11 DIAGNOSIS — E785 Hyperlipidemia, unspecified: Secondary | ICD-10-CM | POA: Diagnosis not present

## 2023-08-11 DIAGNOSIS — F509 Eating disorder, unspecified: Secondary | ICD-10-CM | POA: Diagnosis not present

## 2023-08-11 NOTE — Telephone Encounter (Signed)
Will (OT) from [facility] called for verbal orders2 wk 3, any restriction for right arm range of motion orweight barring. Will secure phone number is [phone].

## 2023-08-11 NOTE — Progress Notes (Signed)
Post-Op Visit Note   Patient: Cheryl Glass           Date of Birth: 1951/08/08           MRN: [id number] Visit Date: 08/11/2023 PCP: Sigmund Hazel, MD   Assessment & Plan:  Chief Complaint:  Chief Complaint  Patient presents with   Right Hip - Routine Post Op   Visit Diagnoses:  1. Status post total hip replacement, right   2. Closed fracture of right distal radius and ulna, initial encounter     Plan: Patient is a pleasant 72 year old female who comes in today 4 weeks status post ORIF right distal radius fracture 07/16/2023 and 2 weeks status post right hip ORIF periprosthetic fracture and hip revision, date of surgery 07/31/2023.  In regards to the right hip, she has been compliant nonweightbearing.  She has a minimal to no pain.  She is taking Tylenol as needed.  In regards to the right wrist, she has been doing well.  Minimal to no pain.  Tylenol is helping.  She has been taking a baby aspirin twice daily for DVT prophylaxis.  Examination of the right hip: Well-healed surgical incision with staples intact.  No evidence of infection or cellulitis.  She has a small seroma.  No skin changes.  No tenderness.  She is neurovascularly intact distally.  Impression is 4 weeks status post ORIF right distal radius fracture 2 weeks status post ORIF right hip periprosthetic fracture and revision total hip arthroplasty.  In regards to the wrist, we have placed her in a short arm cast for the next 2 weeks.  Follow-up in 2 weeks for repeat evaluation and x-rays.  In regards to the hip, we will allow her to weight-bear as tolerated.  Will have her follow-up in 2 weeks for repeat evaluation and x-rays of the right hip.  She may platform weight-bear to the right upper extremity.  Call with concerns or questions.  Continue taking a baby aspirin twice daily until 6 weeks postop.  Call with concerns or questions.  Follow-Up Instructions: Return in about 2 weeks (around 08/25/2023).   Orders:  Orders  Placed This Encounter  Procedures   XR HIP UNILAT W OR W/O PELVIS 2-3 VIEWS RIGHT   XR Wrist Complete Right   No orders of the defined types were placed in this encounter.   Imaging: No results found.  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) 07/30/2023   Closed subcapital fracture of neck of right femur, initial encounter (HCC) 07/16/2023   Closed fracture of right distal radius and ulna, initial encounter 07/16/2023   Past Medical History:  Diagnosis Date   Arthritis    Eating disorder    Hyperlipidemia    Hypertension    Osteoporosis    Right patella fracture    Wrist fracture 2013   right    Family History  Problem Relation Age of Onset   Cancer Father        pancreatic   Diabetes Father    Thyroid disease Sister        hypothyroid   Osteoporosis Maternal Grandmother    Breast cancer Neg Hx     Past Surgical History:  Procedure Laterality Date   AUGMENTATION MAMMAPLASTY     BREAST ENHANCEMENT SURGERY     CESAREAN SECTION  EXAM UNDER ANESTHESIA WITH MANIPULATION OF KNEE Right 06/06/2020   Procedure: EXAM UNDER ANESTHESIA WITH MANIPULATION OF KNEE;  Surgeon: Eldred Manges, MD;  Location: [facility];  Service: Orthopedics;  Laterality: Right;   HARDWARE REMOVAL Right 06/06/2020   Procedure: right knee wire removal;  Surgeon: Eldred Manges, MD;  Location: [facility];  Service: Orthopedics;  Laterality: Right;   HYSTEROSCOPY     OPEN REDUCTION INTERNAL FIXATION (ORIF) DISTAL RADIAL FRACTURE Right 07/16/2023   Procedure: OPEN REDUCTION INTERNAL FIXATION (ORIF) DISTAL RADIUS FRACTURE;  Surgeon: Tarry Kos, MD;  Location: [facility];  Service: Orthopedics;  Laterality: Right;   ORIF PATELLA Right 04/20/2020   Procedure: OPEN REDUCTION INTERNAL (ORIF) FIXATION RIGHT PATELLA;   Surgeon: Eldred Manges, MD;  Location: [facility];  Service: Orthopedics;  Laterality: Right;   TOTAL HIP ARTHROPLASTY Right 07/16/2023   Procedure: TOTAL HIP ARTHROPLASTY ANTERIOR APPROACH;  Surgeon: Tarry Kos, MD;  Location: [facility];  Service: Orthopedics;  Laterality: Right;   TOTAL HIP REVISION Right 07/31/2023   Procedure: OPEN REDUCTION INTERNAL FIXATION AND REVISION OF RIGHT PERIPROSTHETIC HIP;  Surgeon: Tarry Kos, MD;  Location: [facility];  Service: Orthopedics;  Laterality: Right;   Social History   Occupational History   Not on file  Tobacco Use   Smoking status: Never   Smokeless tobacco: Never  Vaping Use   Vaping status: Never Used  Substance and Sexual Activity   Alcohol use: No    Alcohol/week: 0.0 standard drinks of alcohol   Drug use: No   Sexual activity: Yes    Partners: Male    Birth control/protection: Post-menopausal    Comment: husband vasectomy

## 2023-08-12 NOTE — Telephone Encounter (Signed)
Called and gave verbal 

## 2023-08-12 NOTE — Telephone Encounter (Signed)
Platform WB on the arm.  Approve on PT

## 2023-08-13 ENCOUNTER — Ambulatory Visit (HOSPITAL_BASED_OUTPATIENT_CLINIC_OR_DEPARTMENT_OTHER): Payer: Medicare HMO | Admitting: Physical Therapy

## 2023-08-13 DIAGNOSIS — S52591D Other fractures of lower end of right radius, subsequent encounter for closed fracture with routine healing: Secondary | ICD-10-CM | POA: Diagnosis not present

## 2023-08-13 DIAGNOSIS — I10 Essential (primary) hypertension: Secondary | ICD-10-CM | POA: Diagnosis not present

## 2023-08-13 DIAGNOSIS — F509 Eating disorder, unspecified: Secondary | ICD-10-CM | POA: Diagnosis not present

## 2023-08-13 DIAGNOSIS — S52601D Unspecified fracture of lower end of right ulna, subsequent encounter for closed fracture with routine healing: Secondary | ICD-10-CM | POA: Diagnosis not present

## 2023-08-13 DIAGNOSIS — M810 Age-related osteoporosis without current pathological fracture: Secondary | ICD-10-CM | POA: Diagnosis not present

## 2023-08-13 DIAGNOSIS — M9701XD 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, subsequent encounter: Secondary | ICD-10-CM | POA: Diagnosis not present

## 2023-08-13 DIAGNOSIS — Z96641 Presence of right artificial hip joint: Secondary | ICD-10-CM | POA: Diagnosis not present

## 2023-08-13 DIAGNOSIS — Z7982 Long term (current) use of aspirin: Secondary | ICD-10-CM | POA: Diagnosis not present

## 2023-08-13 DIAGNOSIS — E785 Hyperlipidemia, unspecified: Secondary | ICD-10-CM | POA: Diagnosis not present

## 2023-08-14 ENCOUNTER — Telehealth: Payer: Self-pay | Admitting: Orthopaedic Surgery

## 2023-08-14 NOTE — Telephone Encounter (Signed)
Received call from [facility] (PT) with [facility] needing verbal orders for HHPT 2 Wk 2. She wanted to know if there is an orders for Exercise,precautions and Restrictions?  The number to contact Emiliano Dyer is [phone]

## 2023-08-14 NOTE — Telephone Encounter (Signed)
Called and gave verbal. WBAT on leg. Platform WB with arm.

## 2023-08-17 DIAGNOSIS — S52601D Unspecified fracture of lower end of right ulna, subsequent encounter for closed fracture with routine healing: Secondary | ICD-10-CM | POA: Diagnosis not present

## 2023-08-17 DIAGNOSIS — E785 Hyperlipidemia, unspecified: Secondary | ICD-10-CM | POA: Diagnosis not present

## 2023-08-17 DIAGNOSIS — M9701XD 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, subsequent encounter: Secondary | ICD-10-CM | POA: Diagnosis not present

## 2023-08-17 DIAGNOSIS — Z7982 Long term (current) use of aspirin: Secondary | ICD-10-CM | POA: Diagnosis not present

## 2023-08-17 DIAGNOSIS — Z96641 Presence of right artificial hip joint: Secondary | ICD-10-CM | POA: Diagnosis not present

## 2023-08-17 DIAGNOSIS — M810 Age-related osteoporosis without current pathological fracture: Secondary | ICD-10-CM | POA: Diagnosis not present

## 2023-08-17 DIAGNOSIS — S52591D Other fractures of lower end of right radius, subsequent encounter for closed fracture with routine healing: Secondary | ICD-10-CM | POA: Diagnosis not present

## 2023-08-17 DIAGNOSIS — I10 Essential (primary) hypertension: Secondary | ICD-10-CM | POA: Diagnosis not present

## 2023-08-17 DIAGNOSIS — F509 Eating disorder, unspecified: Secondary | ICD-10-CM | POA: Diagnosis not present

## 2023-08-18 DIAGNOSIS — E785 Hyperlipidemia, unspecified: Secondary | ICD-10-CM | POA: Diagnosis not present

## 2023-08-18 DIAGNOSIS — I10 Essential (primary) hypertension: Secondary | ICD-10-CM | POA: Diagnosis not present

## 2023-08-18 DIAGNOSIS — S52601D Unspecified fracture of lower end of right ulna, subsequent encounter for closed fracture with routine healing: Secondary | ICD-10-CM | POA: Diagnosis not present

## 2023-08-18 DIAGNOSIS — Z7982 Long term (current) use of aspirin: Secondary | ICD-10-CM | POA: Diagnosis not present

## 2023-08-18 DIAGNOSIS — Z96641 Presence of right artificial hip joint: Secondary | ICD-10-CM | POA: Diagnosis not present

## 2023-08-18 DIAGNOSIS — S52591D Other fractures of lower end of right radius, subsequent encounter for closed fracture with routine healing: Secondary | ICD-10-CM | POA: Diagnosis not present

## 2023-08-18 DIAGNOSIS — M810 Age-related osteoporosis without current pathological fracture: Secondary | ICD-10-CM | POA: Diagnosis not present

## 2023-08-18 DIAGNOSIS — M9701XD 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, subsequent encounter: Secondary | ICD-10-CM | POA: Diagnosis not present

## 2023-08-18 DIAGNOSIS — F509 Eating disorder, unspecified: Secondary | ICD-10-CM | POA: Diagnosis not present

## 2023-08-21 ENCOUNTER — Encounter (HOSPITAL_BASED_OUTPATIENT_CLINIC_OR_DEPARTMENT_OTHER): Payer: Medicare HMO | Admitting: Physical Therapy

## 2023-08-21 DIAGNOSIS — Z96641 Presence of right artificial hip joint: Secondary | ICD-10-CM | POA: Diagnosis not present

## 2023-08-21 DIAGNOSIS — S52591D Other fractures of lower end of right radius, subsequent encounter for closed fracture with routine healing: Secondary | ICD-10-CM | POA: Diagnosis not present

## 2023-08-21 DIAGNOSIS — S52601D Unspecified fracture of lower end of right ulna, subsequent encounter for closed fracture with routine healing: Secondary | ICD-10-CM | POA: Diagnosis not present

## 2023-08-21 DIAGNOSIS — I10 Essential (primary) hypertension: Secondary | ICD-10-CM | POA: Diagnosis not present

## 2023-08-21 DIAGNOSIS — F509 Eating disorder, unspecified: Secondary | ICD-10-CM | POA: Diagnosis not present

## 2023-08-21 DIAGNOSIS — E785 Hyperlipidemia, unspecified: Secondary | ICD-10-CM | POA: Diagnosis not present

## 2023-08-21 DIAGNOSIS — Z7982 Long term (current) use of aspirin: Secondary | ICD-10-CM | POA: Diagnosis not present

## 2023-08-21 DIAGNOSIS — M9701XD 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, subsequent encounter: Secondary | ICD-10-CM | POA: Diagnosis not present

## 2023-08-21 DIAGNOSIS — M810 Age-related osteoporosis without current pathological fracture: Secondary | ICD-10-CM | POA: Diagnosis not present

## 2023-08-24 NOTE — Progress Notes (Unsigned)
Post-Op Visit Note   Patient: Cheryl Glass           Date of Birth: 05/14/1951           MRN: [id number] Visit Date: 08/25/2023 PCP: Sigmund Hazel, MD   Assessment & Plan:  Chief Complaint: No chief complaint on file.  Visit Diagnoses:  1. 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 Closed fracture of right distal radius and ulna, initial encounter     Plan: ***  Follow-Up Instructions: No follow-ups on file.   Orders:  No orders of the defined types were placed in this encounter.  No orders of the defined types were placed in this encounter.   Imaging: No results found.  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) 07/30/2023   Closed subcapital fracture of neck of right femur, initial encounter (HCC) 07/16/2023   Closed fracture of right distal radius and ulna, initial encounter 07/16/2023   Past Medical History:  Diagnosis Date   Arthritis    Eating disorder    Hyperlipidemia    Hypertension    Osteoporosis    Right patella fracture    Wrist fracture 2013   right    Family History  Problem Relation Age of Onset   Cancer Father        pancreatic   Diabetes Father    Thyroid disease Sister        hypothyroid   Osteoporosis Maternal Grandmother    Breast cancer Neg Hx     Past Surgical History:  Procedure Laterality Date   AUGMENTATION MAMMAPLASTY     BREAST ENHANCEMENT SURGERY     CESAREAN SECTION     EXAM UNDER ANESTHESIA WITH MANIPULATION OF KNEE Right 06/06/2020   Procedure: EXAM UNDER ANESTHESIA WITH MANIPULATION OF KNEE;  Surgeon: Eldred Manges, MD;  Location: [facility];  Service: Orthopedics;  Laterality: Right;   HARDWARE REMOVAL Right 06/06/2020   Procedure: right knee wire removal;  Surgeon: Eldred Manges,  MD;  Location: [facility];  Service: Orthopedics;  Laterality: Right;   HYSTEROSCOPY     OPEN REDUCTION INTERNAL FIXATION (ORIF) DISTAL RADIAL FRACTURE Right 07/16/2023   Procedure: OPEN REDUCTION INTERNAL FIXATION (ORIF) DISTAL RADIUS FRACTURE;  Surgeon: Tarry Kos, MD;  Location: [facility];  Service: Orthopedics;  Laterality: Right;   ORIF PATELLA Right 04/20/2020   Procedure: OPEN REDUCTION INTERNAL (ORIF) FIXATION RIGHT PATELLA;  Surgeon: Eldred Manges, MD;  Location: [facility];  Service: Orthopedics;  Laterality: Right;   TOTAL HIP ARTHROPLASTY Right 07/16/2023   Procedure: TOTAL HIP ARTHROPLASTY ANTERIOR APPROACH;  Surgeon: Tarry Kos, MD;  Location: [facility];  Service: Orthopedics;  Laterality: Right;   TOTAL HIP REVISION Right 07/31/2023   Procedure: OPEN REDUCTION INTERNAL FIXATION AND REVISION OF RIGHT PERIPROSTHETIC HIP;  Surgeon: Tarry Kos, MD;  Location: [facility];  Service: Orthopedics;  Laterality: Right;   Social History   Occupational History   Not on file  Tobacco Use   Smoking status: Never   Smokeless tobacco: Never  Vaping Use   Vaping status: Never Used  Substance and Sexual Activity   Alcohol use: No    Alcohol/week: 0.0 standard drinks of alcohol   Drug use: No   Sexual activity: Yes  Partners: Male    Birth control/protection: Post-menopausal    Comment: husband vasectomy

## 2023-08-25 ENCOUNTER — Encounter: Payer: Self-pay | Admitting: Orthopaedic Surgery

## 2023-08-25 ENCOUNTER — Ambulatory Visit (INDEPENDENT_AMBULATORY_CARE_PROVIDER_SITE_OTHER): Payer: Medicare HMO | Admitting: Orthopaedic Surgery

## 2023-08-25 ENCOUNTER — Other Ambulatory Visit (INDEPENDENT_AMBULATORY_CARE_PROVIDER_SITE_OTHER): Payer: Medicare HMO

## 2023-08-25 ENCOUNTER — Encounter (HOSPITAL_BASED_OUTPATIENT_CLINIC_OR_DEPARTMENT_OTHER): Payer: Medicare HMO | Admitting: Physical Therapy

## 2023-08-25 ENCOUNTER — Other Ambulatory Visit: Payer: Self-pay | Admitting: Physician Assistant

## 2023-08-25 DIAGNOSIS — S52501A Unspecified fracture of the lower end of right radius, initial encounter for closed fracture: Secondary | ICD-10-CM

## 2023-08-25 DIAGNOSIS — M9701XA 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, initial encounter: Secondary | ICD-10-CM

## 2023-08-25 DIAGNOSIS — S52601A Unspecified fracture of lower end of right ulna, initial encounter for closed fracture: Secondary | ICD-10-CM

## 2023-08-26 ENCOUNTER — Telehealth: Payer: Self-pay | Admitting: Physician Assistant

## 2023-08-26 DIAGNOSIS — F509 Eating disorder, unspecified: Secondary | ICD-10-CM | POA: Diagnosis not present

## 2023-08-26 DIAGNOSIS — S52591D Other fractures of lower end of right radius, subsequent encounter for closed fracture with routine healing: Secondary | ICD-10-CM | POA: Diagnosis not present

## 2023-08-26 DIAGNOSIS — M810 Age-related osteoporosis without current pathological fracture: Secondary | ICD-10-CM | POA: Diagnosis not present

## 2023-08-26 DIAGNOSIS — Z7982 Long term (current) use of aspirin: Secondary | ICD-10-CM | POA: Diagnosis not present

## 2023-08-26 DIAGNOSIS — Z96641 Presence of right artificial hip joint: Secondary | ICD-10-CM | POA: Diagnosis not present

## 2023-08-26 DIAGNOSIS — S52601D Unspecified fracture of lower end of right ulna, subsequent encounter for closed fracture with routine healing: Secondary | ICD-10-CM | POA: Diagnosis not present

## 2023-08-26 DIAGNOSIS — E785 Hyperlipidemia, unspecified: Secondary | ICD-10-CM | POA: Diagnosis not present

## 2023-08-26 DIAGNOSIS — I10 Essential (primary) hypertension: Secondary | ICD-10-CM | POA: Diagnosis not present

## 2023-08-26 DIAGNOSIS — M9701XD 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, subsequent encounter: Secondary | ICD-10-CM | POA: Diagnosis not present

## 2023-08-26 NOTE — Telephone Encounter (Signed)
Erica (PT) from [facility] called need ing an call back ASAP.She states she is with pt and need clarification on range of motion. Please call Erica on secure line at [phone].

## 2023-08-26 NOTE — Telephone Encounter (Signed)
Notified Erica with OT.

## 2023-08-26 NOTE — Telephone Encounter (Signed)
Posterior hip precautions.  ROM of wrist as tolerated.  Full ROM of elbow and shoulder.

## 2023-08-27 DIAGNOSIS — M9701XD 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, subsequent encounter: Secondary | ICD-10-CM | POA: Diagnosis not present

## 2023-08-27 DIAGNOSIS — E785 Hyperlipidemia, unspecified: Secondary | ICD-10-CM | POA: Diagnosis not present

## 2023-08-27 DIAGNOSIS — M810 Age-related osteoporosis without current pathological fracture: Secondary | ICD-10-CM | POA: Diagnosis not present

## 2023-08-27 DIAGNOSIS — I10 Essential (primary) hypertension: Secondary | ICD-10-CM | POA: Diagnosis not present

## 2023-08-27 DIAGNOSIS — S52601D Unspecified fracture of lower end of right ulna, subsequent encounter for closed fracture with routine healing: Secondary | ICD-10-CM | POA: Diagnosis not present

## 2023-08-27 DIAGNOSIS — Z7982 Long term (current) use of aspirin: Secondary | ICD-10-CM | POA: Diagnosis not present

## 2023-08-27 DIAGNOSIS — S52591D Other fractures of lower end of right radius, subsequent encounter for closed fracture with routine healing: Secondary | ICD-10-CM | POA: Diagnosis not present

## 2023-08-27 DIAGNOSIS — Z96641 Presence of right artificial hip joint: Secondary | ICD-10-CM | POA: Diagnosis not present

## 2023-08-27 DIAGNOSIS — F509 Eating disorder, unspecified: Secondary | ICD-10-CM | POA: Diagnosis not present

## 2023-08-28 ENCOUNTER — Encounter (HOSPITAL_BASED_OUTPATIENT_CLINIC_OR_DEPARTMENT_OTHER): Payer: Medicare HMO | Admitting: Physical Therapy

## 2023-08-28 ENCOUNTER — Telehealth: Payer: Self-pay | Admitting: Orthopaedic Surgery

## 2023-08-28 ENCOUNTER — Telehealth: Payer: Self-pay

## 2023-08-28 DIAGNOSIS — M9701XD 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, subsequent encounter: Secondary | ICD-10-CM | POA: Diagnosis not present

## 2023-08-28 DIAGNOSIS — I10 Essential (primary) hypertension: Secondary | ICD-10-CM | POA: Diagnosis not present

## 2023-08-28 DIAGNOSIS — Z96641 Presence of right artificial hip joint: Secondary | ICD-10-CM | POA: Diagnosis not present

## 2023-08-28 DIAGNOSIS — Z7982 Long term (current) use of aspirin: Secondary | ICD-10-CM | POA: Diagnosis not present

## 2023-08-28 DIAGNOSIS — M810 Age-related osteoporosis without current pathological fracture: Secondary | ICD-10-CM | POA: Diagnosis not present

## 2023-08-28 DIAGNOSIS — F509 Eating disorder, unspecified: Secondary | ICD-10-CM | POA: Diagnosis not present

## 2023-08-28 DIAGNOSIS — S52591D Other fractures of lower end of right radius, subsequent encounter for closed fracture with routine healing: Secondary | ICD-10-CM | POA: Diagnosis not present

## 2023-08-28 DIAGNOSIS — S52601D Unspecified fracture of lower end of right ulna, subsequent encounter for closed fracture with routine healing: Secondary | ICD-10-CM | POA: Diagnosis not present

## 2023-08-28 DIAGNOSIS — E785 Hyperlipidemia, unspecified: Secondary | ICD-10-CM | POA: Diagnosis not present

## 2023-08-28 NOTE — Telephone Encounter (Signed)
Called Charri and gave verbal orders for visits. Also tried to call patient. No answer. LMOM that she was suppose to receive a heel lift in the office at her last visit. IF she did not get this we can leave a heel lift up front for pick up. I ask her to call me back.

## 2023-08-28 NOTE — Telephone Encounter (Signed)
Spoke with patient. She is going to have her husband come by Monday to pick up some different heel lifts and see what works best for her. They will be up front for pick up.

## 2023-08-28 NOTE — Telephone Encounter (Signed)
Charri from [facility] wants to extend her physical therapy, 1 wk 1, 2 wk 3. She stated that he needs a heel lift and they don't provide that so can you send it to [facility]. CB#[phone]

## 2023-08-31 ENCOUNTER — Encounter (HOSPITAL_BASED_OUTPATIENT_CLINIC_OR_DEPARTMENT_OTHER): Payer: Medicare HMO | Admitting: Physical Therapy

## 2023-08-31 DIAGNOSIS — I10 Essential (primary) hypertension: Secondary | ICD-10-CM | POA: Diagnosis not present

## 2023-08-31 DIAGNOSIS — M810 Age-related osteoporosis without current pathological fracture: Secondary | ICD-10-CM | POA: Diagnosis not present

## 2023-08-31 DIAGNOSIS — Z7982 Long term (current) use of aspirin: Secondary | ICD-10-CM | POA: Diagnosis not present

## 2023-08-31 DIAGNOSIS — E785 Hyperlipidemia, unspecified: Secondary | ICD-10-CM | POA: Diagnosis not present

## 2023-08-31 DIAGNOSIS — S52601D Unspecified fracture of lower end of right ulna, subsequent encounter for closed fracture with routine healing: Secondary | ICD-10-CM | POA: Diagnosis not present

## 2023-08-31 DIAGNOSIS — Z96641 Presence of right artificial hip joint: Secondary | ICD-10-CM | POA: Diagnosis not present

## 2023-08-31 DIAGNOSIS — F509 Eating disorder, unspecified: Secondary | ICD-10-CM | POA: Diagnosis not present

## 2023-08-31 DIAGNOSIS — S52591D Other fractures of lower end of right radius, subsequent encounter for closed fracture with routine healing: Secondary | ICD-10-CM | POA: Diagnosis not present

## 2023-08-31 DIAGNOSIS — M9701XD 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, subsequent encounter: Secondary | ICD-10-CM | POA: Diagnosis not present

## 2023-09-01 DIAGNOSIS — M810 Age-related osteoporosis without current pathological fracture: Secondary | ICD-10-CM | POA: Diagnosis not present

## 2023-09-01 DIAGNOSIS — Z96641 Presence of right artificial hip joint: Secondary | ICD-10-CM | POA: Diagnosis not present

## 2023-09-01 DIAGNOSIS — I10 Essential (primary) hypertension: Secondary | ICD-10-CM | POA: Diagnosis not present

## 2023-09-01 DIAGNOSIS — M9701XD 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, subsequent encounter: Secondary | ICD-10-CM | POA: Diagnosis not present

## 2023-09-01 DIAGNOSIS — S52591D Other fractures of lower end of right radius, subsequent encounter for closed fracture with routine healing: Secondary | ICD-10-CM | POA: Diagnosis not present

## 2023-09-01 DIAGNOSIS — F509 Eating disorder, unspecified: Secondary | ICD-10-CM | POA: Diagnosis not present

## 2023-09-01 DIAGNOSIS — S52601D Unspecified fracture of lower end of right ulna, subsequent encounter for closed fracture with routine healing: Secondary | ICD-10-CM | POA: Diagnosis not present

## 2023-09-01 DIAGNOSIS — E785 Hyperlipidemia, unspecified: Secondary | ICD-10-CM | POA: Diagnosis not present

## 2023-09-01 DIAGNOSIS — Z7982 Long term (current) use of aspirin: Secondary | ICD-10-CM | POA: Diagnosis not present

## 2023-09-02 ENCOUNTER — Encounter (HOSPITAL_BASED_OUTPATIENT_CLINIC_OR_DEPARTMENT_OTHER): Payer: Medicare HMO | Admitting: Physical Therapy

## 2023-09-02 DIAGNOSIS — E785 Hyperlipidemia, unspecified: Secondary | ICD-10-CM | POA: Diagnosis not present

## 2023-09-02 DIAGNOSIS — M9701XD 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, subsequent encounter: Secondary | ICD-10-CM | POA: Diagnosis not present

## 2023-09-02 DIAGNOSIS — S52591D Other fractures of lower end of right radius, subsequent encounter for closed fracture with routine healing: Secondary | ICD-10-CM | POA: Diagnosis not present

## 2023-09-02 DIAGNOSIS — Z7982 Long term (current) use of aspirin: Secondary | ICD-10-CM | POA: Diagnosis not present

## 2023-09-02 DIAGNOSIS — F509 Eating disorder, unspecified: Secondary | ICD-10-CM | POA: Diagnosis not present

## 2023-09-02 DIAGNOSIS — I10 Essential (primary) hypertension: Secondary | ICD-10-CM | POA: Diagnosis not present

## 2023-09-02 DIAGNOSIS — S52601D Unspecified fracture of lower end of right ulna, subsequent encounter for closed fracture with routine healing: Secondary | ICD-10-CM | POA: Diagnosis not present

## 2023-09-02 DIAGNOSIS — Z96641 Presence of right artificial hip joint: Secondary | ICD-10-CM | POA: Diagnosis not present

## 2023-09-02 DIAGNOSIS — M810 Age-related osteoporosis without current pathological fracture: Secondary | ICD-10-CM | POA: Diagnosis not present

## 2023-09-07 DIAGNOSIS — I10 Essential (primary) hypertension: Secondary | ICD-10-CM | POA: Diagnosis not present

## 2023-09-07 DIAGNOSIS — Z96641 Presence of right artificial hip joint: Secondary | ICD-10-CM | POA: Diagnosis not present

## 2023-09-07 DIAGNOSIS — S52601D Unspecified fracture of lower end of right ulna, subsequent encounter for closed fracture with routine healing: Secondary | ICD-10-CM | POA: Diagnosis not present

## 2023-09-07 DIAGNOSIS — Z7982 Long term (current) use of aspirin: Secondary | ICD-10-CM | POA: Diagnosis not present

## 2023-09-07 DIAGNOSIS — F509 Eating disorder, unspecified: Secondary | ICD-10-CM | POA: Diagnosis not present

## 2023-09-07 DIAGNOSIS — M9701XD 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, subsequent encounter: Secondary | ICD-10-CM | POA: Diagnosis not present

## 2023-09-07 DIAGNOSIS — M810 Age-related osteoporosis without current pathological fracture: Secondary | ICD-10-CM | POA: Diagnosis not present

## 2023-09-07 DIAGNOSIS — S52591D Other fractures of lower end of right radius, subsequent encounter for closed fracture with routine healing: Secondary | ICD-10-CM | POA: Diagnosis not present

## 2023-09-07 DIAGNOSIS — E785 Hyperlipidemia, unspecified: Secondary | ICD-10-CM | POA: Diagnosis not present

## 2023-09-08 ENCOUNTER — Other Ambulatory Visit (INDEPENDENT_AMBULATORY_CARE_PROVIDER_SITE_OTHER): Payer: Self-pay

## 2023-09-08 ENCOUNTER — Ambulatory Visit (INDEPENDENT_AMBULATORY_CARE_PROVIDER_SITE_OTHER): Payer: Medicare HMO | Admitting: Physician Assistant

## 2023-09-08 DIAGNOSIS — S52601A Unspecified fracture of lower end of right ulna, initial encounter for closed fracture: Secondary | ICD-10-CM

## 2023-09-08 DIAGNOSIS — M9701XA 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, initial encounter: Secondary | ICD-10-CM

## 2023-09-08 DIAGNOSIS — S52501A Unspecified fracture of the lower end of right radius, initial encounter for closed fracture: Secondary | ICD-10-CM

## 2023-09-08 NOTE — Progress Notes (Addendum)
Post-Op Visit Note   Patient: Cheryl Glass           Date of Birth: 05-08-1951           MRN: [id number] Visit Date: 09/08/2023 PCP: Sigmund Hazel, MD   Assessment & Plan:  Chief Complaint:  Chief Complaint  Patient presents with   Right Hip - Follow-up   Visit Diagnoses:  1. 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 Closed fracture of right distal radius and ulna, initial encounter     Plan: Patient is a pleasant 72 year old female who comes in today approximately 7 weeks status post ORIF right distal radius fracture 07/16/2023 and approximately 6 weeks status post ORIF right periprosthetic femur fracture 07/31/2023.  In regards to the right hip, she has noticed increased tightness to the anterior thigh and is unsure whether this is due to wearing heel lifts or the stretches she has been doing in therapy.  She has been compliant taking a baby aspirin twice daily for DVT prophylaxis.  She is also wearing compression socks for left lower extremity swelling.  She has been wearing a Velcro splint and doing OT for her right wrist.  Currently platform weightbearing with a walker.  Examination of her right hip reveals fully healed surgical scars without complication.  Painless hip flexion.  Mild swelling to the left lower extremity.  No calf pain either side.  She is neurovascularly intact distally.  Right wrist exam reveals painless range of motion.  She is neurovascularly intact distally.  At this point, she is clinically and radiographically improving.  In regards to the right hip, she will continue with physical therapy.  I recommended heat prior to exercises and ice after.  In regards to the wrist, we will have her wear the splint for another week.  She may then begin weightbearing without the splint.  Continue OT/PT.  Follow-up in 6 weeks for repeat evaluation and 2 view x-rays of the right wrist and x-rays of the right hip and AP  pelvis.  Call with concerns or questions.  Follow-Up Instructions: Return in about 4 weeks (around 10/06/2023).   Orders:  Orders Placed This Encounter  Procedures   XR Wrist 2 Views Right   XR HIP UNILAT W OR W/O PELVIS 2-3 VIEWS RIGHT   No orders of the defined types were placed in this encounter.   Imaging: XR HIP UNILAT W OR W/O PELVIS 2-3 VIEWS RIGHT  Result Date: 09/08/2023 X-rays of the right hip reveal stable alignment of the fracture.  No evidence of hardware complication.  Well-seated prosthesis.  XR Wrist 2 Views Right  Result Date: 09/08/2023 X-rays demonstrate stable alignment of the fracture with evidence of consolidation.  No hardware complication.   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) 07/30/2023   Closed subcapital fracture of neck of right femur, initial encounter (HCC) 07/16/2023   Closed fracture of right distal radius and ulna, initial encounter 07/16/2023   Past Medical History:  Diagnosis Date   Arthritis    Eating disorder    Hyperlipidemia    Hypertension    Osteoporosis    Right patella fracture    Wrist fracture 2013   right    Family History  Problem Relation Age of Onset   Cancer Father  pancreatic   Diabetes Father    Thyroid disease Sister        hypothyroid   Osteoporosis Maternal Grandmother    Breast cancer Neg Hx     Past Surgical History:  Procedure Laterality Date   AUGMENTATION MAMMAPLASTY     BREAST ENHANCEMENT SURGERY     CESAREAN SECTION     EXAM UNDER ANESTHESIA WITH MANIPULATION OF KNEE Right 06/06/2020   Procedure: EXAM UNDER ANESTHESIA WITH MANIPULATION OF KNEE;  Surgeon: Eldred Manges, MD;  Location: [facility];  Service: Orthopedics;  Laterality: Right;   HARDWARE REMOVAL Right 06/06/2020   Procedure: right knee wire  removal;  Surgeon: Eldred Manges, MD;  Location: [facility];  Service: Orthopedics;  Laterality: Right;   HYSTEROSCOPY     OPEN REDUCTION INTERNAL FIXATION (ORIF) DISTAL RADIAL FRACTURE Right 07/16/2023   Procedure: OPEN REDUCTION INTERNAL FIXATION (ORIF) DISTAL RADIUS FRACTURE;  Surgeon: Tarry Kos, MD;  [facility]: [facility];  Service: Orthopedics;  Laterality: Right;   ORIF PATELLA Right 04/20/2020   Procedure: OPEN REDUCTION INTERNAL (ORIF) FIXATION RIGHT PATELLA;  Surgeon: Eldred Manges, MD;  Location: [facility];  Service: Orthopedics;  Laterality: Right;   TOTAL HIP ARTHROPLASTY Right 07/16/2023   Procedure: TOTAL HIP ARTHROPLASTY ANTERIOR APPROACH;  Surgeon: Tarry Kos, MD;  Location: [facility];  Service: Orthopedics;  Laterality: Right;   TOTAL HIP REVISION Right 07/31/2023   Procedure: OPEN REDUCTION INTERNAL FIXATION AND REVISION OF RIGHT PERIPROSTHETIC HIP;  Surgeon: Tarry Kos, MD;  Location: [facility];  Service: Orthopedics;  Laterality: Right;   Social History   Occupational History   Not on file  Tobacco Use   Smoking status: Never   Smokeless tobacco: Never  Vaping Use   Vaping status: Never Used  Substance and Sexual Activity   Alcohol use: No    Alcohol/week: 0.0 standard drinks of alcohol   Drug use: No   Sexual activity: Yes    Partners: Male    Birth control/protection: Post-menopausal    Comment: husband vasectomy

## 2023-09-10 DIAGNOSIS — I10 Essential (primary) hypertension: Secondary | ICD-10-CM | POA: Diagnosis not present

## 2023-09-10 DIAGNOSIS — F509 Eating disorder, unspecified: Secondary | ICD-10-CM | POA: Diagnosis not present

## 2023-09-10 DIAGNOSIS — Z7982 Long term (current) use of aspirin: Secondary | ICD-10-CM | POA: Diagnosis not present

## 2023-09-10 DIAGNOSIS — Z96641 Presence of right artificial hip joint: Secondary | ICD-10-CM | POA: Diagnosis not present

## 2023-09-10 DIAGNOSIS — M810 Age-related osteoporosis without current pathological fracture: Secondary | ICD-10-CM | POA: Diagnosis not present

## 2023-09-10 DIAGNOSIS — E785 Hyperlipidemia, unspecified: Secondary | ICD-10-CM | POA: Diagnosis not present

## 2023-09-10 DIAGNOSIS — S52601D Unspecified fracture of lower end of right ulna, subsequent encounter for closed fracture with routine healing: Secondary | ICD-10-CM | POA: Diagnosis not present

## 2023-09-10 DIAGNOSIS — S52591D Other fractures of lower end of right radius, subsequent encounter for closed fracture with routine healing: Secondary | ICD-10-CM | POA: Diagnosis not present

## 2023-09-10 DIAGNOSIS — M9701XD 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, subsequent encounter: Secondary | ICD-10-CM | POA: Diagnosis not present

## 2023-09-11 ENCOUNTER — Other Ambulatory Visit: Payer: Self-pay | Admitting: Physician Assistant

## 2023-09-16 DIAGNOSIS — Z7982 Long term (current) use of aspirin: Secondary | ICD-10-CM | POA: Diagnosis not present

## 2023-09-16 DIAGNOSIS — M810 Age-related osteoporosis without current pathological fracture: Secondary | ICD-10-CM | POA: Diagnosis not present

## 2023-09-16 DIAGNOSIS — S52601D Unspecified fracture of lower end of right ulna, subsequent encounter for closed fracture with routine healing: Secondary | ICD-10-CM | POA: Diagnosis not present

## 2023-09-16 DIAGNOSIS — S52591D Other fractures of lower end of right radius, subsequent encounter for closed fracture with routine healing: Secondary | ICD-10-CM | POA: Diagnosis not present

## 2023-09-16 DIAGNOSIS — Z96641 Presence of right artificial hip joint: Secondary | ICD-10-CM | POA: Diagnosis not present

## 2023-09-16 DIAGNOSIS — M9701XD 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, subsequent encounter: Secondary | ICD-10-CM | POA: Diagnosis not present

## 2023-09-16 DIAGNOSIS — I10 Essential (primary) hypertension: Secondary | ICD-10-CM | POA: Diagnosis not present

## 2023-09-16 DIAGNOSIS — E785 Hyperlipidemia, unspecified: Secondary | ICD-10-CM | POA: Diagnosis not present

## 2023-09-16 DIAGNOSIS — F509 Eating disorder, unspecified: Secondary | ICD-10-CM | POA: Diagnosis not present

## 2023-09-17 DIAGNOSIS — S52601D Unspecified fracture of lower end of right ulna, subsequent encounter for closed fracture with routine healing: Secondary | ICD-10-CM | POA: Diagnosis not present

## 2023-09-17 DIAGNOSIS — I10 Essential (primary) hypertension: Secondary | ICD-10-CM | POA: Diagnosis not present

## 2023-09-17 DIAGNOSIS — M9701XD 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, subsequent encounter: Secondary | ICD-10-CM | POA: Diagnosis not present

## 2023-09-17 DIAGNOSIS — E785 Hyperlipidemia, unspecified: Secondary | ICD-10-CM | POA: Diagnosis not present

## 2023-09-17 DIAGNOSIS — M810 Age-related osteoporosis without current pathological fracture: Secondary | ICD-10-CM | POA: Diagnosis not present

## 2023-09-17 DIAGNOSIS — Z96641 Presence of right artificial hip joint: Secondary | ICD-10-CM | POA: Diagnosis not present

## 2023-09-17 DIAGNOSIS — Z7982 Long term (current) use of aspirin: Secondary | ICD-10-CM | POA: Diagnosis not present

## 2023-09-17 DIAGNOSIS — S52591D Other fractures of lower end of right radius, subsequent encounter for closed fracture with routine healing: Secondary | ICD-10-CM | POA: Diagnosis not present

## 2023-09-17 DIAGNOSIS — F509 Eating disorder, unspecified: Secondary | ICD-10-CM | POA: Diagnosis not present

## 2023-09-22 ENCOUNTER — Encounter: Payer: Medicare HMO | Admitting: Orthopaedic Surgery

## 2023-09-22 DIAGNOSIS — E785 Hyperlipidemia, unspecified: Secondary | ICD-10-CM | POA: Diagnosis not present

## 2023-09-22 DIAGNOSIS — Z96641 Presence of right artificial hip joint: Secondary | ICD-10-CM | POA: Diagnosis not present

## 2023-09-22 DIAGNOSIS — M810 Age-related osteoporosis without current pathological fracture: Secondary | ICD-10-CM | POA: Diagnosis not present

## 2023-09-22 DIAGNOSIS — I10 Essential (primary) hypertension: Secondary | ICD-10-CM | POA: Diagnosis not present

## 2023-09-22 DIAGNOSIS — Z7982 Long term (current) use of aspirin: Secondary | ICD-10-CM | POA: Diagnosis not present

## 2023-09-22 DIAGNOSIS — S52601D Unspecified fracture of lower end of right ulna, subsequent encounter for closed fracture with routine healing: Secondary | ICD-10-CM | POA: Diagnosis not present

## 2023-09-22 DIAGNOSIS — M9701XD 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, subsequent encounter: Secondary | ICD-10-CM | POA: Diagnosis not present

## 2023-09-22 DIAGNOSIS — F509 Eating disorder, unspecified: Secondary | ICD-10-CM | POA: Diagnosis not present

## 2023-09-22 DIAGNOSIS — S52591D Other fractures of lower end of right radius, subsequent encounter for closed fracture with routine healing: Secondary | ICD-10-CM | POA: Diagnosis not present

## 2023-09-24 ENCOUNTER — Telehealth: Payer: Self-pay | Admitting: Orthopaedic Surgery

## 2023-09-24 NOTE — Telephone Encounter (Signed)
Called and gave verbal 

## 2023-09-24 NOTE — Telephone Encounter (Signed)
Mallory from [facility] called for verbal orders. They want to push back the discharge for next week. [phone]

## 2023-09-29 DIAGNOSIS — M810 Age-related osteoporosis without current pathological fracture: Secondary | ICD-10-CM | POA: Diagnosis not present

## 2023-09-29 DIAGNOSIS — M9701XD 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, subsequent encounter: Secondary | ICD-10-CM | POA: Diagnosis not present

## 2023-09-29 DIAGNOSIS — F509 Eating disorder, unspecified: Secondary | ICD-10-CM | POA: Diagnosis not present

## 2023-09-29 DIAGNOSIS — S52601D Unspecified fracture of lower end of right ulna, subsequent encounter for closed fracture with routine healing: Secondary | ICD-10-CM | POA: Diagnosis not present

## 2023-09-29 DIAGNOSIS — S52591D Other fractures of lower end of right radius, subsequent encounter for closed fracture with routine healing: Secondary | ICD-10-CM | POA: Diagnosis not present

## 2023-09-29 DIAGNOSIS — I10 Essential (primary) hypertension: Secondary | ICD-10-CM | POA: Diagnosis not present

## 2023-09-29 DIAGNOSIS — Z96641 Presence of right artificial hip joint: Secondary | ICD-10-CM | POA: Diagnosis not present

## 2023-09-29 DIAGNOSIS — E785 Hyperlipidemia, unspecified: Secondary | ICD-10-CM | POA: Diagnosis not present

## 2023-09-29 DIAGNOSIS — Z7982 Long term (current) use of aspirin: Secondary | ICD-10-CM | POA: Diagnosis not present

## 2023-10-08 ENCOUNTER — Ambulatory Visit: Payer: Medicare HMO | Admitting: Physician Assistant

## 2023-10-08 ENCOUNTER — Encounter: Payer: Self-pay | Admitting: Physician Assistant

## 2023-10-08 ENCOUNTER — Other Ambulatory Visit (INDEPENDENT_AMBULATORY_CARE_PROVIDER_SITE_OTHER): Payer: Medicare HMO

## 2023-10-08 DIAGNOSIS — S52501A Unspecified fracture of the lower end of right radius, initial encounter for closed fracture: Secondary | ICD-10-CM

## 2023-10-08 DIAGNOSIS — M9701XA 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, initial encounter: Secondary | ICD-10-CM

## 2023-10-08 DIAGNOSIS — S52601A Unspecified fracture of lower end of right ulna, initial encounter for closed fracture: Secondary | ICD-10-CM

## 2023-10-08 NOTE — Progress Notes (Signed)
 She has been doing much better  Post-Op Visit Note   Patient: Cheryl Glass           Date of Birth: 08-09-1951           MRN: [id number] Visit Date: 10/08/2023 PCP: Cleotilde Planas, MD   Assessment & Plan:  Chief Complaint:  Chief Complaint  Patient presents with   Right Wrist - Follow-up    ORIF distal radius  07/16/2023   Right Hip - Follow-up    ORIF periprosthetic femur 07/31/2023   Visit Diagnoses:  1. 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 Closed fracture of right distal radius and ulna, initial encounter     Plan: Patient is a 73 year old female who comes in today approximately 12 weeks status post ORIF right distal radius fracture 07/16/2023 and approximately 11 weeks status post ORIF right periprosthetic femur fracture 07/31/2023.  She has been doing much better.  She is currently ambulating with a single-point cane.  In regards to her wrist, she is no longer wearing her splint.  She has been working on therapy at home.  She has no pain.  She did have a nylon suture popped out in the office today.  In regards to the hip, she still feels some tightness to the anterior lateral aspect.  This does seem to improve throughout the day.  She uses a QB which is like an under the desk elliptical at home.  She has also been getting home health physical therapy.  Examination of her right wrist reveals a fully healed surgical scar without complication.  There does not appear to be any remaining nylon sutures.  She still has some limitation with range of motion although this does not cause pain.  She does have decreased sensation to the volar side of the right thumb.  Right hip exam reveals painless hip flexion.  She has some weakness with this, however.  In regards to the wrist, she is clinically and radiographically improved.  She will continue to work on a home exercise program.  She has asked to outpatient physical therapy that she will  be getting for her hip can take a look at her wrist to see if they think she needs more.  Follow-up as needed for her wrist.  In regards to her hip, we have sent in a referral for outpatient physical therapy.  She will continue to advance with activity as tolerated.  Follow-up in 2 months for repeat evaluation and 2 view x-rays of the right hip.  Call with concerns or questions.  Follow-Up Instructions: Return in about 2 months (around 12/06/2023).   Orders:  Orders Placed This Encounter  Procedures   XR HIP UNILAT W OR W/O PELVIS 2-3 VIEWS RIGHT   XR Wrist Complete Right   No orders of the defined types were placed in this encounter.   Imaging: XR HIP UNILAT W OR W/O PELVIS 2-3 VIEWS RIGHT Result Date: 10/08/2023 X-rays demonstrate a well-seated prosthesis without complication.  No hardware complication.  XR Wrist Complete Right Result Date: 10/08/2023 X-rays demonstrate consolidation of the fracture site.  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) 07/30/2023   Closed subcapital fracture of neck of right femur, initial encounter (HCC) 07/16/2023   Closed fracture of right distal  radius and ulna, initial encounter 07/16/2023   Past Medical History:  Diagnosis Date   Arthritis    Eating disorder    Hyperlipidemia    Hypertension    Osteoporosis    Right patella fracture    Wrist fracture 2013   right    Family History  Problem Relation Age of Onset   Cancer Father        pancreatic   Diabetes Father    Thyroid  disease Sister        hypothyroid   Osteoporosis Maternal Grandmother    Breast cancer Neg Hx     Past Surgical History:  Procedure Laterality Date   AUGMENTATION MAMMAPLASTY     BREAST ENHANCEMENT SURGERY     CESAREAN SECTION     EXAM UNDER ANESTHESIA WITH  MANIPULATION OF KNEE Right 06/06/2020   Procedure: EXAM UNDER ANESTHESIA WITH MANIPULATION OF KNEE;  Surgeon: Barbarann Oneil BROCKS, MD;  Location: [facility];  Service: Orthopedics;  Laterality: Right;   HARDWARE REMOVAL Right 06/06/2020   Procedure: right knee wire removal;  Surgeon: Barbarann Oneil BROCKS, MD;  Location: [facility];  Service: Orthopedics;  Laterality: Right;   HYSTEROSCOPY     OPEN REDUCTION INTERNAL FIXATION (ORIF) DISTAL RADIAL FRACTURE Right 07/16/2023   Procedure: OPEN REDUCTION INTERNAL FIXATION (ORIF) DISTAL RADIUS FRACTURE;  Surgeon: Jerri Kay HERO, MD;  [facility];  Service: Orthopedics;  Laterality: Right;   ORIF PATELLA Right 04/20/2020   Procedure: OPEN REDUCTION INTERNAL (ORIF) FIXATION RIGHT PATELLA;  Surgeon: Barbarann Oneil BROCKS, MD;  Location: [facility];  Service: Orthopedics;  Laterality: Right;   TOTAL HIP ARTHROPLASTY Right 07/16/2023   Procedure: TOTAL HIP ARTHROPLASTY ANTERIOR APPROACH;  Surgeon: Jerri Kay HERO, MD;  [facility];  Service: Orthopedics;  Laterality: Right;   TOTAL HIP REVISION Right 07/31/2023   Procedure: OPEN REDUCTION INTERNAL FIXATION AND REVISION OF RIGHT PERIPROSTHETIC HIP;  Surgeon: Jerri Kay HERO, MD;  [facility];  Service: Orthopedics;  Laterality: Right;   Social History   Occupational History   Not on file  Tobacco Use   Smoking status: Never   Smokeless tobacco: Never  Vaping Use   Vaping status: Never Used  Substance and Sexual Activity   Alcohol use: No    Alcohol/week: 0.0 standard drinks of alcohol   Drug use: No   Sexual activity: Yes    Partners: Male    Birth control/protection: Post-menopausal    Comment: husband vasectomy

## 2023-10-19 NOTE — Therapy (Signed)
 OUTPATIENT PHYSICAL THERAPY EVALUATION   Patient Name: Cheryl Glass MRN: [id number] DOB:Apr 03, 1951, 73 y.o., female Today's Date: 10/20/2023  END OF SESSION:  PT End of Session - 10/20/23 0838     Visit Number 1    Number of Visits 24    Date for PT Re-Evaluation 01/12/24    Authorization Type [employer]    Authorization - Visit Number 1    Authorization - Number of Visits 12    Progress Note Due on Visit 10    PT Start Time 0848    PT Stop Time 0920    PT Time Calculation (min) 32 min    Activity Tolerance Patient tolerated treatment well    Behavior During Therapy Anxious             Past Medical History:  Diagnosis Date   Arthritis    Eating disorder    Hyperlipidemia    Hypertension    Osteoporosis    Right patella fracture    Wrist fracture 2013   right   Past Surgical History:  Procedure Laterality Date   AUGMENTATION MAMMAPLASTY     BREAST ENHANCEMENT SURGERY     CESAREAN SECTION     EXAM UNDER ANESTHESIA WITH MANIPULATION OF KNEE Right 06/06/2020   Procedure: EXAM UNDER ANESTHESIA WITH MANIPULATION OF KNEE;  Surgeon: Barbarann Oneil BROCKS, MD;  Location: [facility];  Service: Orthopedics;  Laterality: Right;   HARDWARE REMOVAL Right 06/06/2020   Procedure: right knee wire removal;  Surgeon: Barbarann Oneil BROCKS, MD;  Location: [facility];  Service: Orthopedics;  Laterality: Right;   HYSTEROSCOPY     OPEN REDUCTION INTERNAL FIXATION (ORIF) DISTAL RADIAL FRACTURE Right 07/16/2023   Procedure: OPEN REDUCTION INTERNAL FIXATION (ORIF) DISTAL RADIUS FRACTURE;  Surgeon: Jerri Kay HERO, MD;  [facility];  Service: Orthopedics;  Laterality: Right;   ORIF PATELLA Right 04/20/2020   Procedure: OPEN REDUCTION INTERNAL (ORIF) FIXATION RIGHT PATELLA;  Surgeon: Barbarann Oneil BROCKS, MD;  [facility];  Service: Orthopedics;  Laterality: Right;   TOTAL HIP ARTHROPLASTY Right 07/16/2023   Procedure: TOTAL HIP ARTHROPLASTY ANTERIOR APPROACH;  Surgeon: Jerri Kay HERO, MD;  [facility];  Service: Orthopedics;  Laterality: Right;   TOTAL HIP REVISION Right 07/31/2023   Procedure: OPEN REDUCTION INTERNAL FIXATION AND REVISION OF RIGHT PERIPROSTHETIC HIP;  Surgeon: Jerri Kay HERO, MD;  Location[facility];  Service: Orthopedics;  Laterality: Right;   Patient Active Problem List   Diagnosis Date Noted   History of revision of total replacement of right hip joint 07/31/2023   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) 07/30/2023   Closed subcapital fracture of neck of right femur, initial encounter (HCC) 07/16/2023   Closed fracture of right distal radius and ulna, initial encounter 07/16/2023    PCP: Cleotilde Planas MD  REFERRING PROVIDER: Jule Ronal CROME, PA-C  REFERRING DIAG: [phone] (ICD-10-CM) - 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  Pt indicated fall when walking that led to fracture in hip and Rt wrist and had surgery on 07/16/2023.  Pt indicated tightness was the  chief complaints.   Pt indicated still having some complaints with Rt knee stiffness from med history.  Sleeping well at this time.  Pt reported having some anxiety about sitting in low chairs and falls.  Reported still having help with shower setup due to reaching faucet difficulty.  Using strap to help leg.  Pt indicated sleeping in chair due to difficulty getting into bed due to height.   Pt indicated doing exercise on wrist/hand alone.   Has under table bike to use every day.  Has dog.   PERTINENT HISTORY: PMH includes:  arthritis, HLD, HTN, osteoporosis, Rt patella fx, Rt wrist fx. ORIF Rt patella 04/20/2020, wire removal 06/06/2020  PAIN:  NPRS scale: 4-5/10 Pain location: Rt hip Pain description: tightness Aggravating factors: transfers, walking Relieving factors: ice  PRECAUTIONS: None  WEIGHT BEARING RESTRICTIONS: No  FALLS:  Has patient fallen in last 6 months? 1. Has fear of falling.   LIVING ENVIRONMENT: Lives in: House/apartment Stairs: 2 stairs with no rail.  Has following equipment at home: [facility]  OCCUPATION: Retired  PLOF: Independent, walking for exercise, taking care of dog, total gym at home, routine exercise including squatting, planks.   PATIENT GOALS: Get back to walking on [address].   OBJECTIVE:   PATIENT SURVEYS:  10/20/2023 FOTO intake:  43  predicted:  63  COGNITION: 10/20/2023 Overall cognitive status: WFL    SENSATION: 10/20/2023 WFL  EDEMA:  10/20/2023 No specific measurements.   MUSCLE LENGTH: 10/20/2023 No specific testing today  POSTURE:  10/20/2023 WB shift to Lt in standing.  SPC in Lt hand.   PALPATION: 10/20/2023 General tenderness to tough in Rt anterior and lateral thigh.   LOWER EXTREMITY ROM:  10/20/2023:  unable to assess as Pt was not willing to lie on table today.    ROM Right 10/20/2023 Left 10/20/2023  Hip flexion    Hip extension    Hip abduction    Hip adduction    Hip internal rotation    Hip external rotation    Knee flexion    Knee extension    Ankle dorsiflexion    Ankle plantarflexion    Ankle inversion    Ankle eversion     (Blank rows = not tested)  LOWER EXTREMITY MMT:  10/20/2023: Testing limited in hip due to patient desire to not lie on table today   MMT Right 10/20/2023 Left 10/20/2023  Hip flexion 3+/5 4/5  Hip extension    Hip abduction    Hip adduction    Hip internal rotation    Hip external rotation    Knee flexion 4/5 5/5  Knee extension 4/5 5/5  Ankle dorsiflexion 5/5 5/5  Ankle plantarflexion     Ankle inversion    Ankle eversion     (Blank rows = not tested)  LOWER EXTREMITY SPECIAL TESTS:  10/20/2023 No specific testing performed today.   FUNCTIONAL TESTS:  10/20/2023 18 inch chair transfer: unable unassisted.  Lt SLS: 30 seconds Rt SLS: 8 seconds  No TUG due to patient not wanting to sit in standard chair height.   GAIT: 10/20/2023 SPC use in Lt hand with reduced stance phase, reduced toe off progression, reduced gait speed.  TODAY'S TREATMENT                                                                          DATE: 10/20/2023 Therex:    HEP instruction/performance c cues for techniques, handout provided.  Trial set performed of each for comprehension and symptom assessment.  See below for exercise list  PATIENT EDUCATION:  10/20/2023 Education details: HEP, POC Person educated: Patient Education method: Explanation, Demonstration, Verbal cues, and Handouts Education comprehension: verbalized understanding, returned demonstration, and verbal cues required  HOME EXERCISE PROGRAM: Access Code: [email or web address] URL: [email or web address] Date: 10/20/2023 Prepared by: Ozell Silvan  Exercises - Seated Quad Set  - 2 x daily - 7 x weekly - 1 sets - 10 reps - 5 hold - Seated March  - 2 x daily - 7 x weekly - 2-3 sets - 10 reps - Seated Long Arc Quad  - 2 x daily - 7 x weekly - 2-3 sets - 10 reps - 2 hold - Seated Hip Abduction with Resistance  - 2 x daily - 7 x weekly - 3 sets - 10 reps - Tandem Stance in Corner  - 1 x daily - 7 x weekly - 1 sets - 3-5 reps - 30 hold  ASSESSMENT:  CLINICAL IMPRESSION: Patient is a 73 y.o. who comes to clinic with complaints of Rt hip/leg pain with mobility, strength and movement coordination deficits that impair their ability to perform usual daily and  recreational functional activities without increase difficulty/symptoms at this time.  Patient to benefit from skilled PT services to address impairments and limitations to improve to previous level of function without restriction secondary to condition.   OBJECTIVE IMPAIRMENTS: Abnormal gait, decreased activity tolerance, decreased balance, decreased coordination, decreased endurance, decreased mobility, difficulty walking, decreased ROM, decreased strength, increased fascial restrictions, impaired perceived functional ability, increased muscle spasms, impaired flexibility, improper body mechanics, postural dysfunction, and pain.   ACTIVITY LIMITATIONS: carrying, lifting, bending, sitting, standing, squatting, sleeping, stairs, transfers, bed mobility, bathing, dressing, hygiene/grooming, and locomotion level  PARTICIPATION LIMITATIONS: meal prep, cleaning, laundry, interpersonal relationship, driving, shopping, community activity, and yard work  PERSONAL FACTORS: Past/current experiences, Time since onset of injury/illness/exacerbation, and osteoporosis, HTN, hyperlipidemia, Rt knee patellar fracture history.   are also affecting patient's functional outcome.   REHAB POTENTIAL: Good  CLINICAL DECISION MAKING: Stable/uncomplicated  EVALUATION COMPLEXITY: Low   GOALS: Goals reviewed with patient? Yes  SHORT TERM GOALS: (target date for Short term goals are 3 weeks 11/10/2023)   1.  Patient will demonstrate independent use of home exercise program to maintain progress from in clinic treatments.  Goal status: New  LONG TERM GOALS: (target dates for all long term goals are 12 weeks  01/12/2024 )   1. Patient will demonstrate/report pain at worst less than or equal to 2/10 to facilitate minimal limitation in daily activity secondary to pain symptoms.  Goal status: New   2. Patient will demonstrate independent use of home exercise program to facilitate ability to maintain/progress functional  gains from skilled physical therapy services.  Goal status: New   3. Patient will demonstrate FOTO outcome > or = 63 % to indicate reduced disability due to condition.  Goal status: New   4.  Patient will demonstrate Rt LE MMT 4/5 or greater  throughout to faciltiate usual transfers, stairs, squatting at [facility] for daily life.   Goal status: New   5.  Patient will demonstrate independent ambulation community distances > 500 ft for walking exercise/community integration.  Goal status: New   6.  Patient will demonstrate ascending/descending stairs with reciprocal pattern s UE assist for household entry/exit.  Goal status: New   7.  Patient will demonstrate ability to get in and out of bed for sleeping in bed (higher bed height with step stool required).  Goal Status: New   PLAN:  PT FREQUENCY: 1-2x/week  PT DURATION: 12 weeks  PLANNED INTERVENTIONS: Can include 02853- PT Re-evaluation, 97110-Therapeutic exercises, 97530- Therapeutic activity, 97112- Neuromuscular re-education, [phone]- Self Care, 97140- Manual therapy, [phone]- Gait training, [phone]- Orthotic Fit/training, [phone]- Aquatic Therapy, [phone]- Electrical stimulation (unattended),   Patient/Family education, Balance training, Stair training, Taping, Dry Needling, Joint mobilization, Joint manipulation, Spinal manipulation, Spinal mobilization, Scar mobilization, Vestibular training, Visual/preceptual remediation/compensation, DME instructions, Cryotherapy, and Moist heat.  All performed as medically necessary.  All included unless contraindicated  PLAN FOR NEXT SESSION: Review HEP knowledge/results.   Progressive mobility and strength improvements as tolerated.  Precede cautiously according to patient desires.   Ozell Silvan, PT, DPT, OCS, ATC 10/20/23  9:27 AM   Referring diagnosis? 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 (if  different than referring diagnosis) M25.551 Pain in Rt hip What was this (referring dx) caused by? [x]  Surgery [x]  Fall []  Ongoing issue []  Arthritis []  Other: ____________  Laterality: [x]  Rt []  Lt []  Both  Check all possible CPT codes:  *CHOOSE 10 OR LESS*    See Planned Interventions listed in the Plan section of the Evaluation.

## 2023-10-20 ENCOUNTER — Encounter: Payer: Self-pay | Admitting: Rehabilitative and Restorative Service Providers"

## 2023-10-20 ENCOUNTER — Ambulatory Visit: Payer: Medicare HMO | Admitting: Rehabilitative and Restorative Service Providers"

## 2023-10-20 DIAGNOSIS — M25551 Pain in right hip: Secondary | ICD-10-CM | POA: Diagnosis not present

## 2023-10-20 DIAGNOSIS — R262 Difficulty in walking, not elsewhere classified: Secondary | ICD-10-CM | POA: Diagnosis not present

## 2023-10-20 DIAGNOSIS — M6281 Muscle weakness (generalized): Secondary | ICD-10-CM | POA: Diagnosis not present

## 2023-10-28 ENCOUNTER — Encounter: Payer: Self-pay | Admitting: Physical Therapy

## 2023-10-28 ENCOUNTER — Ambulatory Visit: Payer: Medicare HMO | Admitting: Physical Therapy

## 2023-10-28 DIAGNOSIS — M6281 Muscle weakness (generalized): Secondary | ICD-10-CM | POA: Diagnosis not present

## 2023-10-28 DIAGNOSIS — M25551 Pain in right hip: Secondary | ICD-10-CM

## 2023-10-28 DIAGNOSIS — R262 Difficulty in walking, not elsewhere classified: Secondary | ICD-10-CM | POA: Diagnosis not present

## 2023-10-28 NOTE — Therapy (Signed)
OUTPATIENT PHYSICAL THERAPY EVALUATION   Patient Name: Clementene Montilla MRN: [id number] DOB:07/18/1951, 73 y.o., female Today's Date: 10/28/2023  END OF SESSION:  PT End of Session - 10/28/23 0950     Visit Number 2    Number of Visits 24    Date for PT Re-Evaluation 01/12/24    Authorization Type [employer]    Authorization - Visit Number 2    Authorization - Number of Visits 12    Progress Note Due on Visit 10    PT Start Time [phone]    PT Stop Time 1009    PT Time Calculation (min) 38 min    Activity Tolerance Patient tolerated treatment well    Behavior During Therapy Anxious             Past Medical History:  Diagnosis Date   Arthritis    Eating disorder    Hyperlipidemia    Hypertension    Osteoporosis    Right patella fracture    Wrist fracture 2013   right   Past Surgical History:  Procedure Laterality Date   AUGMENTATION MAMMAPLASTY     BREAST ENHANCEMENT SURGERY     CESAREAN SECTION     EXAM UNDER ANESTHESIA WITH MANIPULATION OF KNEE Right 06/06/2020   Procedure: EXAM UNDER ANESTHESIA WITH MANIPULATION OF KNEE;  Surgeon: Eldred Manges, MD;  Location: [facility];  Service: Orthopedics;  Laterality: Right;   HARDWARE REMOVAL Right 06/06/2020   Procedure: right knee wire removal;  Surgeon: Eldred Manges, MD;  Location:  SUR[facility]ERY CENTER;  Service: Orthopedics;  Laterality: Right;   HYSTEROSCOPY     OPEN REDUCTION INTERNAL FIXATION (ORIF) DISTAL RADIAL FRACTURE Right 07/16/2023   Procedure: OPEN REDUCTION INTERNAL FIXATION (ORIF) DISTAL RADIUS FRACTURE;  Surgeon: Tarry Kos, MD;  Location: [facility];  Service: Orthopedics;  Laterality: Right;   ORIF PATELLA Right 04/20/2020   Procedure: OPEN REDUCTION INTERNAL (ORIF) FIXATION RIGHT PATELLA;  Surgeon: Eldred Manges, MD;  Location: [facility];  Service: Orthopedics;  Laterality: Right;   TOTAL HIP ARTHROPLASTY Right 07/16/2023   Procedure: TOTAL HIP ARTHROPLASTY ANTERIOR APPROACH;  Surgeon: Tarry Kos, MD;  Location: [facility];  Service: Orthopedics;  Laterality: Right;   TOTAL HIP REVISION Right 07/31/2023   Procedure: OPEN REDUCTION INTERNAL FIXATION AND REVISION OF RIGHT PERIPROSTHETIC HIP;  Surgeon: Tarry Kos, MD;  Location: [facility];  Service: Orthopedics;  Laterality: Right;   Patient Active Problem List   Diagnosis Date Noted   History of revision of total replacement of right hip joint 07/31/2023   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) 07/30/2023   Closed subcapital fracture of neck of right femur, initial encounter (HCC) 07/16/2023   Closed fracture of right distal radius and ulna, initial encounter 07/16/2023    PCP: Sigmund Hazel MD  REFERRING PROVIDER: Cristie Hem, PA-C  REFERRING DIAG: [phone] (ICD-10-CM) - 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 She was able to get in/out of bed now after PT showed her ways to do this.   PERTINENT HISTORY: PMH includes: arthritis, HLD,  HTN, osteoporosis, Rt patella fx, Rt wrist fx. ORIF Rt patella 04/20/2020, wire removal 06/06/2020  PAIN:  NPRS scale: 4-5/10 Pain location: Rt knee today Pain description: tightness Aggravating factors: transfers, walking Relieving factors: ice  PRECAUTIONS: None  WEIGHT BEARING RESTRICTIONS: No  FALLS:  Has patient fallen in last 6 months? 1. Has fear of falling.   LIVING ENVIRONMENT: Lives in: House/apartment Stairs: 2 stairs with no rail.  Has following equipment at home: [facility]  OCCUPATION: Retired  PLOF: Independent,  walking for exercise, taking care of dog, total gym at home, routine exercise including squatting, planks.   PATIENT GOALS: Get back to walking on [address].   OBJECTIVE:   PATIENT SURVEYS:  10/20/2023 FOTO intake:  43  predicted:  63  COGNITION: 10/20/2023 Overall cognitive status: WFL    SENSATION: 10/20/2023 WFL  EDEMA:  10/20/2023 No specific measurements.   MUSCLE LENGTH: 10/20/2023 No specific testing today  POSTURE:  10/20/2023 WB shift to Lt in standing.  SPC in Lt hand.   PALPATION: 10/20/2023 General tenderness to tough in Rt anterior and lateral thigh.   LOWER EXTREMITY ROM:  10/20/2023:  unable to assess as Pt was not willing to lie on table today.    ROM Right 10/28/2023   Hip flexion 85 AAROM with strap   Hip extension    Hip abduction    Hip adduction    Hip internal rotation    Hip external rotation    Knee flexion    Knee extension    Ankle dorsiflexion    Ankle plantarflexion    Ankle inversion    Ankle eversion     (Blank rows = not tested)  LOWER EXTREMITY MMT:  10/20/2023: Testing limited in hip due to patient desire to not lie on table today   MMT Right 10/20/2023 Left 10/20/2023  Hip flexion 3+/5 4/5  Hip extension    Hip abduction    Hip adduction    Hip internal rotation    Hip external rotation    Knee flexion 4/5 5/5  Knee extension 4/5 5/5  Ankle dorsiflexion 5/5 5/5  Ankle plantarflexion    Ankle inversion    Ankle eversion     (Blank rows = not tested)  LOWER EXTREMITY SPECIAL TESTS:  10/20/2023 No specific testing performed today.   FUNCTIONAL TESTS:  10/20/2023 18 inch chair transfer: unable unassisted.  Lt SLS: 30 seconds Rt SLS: 8 seconds  No TUG due to patient not wanting to sit in standard chair height.   GAIT: 10/20/2023 SPC use in Lt hand with reduced stance phase, reduced toe off progression, reduced gait speed.  TODAY'S TREATMENT                                                                          DATE:  10/28/2023 Therex Supine heelslides AAROM 5 sec X 10 Supine heelsides AAROM with foot on medium ball 5 sec X 10 Supine bridges X 10 Supine SAQ X 10 with black bolster under knees Supine hip adduction ball squeeze with black bolster under knees Seated LAQ X 10 bilat Sit to stand with UE support from raised table height 24 inch X 5 reps Standing marches with bilat UE support X 10 bilat Sidestepping 6 feet to left and 6 feet to right X 3 round trips without UE support and with supervision Walking without assistance 6 feet forward then 6 feet backwards 3 trips each with supervision  10/20/2023 Therex:    HEP instruction/performance c cues for techniques, handout provided.  Trial set performed of each for comprehension and symptom assessment.  See below for exercise list  PATIENT EDUCATION:  10/20/2023 Education details: HEP, POC Person educated: Patient Education method: Explanation, Demonstration, Verbal cues, and Handouts Education comprehension: verbalized understanding, returned demonstration, and verbal cues required  HOME EXERCISE PROGRAM: Access Code: [email or web address] URL: [email or web address] Date: 10/20/2023 Prepared by: Chyrel Masson  Exercises - Seated Quad Set  - 2 x daily - 7 x weekly - 1 sets - 10 reps - 5 hold - Seated March  - 2 x daily - 7 x weekly - 2-3 sets - 10 reps - Seated Long Arc Quad  - 2 x daily - 7 x weekly - 2-3 sets - 10 reps - 2 hold - Seated Hip Abduction with Resistance  - 2 x daily - 7 x weekly - 3 sets - 10 reps - Tandem Stance in Corner  - 1 x daily - 7 x weekly - 1 sets - 3-5 reps - 30 hold  ASSESSMENT:  CLINICAL IMPRESSION: She is now able to get in/out of bed independently. She had good tolerance to exercises without aggravating her hip and even reports her knee  feels better after session. PT recommending to continue with current plan.  OBJECTIVE IMPAIRMENTS: Abnormal gait, decreased activity tolerance, decreased balance, decreased coordination, decreased endurance, decreased mobility, difficulty walking, decreased ROM, decreased strength, increased fascial restrictions, impaired perceived functional ability, increased muscle spasms, impaired flexibility, improper body mechanics, postural dysfunction, and pain.   ACTIVITY LIMITATIONS: carrying, lifting, bending, sitting, standing, squatting, sleeping, stairs, transfers, bed mobility, bathing, dressing, hygiene/grooming, and locomotion level  PARTICIPATION LIMITATIONS: meal prep, cleaning, laundry, interpersonal relationship, driving, shopping, community activity, and yard work  PERSONAL FACTORS: Past/current experiences, Time since onset of injury/illness/exacerbation, and osteoporosis, HTN, hyperlipidemia, Rt knee patellar fracture history.   are also affecting patient's functional outcome.   REHAB POTENTIAL: Good  CLINICAL DECISION MAKING: Stable/uncomplicated  EVALUATION COMPLEXITY: Low   GOALS: Goals reviewed with patient? Yes  SHORT TERM GOALS: (target date for Short term goals are 3 weeks 11/10/2023)   1.  Patient will demonstrate independent use of home exercise program to maintain progress from in clinic treatments.  Goal status: New  LONG TERM GOALS: (target dates for all long term goals are 12 weeks  01/12/2024 )   1. Patient will demonstrate/report  pain at worst less than or equal to 2/10 to facilitate minimal limitation in daily activity secondary to pain symptoms.  Goal status: New   2. Patient will demonstrate independent use of home exercise program to facilitate ability to maintain/progress functional gains from skilled physical therapy services.  Goal status: New   3. Patient will demonstrate FOTO outcome > or = 63 % to indicate reduced disability due to condition.  Goal  status: New   4.  Patient will demonstrate Rt LE MMT 4/5 or greater  throughout to faciltiate usual transfers, stairs, squatting at [facility] for daily life.   Goal status: New   5.  Patient will demonstrate independent ambulation community distances > 500 ft for walking exercise/community integration.  Goal status: New   6.  Patient will demonstrate ascending/descending stairs with reciprocal pattern s UE assist for household entry/exit.  Goal status: New   7.  Patient will demonstrate ability to get in and out of bed for sleeping in bed (higher bed height with step stool required).  Goal Status: New   PLAN:  PT FREQUENCY: 1-2x/week  PT DURATION: 12 weeks  PLANNED INTERVENTIONS: Can include 16109- PT Re-evaluation, 97110-Therapeutic exercises, 97530- Therapeutic activity, 97112- Neuromuscular re-education, [phone]- Self Care, 97140- Manual therapy, [phone]- Gait training, [phone]- Orthotic Fit/training, [phone]- Aquatic Therapy, [phone]- Electrical stimulation (unattended),   Patient/Family education, Balance training, Stair training, Taping, Dry Needling, Joint mobilization, Joint manipulation, Spinal manipulation, Spinal mobilization, Scar mobilization, Vestibular training, Visual/preceptual remediation/compensation, DME instructions, Cryotherapy, and Moist heat.  All performed as medically necessary.  All included unless contraindicated  PLAN FOR NEXT SESSION: Review HEP knowledge/results.   Progressive mobility and strength improvements as tolerated.  Precede cautiously according to patient desires.   Ivery Quale, PT, DPT 10/28/23 10:24 AM    Referring diagnosis? 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 (if different than referring diagnosis) M25.551 Pain in Rt hip What was this (referring dx) caused by? [x]  Surgery [x]  Fall []  Ongoing issue []  Arthritis []  Other:  ____________  Laterality: [x]  Rt []  Lt []  Both  Check all possible CPT codes:  *CHOOSE 10 OR LESS*    See Planned Interventions listed in the Plan section of the Evaluation.

## 2023-11-02 ENCOUNTER — Encounter: Payer: Medicare HMO | Admitting: Physical Therapy

## 2023-11-04 ENCOUNTER — Encounter: Payer: Medicare HMO | Admitting: Rehabilitative and Restorative Service Providers"

## 2023-11-09 ENCOUNTER — Encounter: Payer: Self-pay | Admitting: Rehabilitative and Restorative Service Providers"

## 2023-11-09 ENCOUNTER — Ambulatory Visit: Payer: Medicare HMO | Admitting: Rehabilitative and Restorative Service Providers"

## 2023-11-09 DIAGNOSIS — M25551 Pain in right hip: Secondary | ICD-10-CM | POA: Diagnosis not present

## 2023-11-09 DIAGNOSIS — M6281 Muscle weakness (generalized): Secondary | ICD-10-CM | POA: Diagnosis not present

## 2023-11-09 DIAGNOSIS — R262 Difficulty in walking, not elsewhere classified: Secondary | ICD-10-CM | POA: Diagnosis not present

## 2023-11-09 NOTE — Therapy (Signed)
OUTPATIENT PHYSICAL THERAPY TREATMENT   Patient Name: Cheryl Glass MRN: [id number] DOB:25-May-1951, 73 y.o., female Today's Date: 11/09/2023  END OF SESSION:  PT End of Session - 11/09/23 1055     Visit Number 3    Number of Visits 24    Date for PT Re-Evaluation 01/12/24    Authorization Type [employer] $25 copay    Authorization Time Period -01/12/2024    Authorization - Visit Number 3    Authorization - Number of Visits 12    Progress Note Due on Visit 10    PT Start Time 1100    PT Stop Time 1138    PT Time Calculation (min) 38 min    Activity Tolerance Patient tolerated treatment well    Behavior During Therapy Anxious              Past Medical History:  Diagnosis Date   Arthritis    Eating disorder    Hyperlipidemia    Hypertension    Osteoporosis    Right patella fracture    Wrist fracture 2013   right   Past Surgical History:  Procedure Laterality Date   AUGMENTATION MAMMAPLASTY     BREAST ENHANCEMENT SURGERY     CESAREAN SECTION     EXAM UNDER ANESTHESIA WITH MANIPULATION OF KNEE Right 06/06/2020   Procedure: EXAM UNDER ANESTHESIA WITH MANIPULATION OF KNEE;  Surgeon: Eldred Manges, MD;  Location: [facility];  Service: Orthopedics;  Laterality: Right;   HARDWARE REMOVAL Right 06/06/2020   Procedure: right knee wire removal;  Surgeon: Eldred Manges, MD;  Location: [facility];  Service: Orthopedics;  Laterality: Right;   HYSTEROSCOPY     OPEN REDUCTION INTERNAL FIXATION (ORIF) DISTAL RADIAL FRACTURE Right 07/16/2023   Procedure: OPEN REDUCTION INTERNAL FIXATION (ORIF) DISTAL RADIUS FRACTURE;  Surgeon: Tarry Kos, MD;  Location: [facility];  Service: Orthopedics;  Laterality: Right;   ORIF PATELLA Right 04/20/2020   Procedure: OPEN REDUCTION INTERNAL (ORIF) FIXATION RIGHT PATELLA;  Surgeon: Eldred Manges, MD;  Location: [facility];  Service: Orthopedics;  Laterality: Right;   TOTAL HIP ARTHROPLASTY Right 07/16/2023   Procedure: TOTAL  HIP ARTHROPLASTY ANTERIOR APPROACH;  Surgeon: Tarry Kos, MD;  Location: [facility];  Service: Orthopedics;  Laterality: Right;   TOTAL HIP REVISION Right 07/31/2023   Procedure: OPEN REDUCTION INTERNAL FIXATION AND REVISION OF RIGHT PERIPROSTHETIC HIP;  Surgeon: Tarry Kos, MD;  Location: [facility];  Service: Orthopedics;  Laterality: Right;   Patient Active Problem List   Diagnosis Date Noted   History of revision of total replacement of right hip joint 07/31/2023   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) 07/30/2023   Closed subcapital fracture of neck of right femur, initial encounter (HCC) 07/16/2023   Closed fracture of right distal radius and ulna, initial encounter 07/16/2023    PCP: Sigmund Hazel MD  REFERRING PROVIDER: Cristie Hem, PA-C  REFERRING DIAG: [phone] (ICD-10-CM) - 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  Pt indicated missing last week as husband is in hospital and she wasn't.   PERTINENT HISTORY: PMH includes:  arthritis, HLD, HTN, osteoporosis, Rt patella fx, Rt wrist fx. ORIF Rt patella 04/20/2020, wire removal 06/06/2020  PAIN:  NPRS scale: 0/10 Pain location: Rt knee today Pain description: tightness Aggravating factors: transfers, walking Relieving factors: ice  PRECAUTIONS: None  WEIGHT BEARING RESTRICTIONS: No  FALLS:  Has patient fallen in last 6 months? 1. Has fear of falling.   LIVING ENVIRONMENT: Lives in: House/apartment Stairs: 2 stairs with no rail.  Has following equipment at home:  [facility]  OCCUPATION: Retired  PLOF: Independent, walking for exercise, taking care of dog, total gym at home, routine exercise including squatting, planks.   PATIENT GOALS: Get back to walking on [address].   OBJECTIVE:   PATIENT SURVEYS:  10/20/2023 FOTO intake:  43  predicted:  63  COGNITION: 10/20/2023 Overall cognitive status: WFL    SENSATION: 10/20/2023 WFL  EDEMA:  10/20/2023 No specific measurements.   MUSCLE LENGTH: 10/20/2023 No specific testing today  POSTURE:  10/20/2023 WB shift to Lt in standing.  SPC in Lt hand.   PALPATION: 10/20/2023 General tenderness to tough in Rt anterior and lateral thigh.   LOWER EXTREMITY ROM:  10/20/2023:  unable to assess as Pt was not willing to lie on table today.    ROM Right 10/28/2023   Hip flexion 85 AAROM with strap   Hip extension    Hip abduction    Hip adduction    Hip internal rotation    Hip external rotation    Knee flexion    Knee extension    Ankle dorsiflexion    Ankle plantarflexion    Ankle inversion    Ankle eversion     (Blank rows = not tested)  LOWER EXTREMITY MMT:  10/20/2023: Testing limited in hip due to patient desire to not lie on table today   MMT Right 10/20/2023 Left 10/20/2023  Hip flexion 3+/5 4/5  Hip extension    Hip abduction    Hip adduction    Hip internal rotation    Hip external rotation    Knee flexion 4/5 5/5  Knee extension 4/5 5/5  Ankle dorsiflexion 5/5 5/5  Ankle plantarflexion    Ankle inversion    Ankle eversion     (Blank rows = not tested)  LOWER EXTREMITY SPECIAL TESTS:  10/20/2023 No specific testing performed today.   FUNCTIONAL TESTS:  10/20/2023 18 inch chair transfer: unable unassisted.  Lt SLS: 30 seconds Rt SLS: 8 seconds  No TUG due to patient not wanting to sit in standard chair height.   GAIT: 10/20/2023 SPC use in Lt hand with reduced stance phase, reduced toe off progression, reduced gait speed.  TODAY'S TREATMENT                                                                          DATE: 11/09/2023 Therex Nustep Lvl 5 10.5 mins for ROM/general aerobic Supine Rt leg heel slide AROM 5 sec hold, quad set 5 sec hold x 10 each Supine bridge 2 sec hold 2 x 10   Additional time spent in education of exercise routine and printout of new exercises.  Verbal cues throughout performance.   TherActivity Education on [employer] lift with Rt leg off ground to facilitate faucet turn on and other activity for picking up activity.  Reactive blazepod pedal pressing for driving simulation with 2 sec light time random green or red (gas/brake) with variable off time 1-3 seconds  30 seconds x 5 with 30 second breaks.  Sit to stand to sit 20 inch table height without UE assist.  Verbal cues for encouragement and techniques.  Performed x 5  TODAY'S TREATMENT                                                                          DATE: 10/28/2023 Therex Supine heelslides AAROM 5 sec X 10 Supine heelsides AAROM with foot on medium ball 5 sec X 10 Supine bridges X 10 Supine SAQ X 10 with black bolster under knees Supine hip adduction ball squeeze with black bolster under knees Seated LAQ X 10 bilat Sit to stand with UE support from raised table height 24 inch X 5 reps Standing marches with bilat UE support X 10 bilat Sidestepping 6 feet to left and 6 feet to right X 3 round trips without UE support and with supervision Walking without assistance 6 feet forward then 6 feet backwards 3 trips each with supervision  TODAY'S TREATMENT                                                                          DATE: 10/20/2023 Therex:    HEP instruction/performance c cues for techniques, handout provided.  Trial set performed of each for comprehension and symptom assessment.  See below for  exercise list  PATIENT EDUCATION:  10/20/2023 Education details: HEP, POC Person educated: Patient Education method: Explanation, Demonstration, Verbal cues, and Handouts Education comprehension: verbalized understanding, returned demonstration, and verbal cues required  HOME EXERCISE PROGRAM: Access Code: [email or web address] URL: [email or web address] Date: 11/09/2023 Prepared by: Chyrel Masson  Exercises - Seated Quad Set  - 2 x daily - 7 x weekly - 1 sets - 10 reps - 5 hold - Seated March  - 2 x daily - 7 x weekly - 2-3 sets - 10 reps - Seated Long Arc Quad  - 2  x daily - 7 x weekly - 2-3 sets - 10 reps - 2 hold - Seated Hip Abduction with Resistance  - 2 x daily - 7 x weekly - 3 sets - 10 reps - Tandem Stance in Corner  - 1 x daily - 7 x weekly - 1 sets - 3-5 reps - 30 hold - Supine Heel Slide  - 1-2 x daily - 7 x weekly - 1 sets - 10 reps - 5 hold - Supine Quadricep Sets  - 1-2 x daily - 7 x weekly - 1 sets - 10 reps - 5 hold - Supine Bridge  - 1-2 x daily - 7 x weekly - 2 sets - 10 reps - 2 hold  ASSESSMENT:  CLINICAL IMPRESSION: Continued focus on improving confidence in movement ability with walking, transfers and other daily activity.  Pt to benefit from skilled PT services to continue to address strength and mobility deficits.   Improving slowly with confidence in movement but progression noted.   Pt indicated needling to leave early from visit to get to hospital with family.     OBJECTIVE IMPAIRMENTS: Abnormal gait, decreased activity tolerance, decreased balance, decreased coordination, decreased endurance, decreased mobility, difficulty walking, decreased ROM, decreased strength, increased fascial restrictions, impaired perceived functional ability, increased muscle spasms, impaired flexibility, improper body mechanics, postural dysfunction, and pain.   ACTIVITY LIMITATIONS: carrying, lifting, bending, sitting, standing, squatting, sleeping, stairs, transfers, bed  mobility, bathing, dressing, hygiene/grooming, and locomotion level  PARTICIPATION LIMITATIONS: meal prep, cleaning, laundry, interpersonal relationship, driving, shopping, community activity, and yard work  PERSONAL FACTORS: Past/current experiences, Time since onset of injury/illness/exacerbation, and osteoporosis, HTN, hyperlipidemia, Rt knee patellar fracture history.   are also affecting patient's functional outcome.   REHAB POTENTIAL: Good  CLINICAL DECISION MAKING: Stable/uncomplicated  EVALUATION COMPLEXITY: Low   GOALS: Goals reviewed with patient? Yes  SHORT TERM GOALS: (target date for Short term goals are 3 weeks 11/10/2023)   1.  Patient will demonstrate independent use of home exercise program to maintain progress from in clinic treatments.  Goal status: on going 11/09/2023  LONG TERM GOALS: (target dates for all long term goals are 12 weeks  01/12/2024 )   1. Patient will demonstrate/report pain at worst less than or equal to 2/10 to facilitate minimal limitation in daily activity secondary to pain symptoms.  Goal status: New   2. Patient will demonstrate independent use of home exercise program to facilitate ability to maintain/progress functional gains from skilled physical therapy services.  Goal status: New   3. Patient will demonstrate FOTO outcome > or = 63 % to indicate reduced disability due to condition.  Goal status: New   4.  Patient will demonstrate Rt LE MMT 4/5 or greater  throughout to faciltiate usual transfers, stairs, squatting at [facility] for daily life.   Goal status: New   5.  Patient will demonstrate independent ambulation community distances > 500 ft for walking exercise/community integration.  Goal status: New   6.  Patient will demonstrate ascending/descending stairs with reciprocal pattern s UE assist for household entry/exit.  Goal status: New   7.  Patient will demonstrate ability to get in and out of bed for sleeping in bed (higher bed  height with step stool required).  Goal Status: New   PLAN:  PT FREQUENCY: 1-2x/week  PT DURATION: 12 weeks  PLANNED INTERVENTIONS: Can include 69629- PT Re-evaluation, 97110-Therapeutic exercises, 97530- Therapeutic activity, [id number]- Neuromuscular re-education, 97535- Self Care, 97140- Manual therapy, [phone]- Gait training,  16109- Orthotic Fit/training, 60454- Aquatic Therapy, [phone]- Electrical stimulation (unattended),   Patient/Family education, Balance training, Stair training, Taping, Dry Needling, Joint mobilization, Joint manipulation, Spinal manipulation, Spinal mobilization, Scar mobilization, Vestibular training, Visual/preceptual remediation/compensation, DME instructions, Cryotherapy, and Moist heat.  All performed as medically necessary.  All included unless contraindicated  PLAN FOR NEXT SESSION: Progressive mobility gains, strengthening as able.   Chyrel Masson, PT, DPT, OCS, ATC 11/09/23  11:46 AM      Referring diagnosis? 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 (if different than referring diagnosis) M25.551 Pain in Rt hip What was this (referring dx) caused by? [x]  Surgery [x]  Fall []  Ongoing issue []  Arthritis []  Other: ____________  Laterality: [x]  Rt []  Lt []  Both  Check all possible CPT codes:  *CHOOSE 10 OR LESS*    See Planned Interventions listed in the Plan section of the Evaluation.

## 2023-11-10 ENCOUNTER — Telehealth: Payer: Self-pay | Admitting: Orthopedic Surgery

## 2023-11-10 NOTE — Telephone Encounter (Signed)
Please cancel Cheryl Glass physical therapy appt with Casimiro Needle for tomorrow.  She said to please let him know that her husband has been placed in Hospice care.

## 2023-11-11 ENCOUNTER — Encounter: Payer: Medicare HMO | Admitting: Rehabilitative and Restorative Service Providers"

## 2023-11-17 ENCOUNTER — Encounter: Payer: Medicare HMO | Admitting: Rehabilitative and Restorative Service Providers"

## 2023-11-19 ENCOUNTER — Encounter: Payer: Medicare HMO | Admitting: Rehabilitative and Restorative Service Providers"

## 2023-11-26 ENCOUNTER — Encounter: Payer: Medicare HMO | Admitting: Rehabilitative and Restorative Service Providers"

## 2023-12-01 ENCOUNTER — Ambulatory Visit: Payer: Medicare HMO | Admitting: Rehabilitative and Restorative Service Providers"

## 2023-12-01 ENCOUNTER — Encounter: Payer: Self-pay | Admitting: Rehabilitative and Restorative Service Providers"

## 2023-12-01 DIAGNOSIS — R262 Difficulty in walking, not elsewhere classified: Secondary | ICD-10-CM | POA: Diagnosis not present

## 2023-12-01 DIAGNOSIS — M6281 Muscle weakness (generalized): Secondary | ICD-10-CM

## 2023-12-01 DIAGNOSIS — M25551 Pain in right hip: Secondary | ICD-10-CM

## 2023-12-01 NOTE — Therapy (Signed)
 OUTPATIENT PHYSICAL THERAPY TREATMENT / PROGRESS NOTE   Patient Name: Cheryl Glass MRN: [id number] DOB:1951/09/25, 73 y.o., female Today's Date: 12/01/2023  Progress Note Reporting Period 10/20/2023 to 12/01/2023  See note below for Objective Data and Assessment of Progress/Goals.       END OF SESSION:  PT End of Session - 12/01/23 0917     Visit Number 4    Number of Visits 24    Date for PT Re-Evaluation 01/12/24    Authorization Type [employer] $25 copay    Authorization Time Period -01/12/2024    Authorization - Visit Number 4    Authorization - Number of Visits 12    Progress Note Due on Visit 10    PT Start Time 0921    PT Stop Time 1015    PT Time Calculation (min) 54 min    Activity Tolerance Patient tolerated treatment well    Behavior During Therapy WFL for tasks assessed/performed               Past Medical History:  Diagnosis Date   Arthritis    Eating disorder    Hyperlipidemia    Hypertension    Osteoporosis    Right patella fracture    Wrist fracture 2013   right   Past Surgical History:  Procedure Laterality Date   AUGMENTATION MAMMAPLASTY     BREAST ENHANCEMENT SURGERY     CESAREAN SECTION     EXAM UNDER ANESTHESIA WITH MANIPULATION OF KNEE Right 06/06/2020   Procedure: EXAM UNDER ANESTHESIA WITH MANIPULATION OF KNEE;  Surgeon: Eldred Manges, MD;  Location: [facility];  Service: Orthopedics;  Laterality: Right;   HARDWARE REMOVAL Right 06/06/2020   Procedure: right knee wire removal;  Surgeon: Eldred Manges, MD;  Location: [facility];  Service: Orthopedics;  Laterality: Right;   HYSTEROSCOPY     OPEN REDUCTION INTERNAL FIXATION (ORIF) DISTAL RADIAL FRACTURE Right 07/16/2023   Procedure: OPEN REDUCTION INTERNAL FIXATION (ORIF) DISTAL RADIUS FRACTURE;  Surgeon: Tarry Kos, MD;  Location: [facility];  Service: Orthopedics;  Laterality: Right;   ORIF PATELLA Right 04/20/2020   Procedure: OPEN REDUCTION INTERNAL  (ORIF) FIXATION RIGHT PATELLA;  Surgeon: Eldred Manges, MD;  Location: [facility];  Service: Orthopedics;  Laterality: Right;   TOTAL HIP ARTHROPLASTY Right 07/16/2023   Procedure: TOTAL HIP ARTHROPLASTY ANTERIOR APPROACH;  Surgeon: Tarry Kos, MD;  Location: [facility];  Service: Orthopedics;  Laterality: Right;   TOTAL HIP REVISION Right 07/31/2023   Procedure: OPEN REDUCTION INTERNAL FIXATION AND REVISION OF RIGHT PERIPROSTHETIC HIP;  Surgeon: Tarry Kos, MD;  Location: [facility];  Service: Orthopedics;  Laterality: Right;   Patient Active Problem List   Diagnosis Date Noted   History of revision of total replacement of right hip joint 07/31/2023   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) 07/30/2023   Closed subcapital fracture of neck of right femur, initial encounter (HCC) 07/16/2023   Closed fracture of right distal radius and ulna, initial encounter 07/16/2023    PCP: Sigmund Hazel MD  REFERRING PROVIDER: Cristie Hem, PA-C  REFERRING DIAG: [phone] (ICD-10-CM) - 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  Pt indicated driving every day and getting out a bit more.  Pt indicated having some muscle pressure noted with brake pedal.  Walking with cane in public but no cane at home.   PERTINENT HISTORY: PMH includes: arthritis, HLD, HTN, osteoporosis, Rt patella fx, Rt wrist fx. ORIF Rt patella 04/20/2020, wire removal 06/06/2020  PAIN:  NPRS scale: at worst in last week 2/10 Pain location: Rt thigh Pain description: tightness Aggravating factors: transfers, walking Relieving  factors: ice  PRECAUTIONS: None  WEIGHT BEARING RESTRICTIONS: No  FALLS:  Has patient fallen in last 6 months? 1. Has fear of falling.   LIVING ENVIRONMENT: Lives in: House/apartment Stairs: 2 stairs with no rail.  Has following equipment at home: [facility]  OCCUPATION: Retired  PLOF: Independent, walking for exercise, taking care of dog, total gym at home, routine exercise including squatting, planks.   PATIENT GOALS: Get back to walking on [address].   OBJECTIVE:   PATIENT SURVEYS:  12/01/2023: FOTO update:  60  10/20/2023 FOTO intake:  43  predicted:  63  COGNITION: 10/20/2023 Overall cognitive status: WFL    SENSATION: 10/20/2023 WFL  EDEMA:  10/20/2023 No specific measurements.   MUSCLE LENGTH: 10/20/2023 No specific testing today  POSTURE:  10/20/2023 WB shift to Lt in standing.  SPC in Lt hand.   PALPATION: 10/20/2023 General tenderness to tough in Rt anterior and lateral thigh.   LOWER EXTREMITY ROM:  10/20/2023:  unable to assess as Pt was not willing to lie on table today.    ROM Right 10/28/2023    Hip flexion 85 AAROM with strap    Hip extension     Hip abduction     Hip adduction     Hip internal rotation     Hip external rotation     Knee flexion     Knee extension     Ankle dorsiflexion     Ankle plantarflexion     Ankle inversion     Ankle eversion      (Blank rows = not tested)  LOWER EXTREMITY MMT:  10/20/2023: Testing limited in hip due to patient desire to not lie on table today   MMT Right 10/20/2023 Left 10/20/2023 Right 12/01/2023 Left 12/01/2023  Hip flexion 3+/5 4/5 4+/5 4+/5  Hip extension      Hip abduction      Hip adduction      Hip internal rotation      Hip external rotation      Knee flexion 4/5 5/5 4+/5 5/5  Knee extension 4/5 5/5 4+/5 5/5  Ankle dorsiflexion 5/5 5/5    Ankle plantarflexion      Ankle inversion      Ankle eversion       (Blank rows = not tested)  LOWER EXTREMITY SPECIAL TESTS:  10/20/2023 No  specific testing performed today.   FUNCTIONAL TESTS:  12/01/2023: TUG from 18 inch chair: independent 13.67 seconds 5 x sit to stand from 18 inch:  14.63 seconds s UE assist  10/20/2023 18 inch chair transfer: unable unassisted.  Lt SLS: 30 seconds Rt SLS: 8 seconds  No TUG due to patient not wanting to sit in standard chair height.   GAIT: 10/20/2023 SPC use in Lt hand with reduced stance phase, reduced toe off progression, reduced gait speed.  TODAY'S TREATMENT                                                                          DATE: 12/01/2023 Therex Nustep Lvl 5 12 mins for ROM/general aerobic Seated quad set c SLR 2 x 10   Verbal review of existing HEP.   Neuro Re-ed (neural recruitment/activation, balance improvements) Tandem stance 1 min x 1 bilateral  SLS with contralateral leg step over and back 6 inch hurdle 2 x 10 bilateral  Lateral step over 6 inch hurdle with occasional HHA x 15 each way    TherActivity (to improve progressive mobility, transfers, stairs and other activity of day) TUG x 1 independent Sit to stand to sit 18 inch chair x 5 with cues for home use (x10, 3x per day) Leg press double leg 50 lbs x 15, single leg 25 lbs x 15 bilateral  Step on over and down 4 inch step WB on Rt leg with single hand rail assist and SBA x 10  Half Flight of stairs c reciprocal gait pattern up/down.  Partially step to pattern with WB on Lt leg, partially with reciprocal pattern using Rt handrail going up and Lt going down.    TODAY'S TREATMENT                                                                          DATE: 11/09/2023 Therex Nustep Lvl 5 10.5 mins for ROM/general aerobic Supine Rt leg heel slide AROM 5 sec hold, quad set 5 sec hold x 10 each Supine bridge 2 sec hold 2 x 10   Additional time  spent in education of exercise routine and printout of new exercises.  Verbal cues throughout performance.   TherActivity Education on [employer] lift with Rt leg off ground to facilitate faucet turn on and other activity for picking up activity.  Reactive blazepod pedal pressing for driving simulation with 2 sec light time random green or red (gas/brake) with variable off time 1-3 seconds  30 seconds x 5 with 30 second breaks.  Sit to stand to sit 20 inch table height without UE assist.  Verbal cues for encouragement and techniques.  Performed x 5  TODAY'S TREATMENT                                                                          DATE: 10/28/2023 Therex Supine heelslides AAROM 5 sec X 10 Supine heelsides AAROM with foot on medium ball 5 sec X 10 Supine bridges X 10 Supine SAQ X 10 with black bolster under knees Supine hip adduction ball squeeze with black bolster under knees Seated LAQ X 10 bilat Sit to stand with  UE support from raised table height 24 inch X 5 reps Standing marches with bilat UE support X 10 bilat Sidestepping 6 feet to left and 6 feet to right X 3 round trips without UE support and with supervision Walking without assistance 6 feet forward then 6 feet backwards 3 trips each with supervision  TODAY'S TREATMENT                                                                          DATE: 10/20/2023 Therex:    HEP instruction/performance c cues for techniques, handout provided.  Trial set performed of each for comprehension and symptom assessment.  See below for exercise list  PATIENT EDUCATION:  12/01/2023 Education details: HEP update Person educated: Patient Education method: Explanation, Demonstration, Verbal cues, and Handouts Education comprehension: verbalized understanding, returned demonstration, and verbal cues required  HOME EXERCISE PROGRAM: Access Code: [email or web address] URL: [email or web address] Date: 12/01/2023 Prepared by: Chyrel Masson  Exercises - Seated Quad Set  - 2 x daily - 7 x weekly - 1 sets - 10 reps - 5 hold - Seated March  - 2 x daily - 7 x weekly - 2-3 sets - 10 reps - Seated Long Arc Quad  - 2 x daily - 7 x weekly - 2-3 sets - 10 reps - 2 hold - Seated Hip Abduction with Resistance  - 2 x daily - 7 x weekly - 3 sets - 10 reps - Tandem Stance in Corner  - 1 x daily - 7 x weekly - 1 sets - 3-5 reps - 30 hold - Supine Heel Slide  - 1-2 x daily - 7 x weekly - 1 sets - 10 reps - 5 hold - Supine Bridge  - 1-2 x daily - 7 x weekly - 2 sets - 10 reps - 2 hold - Seated Straight Leg Heel Taps  - 1-2 x daily - 7 x weekly - 3 sets - 10 reps - Sit to Stand  - 3 x daily - 7 x weekly - 1 sets - 10 reps  ASSESSMENT:  CLINICAL IMPRESSION: The patient has attended 4 visits over the course of treatment cycle.  Patient has reported overall improvement at 60% with global rating of change at quite a bit better +5.  See objective data above for updated information regarding current presentation. Pt may continue to benefit from skilled PT services to improve strength, mobility and balance for daily activity.    OBJECTIVE IMPAIRMENTS: Abnormal gait, decreased activity tolerance, decreased balance, decreased coordination, decreased endurance, decreased mobility, difficulty walking, decreased ROM, decreased strength, increased fascial restrictions, impaired perceived functional ability, increased muscle spasms, impaired flexibility, improper body mechanics, postural dysfunction, and pain.   ACTIVITY LIMITATIONS: carrying, lifting, bending, sitting, standing, squatting, sleeping, stairs, transfers, bed mobility, bathing, dressing, hygiene/grooming, and locomotion level  PARTICIPATION LIMITATIONS: meal prep, cleaning, laundry, interpersonal relationship, driving, shopping, community activity, and yard work  PERSONAL FACTORS: Past/current experiences, Time since onset of injury/illness/exacerbation, and osteoporosis, HTN,  hyperlipidemia, Rt knee patellar fracture history.   are also affecting patient's functional outcome.   REHAB POTENTIAL: Good  CLINICAL DECISION MAKING: Stable/uncomplicated  EVALUATION COMPLEXITY: Low   GOALS: Goals reviewed with patient? Yes  SHORT TERM GOALS: (target  date for Short term goals are 3 weeks 11/10/2023)   1.  Patient will demonstrate independent use of home exercise program to maintain progress from in clinic treatments.  Goal status: Met  LONG TERM GOALS: (target dates for all long term goals are 12 weeks  01/12/2024 )   1. Patient will demonstrate/report pain at worst less than or equal to 2/10 to facilitate minimal limitation in daily activity secondary to pain symptoms.  Goal status: on going 12/01/2023   2. Patient will demonstrate independent use of home exercise program to facilitate ability to maintain/progress functional gains from skilled physical therapy services.  Goal status:  on going 12/01/2023   3. Patient will demonstrate FOTO outcome > or = 63 % to indicate reduced disability due to condition.  Goal status:  on going 12/01/2023   4.  Patient will demonstrate Rt LE MMT 4/5 or greater  throughout to faciltiate usual transfers, stairs, squatting at [facility] for daily life.   Goal status:  on going 12/01/2023   5.  Patient will demonstrate independent ambulation community distances > 500 ft for walking exercise/community integration.  Goal status:  on going 12/01/2023   6.  Patient will demonstrate ascending/descending stairs with reciprocal pattern s UE assist for household entry/exit.  Goal status:  on going 12/01/2023   7.  Patient will demonstrate ability to get in and out of bed for sleeping in bed (higher bed height with step stool required).  Goal Status:  Met 12/01/2023   PLAN:  PT FREQUENCY: 1-2x/week  PT DURATION: 12 weeks  PLANNED INTERVENTIONS: Can include 91478- PT Re-evaluation, 97110-Therapeutic exercises, 97530- Therapeutic activity,  97112- Neuromuscular re-education, 97535- Self Care, 97140- Manual therapy, [phone]- Gait training, [phone]- Orthotic Fit/training, [phone]- Aquatic Therapy, [phone]- Electrical stimulation (unattended),   Patient/Family education, Balance training, Stair training, Taping, Dry Needling, Joint mobilization, Joint manipulation, Spinal manipulation, Spinal mobilization, Scar mobilization, Vestibular training, Visual/preceptual remediation/compensation, DME instructions, Cryotherapy, and Moist heat.  All performed as medically necessary.  All included unless contraindicated  PLAN FOR NEXT SESSION: Continue strengthening, balance improvements.    Chyrel Masson, PT, DPT, OCS, ATC 12/01/23  10:27 AM      Referring diagnosis? 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 (if different than referring diagnosis) M25.551 Pain in Rt hip What was this (referring dx) caused by? [x]  Surgery [x]  Fall []  Ongoing issue []  Arthritis []  Other: ____________  Laterality: [x]  Rt []  Lt []  Both  Check all possible CPT codes:  *CHOOSE 10 OR LESS*    See Planned Interventions listed in the Plan section of the Evaluation.

## 2023-12-09 NOTE — Progress Notes (Unsigned)
 Office Visit Note   Patient: Cheryl Glass           Date of Birth: [birth year]           MRN: [id number] Visit Date: 12/10/2023              Requested by:  Sigmund Hazel, MD [address] [place],  [place] [address]  PCP: Sigmund Hazel, MD   Assessment & Plan: Visit Diagnoses: No diagnosis found.  Plan: ***  Follow-Up Instructions: No follow-ups on file.   Orders:  No orders of the defined types were placed in this encounter. No orders of the defined types were placed in this encounter.    Procedures: No procedures performed   Clinical Data: No additional findings.   Subjective: No chief complaint on file.  HPI  Review of Systems   Objective: Vital Signs: LMP 07/10/2013   Physical Exam  Ortho Exam  Specialty Comments:  No specialty comments available.  Imaging: No results found.   PMFS History: Patient Active Problem List   Diagnosis Date Noted  . History of revision of total replacement of right hip joint 07/31/2023  . 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 Closed subcapital fracture of neck of right femur, initial encounter (HCC) 07/16/2023  . Closed fracture of right distal radius and ulna, initial encounter 07/16/2023   Past Medical History:  Diagnosis Date  . Arthritis   . Eating disorder   . Hyperlipidemia   . Hypertension   . Osteoporosis   . Right patella fracture   . Wrist fracture 2013   right    Family History  Problem Relation Age of Onset  . Cancer Father        pancreatic  . Diabetes Father   . Thyroid disease Sister        hypothyroid  . Osteoporosis Maternal Grandmother   . Breast cancer Neg Hx     Past Surgical History:  Procedure Laterality Date  . AUGMENTATION MAMMAPLASTY    . BREAST ENHANCEMENT SURGERY    . CESAREAN SECTION    . EXAM UNDER ANESTHESIA WITH MANIPULATION OF KNEE Right 06/06/2020   Procedure: EXAM UNDER ANESTHESIA WITH MANIPULATION OF KNEE;   Surgeon: Eldred Manges, MD;  Location: [facility];  Service: Orthopedics;  Laterality: Right;  . HARDWARE REMOVAL Right 06/06/2020   Procedure: right knee wire removal;  Surgeon: Eldred Manges, MD;  Location: [facility];  Service: Orthopedics;  Laterality: Right;  . HYSTEROSCOPY    . OPEN REDUCTION INTERNAL FIXATION (ORIF) DISTAL RADIAL FRACTURE Right 07/16/2023   Procedure: OPEN REDUCTION INTERNAL FIXATION (ORIF) DISTAL RADIUS FRACTURE;  Surgeon: Tarry Kos, MD;  [facility];  Service: Orthopedics;  Laterality: Right;  . ORIF PATELLA Right 04/20/2020   Procedure: OPEN REDUCTION INTERNAL (ORIF) FIXATION RIGHT PATELLA;  Surgeon: Eldred Manges, MD;  Location: [facility];  Service: Orthopedics;  Laterality: Right;  . TOTAL HIP ARTHROPLASTY Right 07/16/2023   Procedure: TOTAL HIP ARTHROPLASTY ANTERIOR APPROACH;  Surgeon: Tarry Kos, MD;  Location: [facility];  Service: Orthopedics;  Laterality: Right;  . TOTAL HIP REVISION Right 07/31/2023   Procedure: OPEN REDUCTION INTERNAL FIXATION AND REVISION OF RIGHT PERIPROSTHETIC HIP;  Surgeon: Tarry Kos, MD;  Location: [facility];  Service: Orthopedics;  Laterality: Right;   Social History   Occupational History  . Not on file  Tobacco Use  . Smoking status: Never  . Smokeless  tobacco: Never  Vaping Use  . Vaping status: Never Used  Substance and Sexual Activity  . Alcohol use: No    Alcohol/week: 0.0 standard drinks of alcohol  . Drug use: No  . Sexual activity: Yes    Partners: Male    Birth control/protection: Post-menopausal    Comment: husband vasectomy

## 2023-12-10 ENCOUNTER — Ambulatory Visit: Payer: Medicare HMO | Admitting: Orthopaedic Surgery

## 2023-12-10 ENCOUNTER — Other Ambulatory Visit (INDEPENDENT_AMBULATORY_CARE_PROVIDER_SITE_OTHER): Payer: Self-pay

## 2023-12-10 ENCOUNTER — Encounter: Payer: Self-pay | Admitting: Orthopaedic Surgery

## 2023-12-10 DIAGNOSIS — S52601A Unspecified fracture of lower end of right ulna, initial encounter for closed fracture: Secondary | ICD-10-CM

## 2023-12-10 DIAGNOSIS — S52501A Unspecified fracture of the lower end of right radius, initial encounter for closed fracture: Secondary | ICD-10-CM | POA: Diagnosis not present

## 2023-12-10 DIAGNOSIS — M9701XA 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, initial encounter: Secondary | ICD-10-CM | POA: Diagnosis not present

## 2023-12-15 ENCOUNTER — Ambulatory Visit: Payer: Medicare HMO | Admitting: Physical Therapy

## 2023-12-15 ENCOUNTER — Encounter: Payer: Self-pay | Admitting: Physical Therapy

## 2023-12-15 DIAGNOSIS — M25551 Pain in right hip: Secondary | ICD-10-CM | POA: Diagnosis not present

## 2023-12-15 DIAGNOSIS — M6281 Muscle weakness (generalized): Secondary | ICD-10-CM | POA: Diagnosis not present

## 2023-12-15 DIAGNOSIS — R262 Difficulty in walking, not elsewhere classified: Secondary | ICD-10-CM

## 2023-12-15 NOTE — Therapy (Signed)
 OUTPATIENT PHYSICAL THERAPY TREATMENT    Patient Name: Cheryl Glass MRN: [id number] DOB:1951-02-25, 73 y.o., female Today's Date: 12/15/2023   END OF SESSION:  PT End of Session - 12/15/23 1031     Visit Number 5    Number of Visits 24    Date for PT Re-Evaluation 01/12/24    Authorization Type [employer] $25 copay    Authorization Time Period -01/12/2024    Authorization - Visit Number 5    Authorization - Number of Visits 12    Progress Note Due on Visit 10    PT Start Time 1002    PT Stop Time 1042    PT Time Calculation (min) 40 min    Activity Tolerance Patient tolerated treatment well    Behavior During Therapy WFL for tasks assessed/performed               Past Medical History:  Diagnosis Date   Arthritis    Eating disorder    Hyperlipidemia    Hypertension    Osteoporosis    Right patella fracture    Wrist fracture 2013   right   Past Surgical History:  Procedure Laterality Date   AUGMENTATION MAMMAPLASTY     BREAST ENHANCEMENT SURGERY     CESAREAN SECTION     EXAM UNDER ANESTHESIA WITH MANIPULATION OF KNEE Right 06/06/2020   Procedure: EXAM UNDER ANESTHESIA WITH MANIPULATION OF KNEE;  Surgeon: Eldred Manges, MD;  Location: [facility];  Service: Orthopedics;  Laterality: Right;   HARDWARE REMOVAL Right 06/06/2020   Procedure: right knee wire removal;  Surgeon: Eldred Manges, MD;  Location: [facility];  Service: Orthopedics;  Laterality: Right;   HYSTEROSCOPY     OPEN REDUCTION INTERNAL FIXATION (ORIF) DISTAL RADIAL FRACTURE Right 07/16/2023   Procedure: OPEN REDUCTION INTERNAL FIXATION (ORIF) DISTAL RADIUS FRACTURE;  Surgeon: Tarry Kos, MD;  Location: [facility];  Service: Orthopedics;  Laterality: Right;   ORIF PATELLA Right 04/20/2020   Procedure: OPEN REDUCTION INTERNAL (ORIF) FIXATION RIGHT PATELLA;  Surgeon: Eldred Manges, MD;  Location: [facility];  Service: Orthopedics;  Laterality: Right;   TOTAL HIP ARTHROPLASTY Right  07/16/2023   Procedure: TOTAL HIP ARTHROPLASTY ANTERIOR APPROACH;  Surgeon: Tarry Kos, MD;  Location: [facility];  Service: Orthopedics;  Laterality: Right;   TOTAL HIP REVISION Right 07/31/2023   Procedure: OPEN REDUCTION INTERNAL FIXATION AND REVISION OF RIGHT PERIPROSTHETIC HIP;  Surgeon: Tarry Kos, MD;  Location: [facility];  Service: Orthopedics;  Laterality: Right;   Patient Active Problem List   Diagnosis Date Noted   History of revision of total replacement of right hip joint 07/31/2023   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) 07/30/2023   Closed subcapital fracture of neck of right femur, initial encounter (HCC) 07/16/2023   Closed fracture of right distal radius and ulna, initial encounter 07/16/2023    PCP: Sigmund Hazel MD  REFERRING PROVIDER: Cristie Hem, PA-C  REFERRING DIAG: 3171000306.01XA (ICD-10-CM) - 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 She says MD had good report for her. She has missed  some therapy after the passing of her husband.  PERTINENT HISTORY: PMH includes: arthritis, HLD, HTN, osteoporosis, Rt patella fx, Rt wrist fx. ORIF Rt patella 04/20/2020, wire removal 06/06/2020  PAIN:  NPRS scale: 1/10, but does have stiffness Pain location: Rt thigh Pain description: tightness Aggravating factors: transfers, walking Relieving factors: ice  PRECAUTIONS: None  WEIGHT BEARING RESTRICTIONS: No  FALLS:  Has patient fallen in last 6 months? 1. Has fear of falling.   LIVING ENVIRONMENT: Lives in:  House/apartment Stairs: 2 stairs with no rail.  Has following equipment at home: [facility]  OCCUPATION: Retired  PLOF: Independent, walking for exercise, taking care of dog, total gym at home, routine exercise including squatting, planks.   PATIENT GOALS: Get back to walking on green [address].   OBJECTIVE:   PATIENT SURVEYS:  12/01/2023: FOTO update:  60  10/20/2023 FOTO intake:  43  predicted:  63  COGNITION: 10/20/2023 Overall cognitive status: WFL    SENSATION: 10/20/2023 WFL  EDEMA:  10/20/2023 No specific measurements.   MUSCLE LENGTH: 10/20/2023 No specific testing today  POSTURE:  10/20/2023 WB shift to Lt in standing.  SPC in Lt hand.   PALPATION: 10/20/2023 General tenderness to tough in Rt anterior and lateral thigh.   LOWER EXTREMITY ROM:  10/20/2023:  unable to assess as Pt was not willing to lie on table today.    ROM Right 10/28/2023    Hip flexion 85 AAROM with strap    Hip extension     Hip abduction     Hip adduction     Hip internal rotation     Hip external rotation     Knee flexion     Knee extension     Ankle dorsiflexion     Ankle plantarflexion     Ankle inversion     Ankle eversion      (Blank rows = not tested)  LOWER EXTREMITY MMT:  10/20/2023: Testing limited in hip due to patient desire to not lie on table today   MMT Right 10/20/2023 Left 10/20/2023 Right 12/01/2023 Left 12/01/2023  Hip flexion 3+/5 4/5 4+/5 4+/5  Hip extension      Hip abduction      Hip adduction      Hip internal rotation      Hip external rotation      Knee flexion 4/5 5/5 4+/5 5/5  Knee extension 4/5 5/5 4+/5 5/5  Ankle dorsiflexion 5/5 5/5    Ankle plantarflexion      Ankle inversion      Ankle eversion       (Blank rows = not tested)  LOWER EXTREMITY SPECIAL TESTS:  10/20/2023 No specific testing performed today.   FUNCTIONAL TESTS:  12/01/2023: TUG from 18 inch chair: independent 13.67 seconds 5 x sit to stand from 18 inch:  14.63 seconds s UE  assist  10/20/2023 18 inch chair transfer: unable unassisted.  Lt SLS: 30 seconds Rt SLS: 8 seconds  No TUG due to patient not wanting to sit in standard chair height.   GAIT: 10/20/2023 SPC use in Lt hand with reduced stance phase, reduced toe off progression, reduced gait speed.  TODAY'S TREATMENT                                                                          DATE:  12/21/2023 Therex Nustep Lvl 5 10 mins for ROM/general aerobic Seated hip flexion AAROM stretch 5 sec hold 2X10 Seated p ball roll outs for lumbar flexion/hip flexion stretch 5 sec 2X10   Neuro Re-ed (neural recruitment/activation, balance improvements) Tandem walking 5 round trips in bars, intermittent UE support Sidestepping on foam beam X 5 round trips in bars without UE support SLS 20 sec X 3 bilat, intermittent UE support    TherActivity (to improve progressive mobility, transfers, stairs and other activity of day) Sit to stand to sit 18 inch chair 2 X 5 reps without UE support Leg press double leg 56 lbs 2X10 , single leg 25 lbs  2X10  bilateral  Lifting from waist to 10 inch from floor (8 inch block plus 2 inch block) using 5# kettlebell 2X5 reps.   12/01/2023 Therex Nustep Lvl 5 12 mins for ROM/general aerobic Seated quad set c SLR 2 x 10   Verbal review of existing HEP.   Neuro Re-ed (neural recruitment/activation, balance improvements) Tandem stance 1 min x 1 bilateral  SLS with contralateral leg step over and back 6 inch hurdle 2 x 10 bilateral  Lateral step over 6 inch hurdle with occasional HHA x 15 each way    TherActivity (to improve progressive mobility, transfers, stairs and other activity of day) TUG x 1 independent Sit to stand to sit 18 inch chair x 5 with cues for home use (x10, 3x per day) Leg press double leg 50  lbs x 15, single leg 25 lbs x 15 bilateral  Step on over and down 4 inch step WB on Rt leg with single hand rail assist and SBA x 10  Half Flight of stairs c reciprocal gait pattern up/down.  Partially step to pattern with WB on Lt leg, partially with reciprocal pattern using Rt handrail going up and Lt going down.    TODAY'S TREATMENT                                                                          DATE: 11/09/2023 Therex Nustep Lvl 5 10.5 mins for ROM/general aerobic Supine Rt leg heel slide AROM 5 sec hold, quad set 5 sec hold x 10 each Supine bridge 2 sec hold 2 x 10   Additional time spent in education of exercise routine and printout of new exercises.  Verbal cues throughout performance.   TherActivity Education on [employer] lift with Rt leg off ground to facilitate faucet turn on and other activity for picking up activity.  Reactive blazepod pedal pressing for driving simulation with 2 sec light time random green or red (gas/brake) with variable off time 1-3 seconds  30 seconds x 5 with 30 second breaks.  Sit to stand to sit 20 inch table height without UE  assist.  Verbal cues for encouragement and techniques.  Performed x 5  TODAY'S TREATMENT                                                                          DATE: 10/28/2023 Therex Supine heelslides AAROM 5 sec X 10 Supine heelsides AAROM with foot on medium ball 5 sec X 10 Supine bridges X 10 Supine SAQ X 10 with black bolster under knees Supine hip adduction ball squeeze with black bolster under knees Seated LAQ X 10 bilat Sit to stand with UE support from raised table height 24 inch X 5 reps Standing marches with bilat UE support X 10 bilat Sidestepping 6 feet to left and 6 feet to right X 3 round trips without UE support and with supervision Walking without assistance 6 feet forward then 6 feet backwards 3 trips each with supervision   PATIENT EDUCATION:  12/01/2023 Education details: HEP update Person  educated: Patient Education method: Explanation, Demonstration, Verbal cues, and Handouts Education comprehension: verbalized understanding, returned demonstration, and verbal cues required  HOME EXERCISE PROGRAM: Access Code: [email or web address] URL: [email or web address] Date: 12/01/2023 Prepared by: Chyrel Masson  Exercises - Seated Quad Set  - 2 x daily - 7 x weekly - 1 sets - 10 reps - 5 hold - Seated March  - 2 x daily - 7 x weekly - 2-3 sets - 10 reps - Seated Long Arc Quad  - 2 x daily - 7 x weekly - 2-3 sets - 10 reps - 2 hold - Seated Hip Abduction with Resistance  - 2 x daily - 7 x weekly - 3 sets - 10 reps - Tandem Stance in Corner  - 1 x daily - 7 x weekly - 1 sets - 3-5 reps - 30 hold - Supine Heel Slide  - 1-2 x daily - 7 x weekly - 1 sets - 10 reps - 5 hold - Supine Bridge  - 1-2 x daily - 7 x weekly - 2 sets - 10 reps - 2 hold - Seated Straight Leg Heel Taps  - 1-2 x daily - 7 x weekly - 3 sets - 10 reps - Sit to Stand  - 3 x daily - 7 x weekly - 1 sets - 10 reps  ASSESSMENT:  CLINICAL IMPRESSION: Overall had good report from doctor and latest Xray on 12/10/23 show "pelvis show stable right total hip arthroplasty with revision stem and greater trochanter plate and screws with evidence of healing of the periprosthetic fracture.  There is no subsidence or loosening of the stem. " Her function has improved with ambulation and sit to stand ability however does still lack some hip strength, hip ROM, and confidence for certain activities that we will continue to work to progress with PT.    OBJECTIVE IMPAIRMENTS: Abnormal gait, decreased activity tolerance, decreased balance, decreased coordination, decreased endurance, decreased mobility, difficulty walking, decreased ROM, decreased strength, increased fascial restrictions, impaired perceived functional ability, increased muscle spasms, impaired flexibility, improper body mechanics, postural dysfunction, and pain.    ACTIVITY LIMITATIONS: carrying, lifting, bending, sitting, standing, squatting, sleeping, stairs, transfers, bed mobility, bathing, dressing, hygiene/grooming, and locomotion level  PARTICIPATION LIMITATIONS: meal prep, cleaning, laundry, interpersonal relationship, driving, shopping, community  activity, and yard work  PERSONAL FACTORS: Past/current experiences, Time since onset of injury/illness/exacerbation, and osteoporosis, HTN, hyperlipidemia, Rt knee patellar fracture history.   are also affecting patient's functional outcome.   REHAB POTENTIAL: Good  CLINICAL DECISION MAKING: Stable/uncomplicated  EVALUATION COMPLEXITY: Low   GOALS: Goals reviewed with patient? Yes  SHORT TERM GOALS: (target date for Short term goals are 3 weeks 11/10/2023)   1.  Patient will demonstrate independent use of home exercise program to maintain progress from in clinic treatments.  Goal status: Met  LONG TERM GOALS: (target dates for all long term goals are 12 weeks  01/12/2024 )   1. Patient will demonstrate/report pain at worst less than or equal to 2/10 to facilitate minimal limitation in daily activity secondary to pain symptoms.  Goal status: on going 12/01/2023   2. Patient will demonstrate independent use of home exercise program to facilitate ability to maintain/progress functional gains from skilled physical therapy services.  Goal status:  on going 12/01/2023   3. Patient will demonstrate FOTO outcome > or = 63 % to indicate reduced disability due to condition.  Goal status:  on going 12/01/2023   4.  Patient will demonstrate Rt LE MMT 4/5 or greater  throughout to faciltiate usual transfers, stairs, squatting at [facility] for daily life.   Goal status:  on going 12/01/2023   5.  Patient will demonstrate independent ambulation community distances > 500 ft for walking exercise/community integration.  Goal status:  on going 12/01/2023   6.  Patient will demonstrate ascending/descending  stairs with reciprocal pattern s UE assist for household entry/exit.  Goal status:  on going 12/01/2023   7.  Patient will demonstrate ability to get in and out of bed for sleeping in bed (higher bed height with step stool required).  Goal Status:  Met 12/01/2023   PLAN:  PT FREQUENCY: 1-2x/week  PT DURATION: 12 weeks  PLANNED INTERVENTIONS: Can include 16109- PT Re-evaluation, 97110-Therapeutic exercises, 97530- Therapeutic activity, 97112- Neuromuscular re-education, 97535- Self Care, 97140- Manual therapy, [phone]- Gait training, [phone]- Orthotic Fit/training, [phone]- Aquatic Therapy, [phone]- Electrical stimulation (unattended),   Patient/Family education, Balance training, Stair training, Taping, Dry Needling, Joint mobilization, Joint manipulation, Spinal manipulation, Spinal mobilization, Scar mobilization, Vestibular training, Visual/preceptual remediation/compensation, DME instructions, Cryotherapy, and Moist heat.  All performed as medically necessary.  All included unless contraindicated  PLAN FOR NEXT SESSION: Continue strengthening, balance improvements., hip ROM   Ivery Quale, PT, DPT 12/15/23 10:32 AM       Referring diagnosis? 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 (if different than referring diagnosis) M25.551 Pain in Rt hip What was this (referring dx) caused by? [x]  Surgery [x]  Fall []  Ongoing issue []  Arthritis []  Other: ____________  Laterality: [x]  Rt []  Lt []  Both  Check all possible CPT codes:  *CHOOSE 10 OR LESS*    See Planned Interventions listed in the Plan section of the Evaluation.

## 2023-12-17 ENCOUNTER — Encounter: Payer: Self-pay | Admitting: Physical Therapy

## 2023-12-17 ENCOUNTER — Ambulatory Visit: Payer: Medicare HMO | Admitting: Physical Therapy

## 2023-12-17 DIAGNOSIS — R262 Difficulty in walking, not elsewhere classified: Secondary | ICD-10-CM

## 2023-12-17 DIAGNOSIS — M6281 Muscle weakness (generalized): Secondary | ICD-10-CM

## 2023-12-17 DIAGNOSIS — M25551 Pain in right hip: Secondary | ICD-10-CM

## 2023-12-17 NOTE — Therapy (Signed)
 OUTPATIENT PHYSICAL THERAPY TREATMENT    Patient Name: Cheryl Glass MRN: [id number] DOB:March 22, 1951, 73 y.o., female Today's Date: 12/17/2023   END OF SESSION:  PT End of Session - 12/17/23 1058     Visit Number 6    Number of Visits 24    Date for PT Re-Evaluation 01/12/24    Authorization Type [employer] $25 copay    Authorization Time Period -01/12/2024    Authorization - Visit Number 6    Authorization - Number of Visits 12    Progress Note Due on Visit 10    PT Start Time 1058    PT Stop Time 1138    PT Time Calculation (min) 40 min    Activity Tolerance Patient tolerated treatment well    Behavior During Therapy WFL for tasks assessed/performed               Past Medical History:  Diagnosis Date   Arthritis    Eating disorder    Hyperlipidemia    Hypertension    Osteoporosis    Right patella fracture    Wrist fracture 2013   right   Past Surgical History:  Procedure Laterality Date   AUGMENTATION MAMMAPLASTY     BREAST ENHANCEMENT SURGERY     CESAREAN SECTION     EXAM UNDER ANESTHESIA WITH MANIPULATION OF KNEE Right 06/06/2020   Procedure: EXAM UNDER ANESTHESIA WITH MANIPULATION OF KNEE;  Surgeon: Eldred Manges, MD;  Location: [facility];  Service: Orthopedics;  Laterality: Right;   HARDWARE REMOVAL Right 06/06/2020   Procedure: right knee wire removal;  Surgeon: Eldred Manges, MD;  Location: [facility];  Service: Orthopedics;  Laterality: Right;   HYSTEROSCOPY     OPEN REDUCTION INTERNAL FIXATION (ORIF) DISTAL RADIAL FRACTURE Right 07/16/2023   Procedure: OPEN REDUCTION INTERNAL FIXATION (ORIF) DISTAL RADIUS FRACTURE;  Surgeon: Tarry Kos, MD;  Location: [facility];  Service: Orthopedics;  Laterality: Right;   ORIF PATELLA Right 04/20/2020   Procedure: OPEN REDUCTION INTERNAL (ORIF) FIXATION RIGHT PATELLA;  Surgeon: Eldred Manges, MD;  Location: [facility];  Service: Orthopedics;  Laterality: Right;   TOTAL HIP ARTHROPLASTY Right  07/16/2023   Procedure: TOTAL HIP ARTHROPLASTY ANTERIOR APPROACH;  Surgeon: Tarry Kos, MD;  Location: [facility];  Service: Orthopedics;  Laterality: Right;   TOTAL HIP REVISION Right 07/31/2023   Procedure: OPEN REDUCTION INTERNAL FIXATION AND REVISION OF RIGHT PERIPROSTHETIC HIP;  Surgeon: Tarry Kos, MD;  Location: [facility];  Service: Orthopedics;  Laterality: Right;   Patient Active Problem List   Diagnosis Date Noted   History of revision of total replacement of right hip joint 07/31/2023   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) 07/30/2023   Closed subcapital fracture of neck of right femur, initial encounter (HCC) 07/16/2023   Closed fracture of right distal radius and ulna, initial encounter 07/16/2023    PCP: Sigmund Hazel MD  REFERRING PROVIDER: Cristie Hem, PA-C  REFERRING DIAG: [phone] (ICD-10-CM) - 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 ORIF Rt patella 04/20/2020, wire removal 06/06/2020  PAIN:  NPRS scale: 1/10, but does have stiffness Pain location: Rt thigh Pain description: tightness Aggravating factors: transfers, walking Relieving factors: ice  PRECAUTIONS: None  WEIGHT BEARING RESTRICTIONS: No  FALLS:  Has patient fallen in last 6 months? 1. Has fear of falling.   LIVING ENVIRONMENT: Lives in: House/apartment Stairs: 2 stairs with no rail.  Has following equipment at home: [facility]  OCCUPATION: Retired  PLOF:  Independent, walking for exercise, taking care of dog, total gym at home, routine exercise including squatting, planks.   PATIENT GOALS: Get back to walking on [address].   OBJECTIVE:   PATIENT SURVEYS:  12/01/2023: FOTO update:  60  10/20/2023 FOTO intake:  43  predicted:  63  COGNITION: 10/20/2023 Overall cognitive status: WFL    SENSATION: 10/20/2023 WFL  EDEMA:  10/20/2023 No specific measurements.   MUSCLE LENGTH: 10/20/2023 No specific testing today  POSTURE:  10/20/2023 WB shift to Lt in standing.  SPC in Lt hand.   PALPATION: 10/20/2023 General tenderness to tough in Rt anterior and lateral thigh.   LOWER EXTREMITY ROM:  10/20/2023:  unable to assess as Pt was not willing to lie on table today.    ROM Right 10/28/2023    Hip flexion 85 AAROM with strap    Hip extension     Hip abduction     Hip adduction     Hip internal rotation     Hip external rotation     Knee flexion     Knee extension     Ankle dorsiflexion     Ankle plantarflexion     Ankle inversion     Ankle eversion      (Blank rows = not tested)  LOWER EXTREMITY MMT:  10/20/2023: Testing limited in hip due to patient desire to not lie on table today   MMT Right 10/20/2023 Left 10/20/2023 Right 12/01/2023 Left 12/01/2023  Hip flexion 3+/5 4/5 4+/5 4+/5  Hip extension      Hip abduction      Hip adduction      Hip internal rotation      Hip external rotation      Knee flexion 4/5 5/5 4+/5 5/5  Knee extension 4/5 5/5 4+/5 5/5  Ankle dorsiflexion 5/5 5/5    Ankle plantarflexion      Ankle inversion      Ankle eversion       (Blank rows = not tested)  LOWER EXTREMITY SPECIAL TESTS:  10/20/2023 No specific testing performed today.   FUNCTIONAL TESTS:  12/01/2023: TUG from 18 inch chair: independent 13.67 seconds 5 x sit to stand from 18 inch:  14.63 seconds s UE assist  10/20/2023 18 inch chair transfer: unable unassisted.  Lt SLS: 30 seconds Rt SLS: 8 seconds  No TUG due to  patient not wanting to sit in standard chair height.   GAIT: 10/20/2023 SPC use in Lt hand with reduced stance phase, reduced toe off progression, reduced gait speed.  TODAY'S TREATMENT                                                                          DATE:  12/17/2023 Therex Nustep Lvl 5 10 mins for ROM/general aerobic Seated hip flexion AAROM stretch 5 sec hold 2X10 Seated p ball roll outs for lumbar flexion/hip flexion stretch 5 sec 2X10   Neuro Re-ed (neural recruitment/activation, balance improvements) Tandem walking 5 round trips in bars, intermittent UE support Sidestepping on foam beam X 5 round trips in bars without UE support SLS 20 sec X 3 bilat, intermittent UE support    TherActivity (to improve progressive mobility, transfers, stairs and other activity of day) Leg press into max hip flexion tolerated, double leg 56 lbs 2X10 , single leg 25 lbs  2X10  bilateral  Lifting from waist to 10 inch from floor (8 inch block plus 2 inch block) using 5# kettlebell X5 reps. Then progressed to doing this from 8 inch block only 5# 2X10 TRX squats 2X10  12/15/2023 Therex Nustep Lvl 5 10 mins for ROM/general aerobic Seated hip flexion AAROM stretch 5 sec hold 2X10 Seated p ball roll outs for lumbar flexion/hip flexion stretch 5 sec 2X10   Neuro Re-ed (neural recruitment/activation, balance improvements) Tandem walking 5 round trips in bars, intermittent UE support Sidestepping on foam beam X 5 round trips in bars without UE support SLS 20 sec X 3 bilat, intermittent UE support    TherActivity (to improve progressive mobility, transfers, stairs and other activity of day) Sit to stand to sit 18 inch chair 2 X 5 reps without UE support Leg press double leg 56 lbs 2X10 , single leg 25 lbs  2X10  bilateral  Lifting  from waist to 10 inch from floor (8 inch block plus 2 inch block) using 5# kettlebell 2X5 reps.   12/01/2023 Therex Nustep Lvl 5 12 mins for ROM/general aerobic Seated quad set c SLR 2 x 10   Verbal review of existing HEP.   Neuro Re-ed (neural recruitment/activation, balance improvements) Tandem stance 1 min x 1 bilateral  SLS with contralateral leg step over and back 6 inch hurdle 2 x 10 bilateral  Lateral step over 6 inch hurdle with occasional HHA x 15 each way    TherActivity (to improve progressive mobility, transfers, stairs and other activity of day) TUG x 1 independent Sit to stand to sit 18 inch chair x 5 with cues for home use (x10, 3x per day) Leg press double leg 50 lbs x 15, single leg 25 lbs x 15 bilateral  Step on over and down 4 inch step WB on Rt leg with single hand rail assist and SBA x 10  Half Flight of stairs c reciprocal gait pattern up/down.  Partially step to pattern with WB on Lt leg, partially with reciprocal pattern using Rt handrail going up and Lt going down.    TODAY'S TREATMENT  DATE: 11/09/2023 Therex Nustep Lvl 5 10.5 mins for ROM/general aerobic Supine Rt leg heel slide AROM 5 sec hold, quad set 5 sec hold x 10 each Supine bridge 2 sec hold 2 x 10   Additional time spent in education of exercise routine and printout of new exercises.  Verbal cues throughout performance.   TherActivity Education on [employer] lift with Rt leg off ground to facilitate faucet turn on and other activity for picking up activity.  Reactive blazepod pedal pressing for driving simulation with 2 sec light time random green or red (gas/brake) with variable off time 1-3 seconds  30 seconds x 5 with 30 second breaks.  Sit to stand to sit 20 inch table height without UE assist.  Verbal cues for encouragement and techniques.  Performed x 5  TODAY'S TREATMENT                                                                           DATE: 10/28/2023 Therex Supine heelslides AAROM 5 sec X 10 Supine heelsides AAROM with foot on medium ball 5 sec X 10 Supine bridges X 10 Supine SAQ X 10 with black bolster under knees Supine hip adduction ball squeeze with black bolster under knees Seated LAQ X 10 bilat Sit to stand with UE support from raised table height 24 inch X 5 reps Standing marches with bilat UE support X 10 bilat Sidestepping 6 feet to left and 6 feet to right X 3 round trips without UE support and with supervision Walking without assistance 6 feet forward then 6 feet backwards 3 trips each with supervision   PATIENT EDUCATION:  12/01/2023 Education details: HEP update Person educated: Patient Education method: Explanation, Demonstration, Verbal cues, and Handouts Education comprehension: verbalized understanding, returned demonstration, and verbal cues required  HOME EXERCISE PROGRAM: Access Code: [email or web address] URL: [email or web address] Date: 12/01/2023 Prepared by: Chyrel Masson  Exercises - Seated Quad Set  - 2 x daily - 7 x weekly - 1 sets - 10 reps - 5 hold - Seated March  - 2 x daily - 7 x weekly - 2-3 sets - 10 reps - Seated Long Arc Quad  - 2 x daily - 7 x weekly - 2-3 sets - 10 reps - 2 hold - Seated Hip Abduction with Resistance  - 2 x daily - 7 x weekly - 3 sets - 10 reps - Tandem Stance in Corner  - 1 x daily - 7 x weekly - 1 sets - 3-5 reps - 30 hold - Supine Heel Slide  - 1-2 x daily - 7 x weekly - 1 sets - 10 reps - 5 hold - Supine Bridge  - 1-2 x daily - 7 x weekly - 2 sets - 10 reps - 2 hold - Seated Straight Leg Heel Taps  - 1-2 x daily - 7 x weekly - 3 sets - 10 reps - Sit to Stand  - 3 x daily - 7 x weekly - 1 sets - 10 reps  ASSESSMENT:  CLINICAL IMPRESSION: Overall is improving with PT, showing improved hip ROM and strength but still with limitations that we will work to improve with PT. I did recommend TRX or other version of suspension trainer for  home as there are many exercises for ROM and strength she can do with it.   OBJECTIVE IMPAIRMENTS: Abnormal gait, decreased activity tolerance, decreased balance, decreased coordination, decreased endurance, decreased mobility, difficulty walking, decreased ROM, decreased strength, increased fascial restrictions, impaired perceived functional ability, increased muscle spasms, impaired flexibility, improper body mechanics, postural dysfunction, and pain.   ACTIVITY LIMITATIONS: carrying, lifting, bending, sitting, standing, squatting, sleeping, stairs, transfers, bed mobility, bathing, dressing, hygiene/grooming, and locomotion level  PARTICIPATION LIMITATIONS: meal prep, cleaning, laundry, interpersonal relationship, driving, shopping, community activity, and yard work  PERSONAL FACTORS: Past/current experiences, Time since onset of injury/illness/exacerbation, and osteoporosis, HTN, hyperlipidemia, Rt knee patellar fracture history.   are also affecting patient's functional outcome.   REHAB POTENTIAL: Good  CLINICAL DECISION MAKING: Stable/uncomplicated  EVALUATION COMPLEXITY: Low   GOALS: Goals reviewed with patient? Yes  SHORT TERM GOALS: (target date for Short term goals are 3 weeks 11/10/2023)   1.  Patient will demonstrate independent use of home exercise program to maintain progress from in clinic treatments.  Goal status: Met  LONG TERM GOALS: (target dates for all long term goals are 12 weeks  01/12/2024 )   1. Patient will demonstrate/report pain at worst less than or equal to 2/10 to facilitate minimal limitation in daily activity secondary to pain symptoms.  Goal status: on going 12/01/2023   2. Patient will demonstrate independent use of home exercise program to facilitate ability to maintain/progress functional gains from skilled physical therapy services.  Goal status:  on going 12/01/2023   3. Patient will demonstrate FOTO outcome > or = 63 % to indicate reduced  disability due to condition.  Goal status:  on going 12/01/2023   4.  Patient will demonstrate Rt LE MMT 4/5 or greater  throughout to faciltiate usual transfers, stairs, squatting at [facility] for daily life.   Goal status:  on going 12/01/2023   5.  Patient will demonstrate independent ambulation community distances > 500 ft for walking exercise/community integration.  Goal status:  on going 12/01/2023   6.  Patient will demonstrate ascending/descending stairs with reciprocal pattern s UE assist for household entry/exit.  Goal status:  on going 12/01/2023   7.  Patient will demonstrate ability to get in and out of bed for sleeping in bed (higher bed height with step stool required).  Goal Status:  Met 12/01/2023   PLAN:  PT FREQUENCY: 1-2x/week  PT DURATION: 12 weeks  PLANNED INTERVENTIONS: Can include 11914- PT Re-evaluation, 97110-Therapeutic exercises, 97530- Therapeutic activity, 97112- Neuromuscular re-education, 97535- Self Care, 97140- Manual therapy, [phone]- Gait training, [phone]- Orthotic Fit/training, [phone]- Aquatic Therapy, [phone]- Electrical stimulation (unattended),   Patient/Family education, Balance training, Stair training, Taping, Dry Needling, Joint mobilization, Joint manipulation, Spinal manipulation, Spinal mobilization, Scar mobilization, Vestibular training, Visual/preceptual remediation/compensation, DME instructions, Cryotherapy, and Moist heat.  All performed as medically necessary.  All included unless contraindicated  PLAN FOR NEXT SESSION: Continue strengthening, balance improvements., hip ROM   Ivery Quale, PT, DPT 12/17/23 11:51 AM       Referring diagnosis? 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 (if different than referring diagnosis) M25.551 Pain in Rt hip What was this (referring dx) caused by? [x]  Surgery [x]  Fall []  Ongoing issue []  Arthritis []   Other: ____________  Laterality: [x]  Rt []  Lt []  Both  Check all possible CPT codes:  *CHOOSE 10 OR LESS*    See Planned Interventions listed in the Plan section of the  Evaluation.

## 2023-12-22 ENCOUNTER — Ambulatory Visit: Payer: Medicare HMO | Admitting: Rehabilitative and Restorative Service Providers"

## 2023-12-22 ENCOUNTER — Encounter: Payer: Self-pay | Admitting: Rehabilitative and Restorative Service Providers"

## 2023-12-22 DIAGNOSIS — M6281 Muscle weakness (generalized): Secondary | ICD-10-CM | POA: Diagnosis not present

## 2023-12-22 DIAGNOSIS — R262 Difficulty in walking, not elsewhere classified: Secondary | ICD-10-CM | POA: Diagnosis not present

## 2023-12-22 DIAGNOSIS — M25551 Pain in right hip: Secondary | ICD-10-CM

## 2023-12-22 NOTE — Therapy (Signed)
 OUTPATIENT PHYSICAL THERAPY TREATMENT    Patient Name: Cheryl Glass MRN: [id number] DOB:10-13-1950, 73 y.o., female Today's Date: 12/22/2023   END OF SESSION:  PT End of Session - 12/22/23 0926     Visit Number 7    Number of Visits 24    Date for PT Re-Evaluation 01/12/24    Authorization Type [employer] $25 copay    Authorization Time Period -01/12/2024    Authorization - Visit Number 7    Authorization - Number of Visits 12    Progress Note Due on Visit 10    PT Start Time 0922    PT Stop Time 1015    PT Time Calculation (min) 53 min    Activity Tolerance Patient tolerated treatment well    Behavior During Therapy [facility] for tasks assessed/performed                Past Medical History:  Diagnosis Date   Arthritis    Eating disorder    Hyperlipidemia    Hypertension    Osteoporosis    Right patella fracture    Wrist fracture 2013   right   Past Surgical History:  Procedure Laterality Date   AUGMENTATION MAMMAPLASTY     BREAST ENHANCEMENT SURGERY     CESAREAN SECTION     EXAM UNDER ANESTHESIA WITH MANIPULATION OF KNEE Right 06/06/2020   Procedure: EXAM UNDER ANESTHESIA WITH MANIPULATION OF KNEE;  Surgeon: Eldred Manges, MD;  Location: [facility];  Service: Orthopedics;  Laterality: Right;   HARDWARE REMOVAL Right 06/06/2020   Procedure: right knee wire removal;  Surgeon: Eldred Manges, MD;  Location: [facility];  Service: Orthopedics;  Laterality: Right;   HYSTEROSCOPY     OPEN REDUCTION INTERNAL FIXATION (ORIF) DISTAL RADIAL FRACTURE Right 07/16/2023   Procedure: OPEN REDUCTION INTERNAL FIXATION (ORIF) DISTAL RADIUS FRACTURE;  Surgeon: Tarry Kos, MD;  Location: [facility];  Service: Orthopedics;  Laterality: Right;   ORIF PATELLA Right 04/20/2020   Procedure: OPEN REDUCTION INTERNAL (ORIF) FIXATION RIGHT PATELLA;  Surgeon: Eldred Manges, MD;  Location: [facility];  Service: Orthopedics;  Laterality: Right;   TOTAL HIP ARTHROPLASTY  Right 07/16/2023   Procedure: TOTAL HIP ARTHROPLASTY ANTERIOR APPROACH;  Surgeon: Tarry Kos, MD;  Location: [facility];  Service: Orthopedics;  Laterality: Right;   TOTAL HIP REVISION Right 07/31/2023   Procedure: OPEN REDUCTION INTERNAL FIXATION AND REVISION OF RIGHT PERIPROSTHETIC HIP;  Surgeon: Tarry Kos, MD;  Location: [facility];  Service: Orthopedics;  Laterality: Right;   Patient Active Problem List   Diagnosis Date Noted   History of revision of total replacement of right hip joint 07/31/2023   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) 07/30/2023   Closed subcapital fracture of neck of right femur, initial encounter (HCC) 07/16/2023   Closed fracture of right distal radius and ulna, initial encounter 07/16/2023    PCP: Sigmund Hazel MD  REFERRING PROVIDER: Cristie Hem, PA-C  REFERRING DIAG: [phone] (ICD-10-CM) - 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  Pt indicated no pain to talk about.    PERTINENT HISTORY: PMH includes: arthritis, HLD, HTN, osteoporosis, Rt  patella fx, Rt wrist fx. ORIF Rt patella 04/20/2020, wire removal 06/06/2020  PAIN:  NPRS scale: 1/10, but does have stiffness Pain location: Rt thigh Pain description: tightness Aggravating factors: transfers, walking Relieving factors: ice  PRECAUTIONS: None  WEIGHT BEARING RESTRICTIONS: No  FALLS:  Has patient fallen in last 6 months? 1. Has fear of falling.   LIVING ENVIRONMENT: Lives in: House/apartment Stairs: 2 stairs with no rail.  Has following equipment at home: [facility]  OCCUPATION:  Retired  PLOF: Independent, walking for exercise, taking care of dog, total gym at home, routine exercise including squatting, planks.   PATIENT GOALS: Get back to walking on [address].   OBJECTIVE:   PATIENT SURVEYS:  12/01/2023: FOTO update:  60  10/20/2023 FOTO intake:  43  predicted:  63  COGNITION: 10/20/2023 Overall cognitive status: WFL    SENSATION: 10/20/2023 WFL  EDEMA:  10/20/2023 No specific measurements.   MUSCLE LENGTH: 10/20/2023 No specific testing today  POSTURE:  10/20/2023 WB shift to Lt in standing.  SPC in Lt hand.   PALPATION: 10/20/2023 General tenderness to tough in Rt anterior and lateral thigh.   LOWER EXTREMITY ROM:  10/20/2023:  unable to assess as Pt was not willing to lie on table today.    ROM Right 10/28/2023    Hip flexion 85 AAROM with strap    Hip extension     Hip abduction     Hip adduction     Hip internal rotation     Hip external rotation     Knee flexion     Knee extension     Ankle dorsiflexion     Ankle plantarflexion     Ankle inversion     Ankle eversion      (Blank rows = not tested)  LOWER EXTREMITY MMT:  10/20/2023: Testing limited in hip due to patient desire to not lie on table today   MMT Right 10/20/2023 Left 10/20/2023 Right 12/01/2023 Left 12/01/2023 Right 12/22/2023 Left 12/22/2023  Hip flexion 3+/5 4/5 4+/5 4+/5 4+/5 4+/5  Hip extension        Hip abduction        Hip adduction        Hip internal rotation        Hip external rotation        Knee flexion 4/5 5/5 4+/5 5/5 5/5 5/5  Knee extension 4/5 5/5 4+/5 5/5 5/5 5/5  Ankle dorsiflexion 5/5 5/5      Ankle plantarflexion        Ankle inversion        Ankle eversion         (Blank rows = not tested)  LOWER EXTREMITY SPECIAL TESTS:  10/20/2023 No specific testing performed today.   FUNCTIONAL TESTS:  12/01/2023: TUG from 18 inch chair: independent 13.67 seconds 5 x sit to stand from 18 inch:  14.63 seconds s UE assist  10/20/2023 18 inch  chair transfer: unable unassisted.  Lt SLS: 30 seconds Rt SLS: 8 seconds  No TUG due to patient not wanting to sit in standard chair height.   GAIT: 10/20/2023 SPC use in Lt hand with reduced stance phase, reduced toe off progression, reduced gait speed.  TODAY'S TREATMENT                                                                          DATE: 12/22/2023 Therex Nustep Lvl 6 10.5 mins for ROM/general aerobic Seated quad set c SLR 2 x 15 bilateral   Neuro Re-ed (neural recruitment/activation, balance improvements) Tandem stance on foam 1 min x 2 bilateral with occasional HHA Lateral stepping on foam with lifting foot up focus 6 ft x 5 each way in // bars with occasional HHA Feet together alternating heel/toe lifts on foam pad 2 x 20 each way   TherActivity (to improve progressive mobility, transfers, stairs and other activity of day) TRX strap assisted squat to chair touch and back 2 x 10  Functional kettle bell lift 5 lbs to 8 inch step height  x 10 with feet even, offset with Lt leg anterior Discussed split squat option for positioning with Lt leg anterior to load Lt leg more to avoid Rt knee pain.  Reviewed body mechanics focus to avoid lumbar flexion.  Continued encouragement for sitting in normal chairs as able.   Flight of stairs going down with SBA with hand rail assist and cane.  Reciprocal gait pattern.   TODAY'S TREATMENT                                                                          DATE: 12/17/2023 Therex Nustep Lvl 5 10 mins for ROM/general aerobic Seated hip flexion AAROM stretch 5 sec hold 2X10 Seated p ball roll outs for lumbar flexion/hip flexion stretch 5 sec 2X10   Neuro Re-ed (neural recruitment/activation, balance improvements) Tandem walking 5 round trips in bars, intermittent UE  support Sidestepping on foam beam X 5 round trips in bars without UE support SLS 20 sec X 3 bilat, intermittent UE support    TherActivity (to improve progressive mobility, transfers, stairs and other activity of day) Leg press into max hip flexion tolerated, double leg 56 lbs 2X10 , single leg 25 lbs  2X10  bilateral  Lifting from waist to 10 inch from floor (8 inch block plus 2 inch block) using 5# kettlebell X5 reps. Then progressed to doing this from 8 inch block only 5# 2X10 TRX squats 2X10  TODAY'S TREATMENT                                                                          DATE: 12/15/2023 Therex Nustep Lvl 5 10 mins for ROM/general aerobic Seated hip flexion AAROM stretch 5 sec hold 2X10 Seated p ball roll outs for lumbar flexion/hip flexion stretch 5 sec 2X10   Neuro Re-ed (neural recruitment/activation, balance improvements) Tandem walking 5 round  trips in bars, intermittent UE support Sidestepping on foam beam X 5 round trips in bars without UE support SLS 20 sec X 3 bilat, intermittent UE support    TherActivity (to improve progressive mobility, transfers, stairs and other activity of day) Sit to stand to sit 18 inch chair 2 X 5 reps without UE support Leg press double leg 56 lbs 2X10 , single leg 25 lbs  2X10  bilateral  Lifting from waist to 10 inch from floor (8 inch block plus 2 inch block) using 5# kettlebell 2X5 reps.   PATIENT EDUCATION:  12/01/2023 Education details: HEP update Person educated: Patient Education method: Explanation, Demonstration, Verbal cues, and Handouts Education comprehension: verbalized understanding, returned demonstration, and verbal cues required  HOME EXERCISE PROGRAM: Access Code: [email or web address] URL: [email or web address] Date: 12/01/2023 Prepared by: Chyrel Masson  Exercises - Seated Quad Set  - 2 x daily - 7 x weekly - 1 sets - 10 reps - 5 hold - Seated March  - 2 x daily - 7 x weekly - 2-3 sets - 10 reps -  Seated Long Arc Quad  - 2 x daily - 7 x weekly - 2-3 sets - 10 reps - 2 hold - Seated Hip Abduction with Resistance  - 2 x daily - 7 x weekly - 3 sets - 10 reps - Tandem Stance in Corner  - 1 x daily - 7 x weekly - 1 sets - 3-5 reps - 30 hold - Supine Heel Slide  - 1-2 x daily - 7 x weekly - 1 sets - 10 reps - 5 hold - Supine Bridge  - 1-2 x daily - 7 x weekly - 2 sets - 10 reps - 2 hold - Seated Straight Leg Heel Taps  - 1-2 x daily - 7 x weekly - 3 sets - 10 reps - Sit to Stand  - 3 x daily - 7 x weekly - 1 sets - 10 reps  ASSESSMENT:  CLINICAL IMPRESSION: Recheck of LE strength showed mild gains.  Continued emphasis on promoting WB activity and functional strengthening to help improve functional movement confidence and contribute to bone health improvements due to loading.  Pt may continue to benefit from skilled PT services.   OBJECTIVE IMPAIRMENTS: Abnormal gait, decreased activity tolerance, decreased balance, decreased coordination, decreased endurance, decreased mobility, difficulty walking, decreased ROM, decreased strength, increased fascial restrictions, impaired perceived functional ability, increased muscle spasms, impaired flexibility, improper body mechanics, postural dysfunction, and pain.   ACTIVITY LIMITATIONS: carrying, lifting, bending, sitting, standing, squatting, sleeping, stairs, transfers, bed mobility, bathing, dressing, hygiene/grooming, and locomotion level  PARTICIPATION LIMITATIONS: meal prep, cleaning, laundry, interpersonal relationship, driving, shopping, community activity, and yard work  PERSONAL FACTORS: Past/current experiences, Time since onset of injury/illness/exacerbation, and osteoporosis, HTN, hyperlipidemia, Rt knee patellar fracture history.   are also affecting patient's functional outcome.   REHAB POTENTIAL: Good  CLINICAL DECISION MAKING: Stable/uncomplicated  EVALUATION COMPLEXITY: Low   GOALS: Goals reviewed with patient? Yes  SHORT  TERM GOALS: (target date for Short term goals are 3 weeks 11/10/2023)   1.  Patient will demonstrate independent use of home exercise program to maintain progress from in clinic treatments.  Goal status: Met  LONG TERM GOALS: (target dates for all long term goals are 12 weeks  01/12/2024 )   1. Patient will demonstrate/report pain at worst less than or equal to 2/10 to facilitate minimal limitation in daily activity secondary to pain symptoms.  Goal status: on going 12/22/2023   2.  Patient will demonstrate independent use of home exercise program to facilitate ability to maintain/progress functional gains from skilled physical therapy services.  Goal status:  on going 12/22/2023   3. Patient will demonstrate FOTO outcome > or = 63 % to indicate reduced disability due to condition.  Goal status:  on going 12/22/2023   4.  Patient will demonstrate Rt LE MMT 4/5 or greater  throughout to faciltiate usual transfers, stairs, squatting at [facility] for daily life.   Goal status:  Mostly met  12/22/2023(hip flexion and knee were met)   5.  Patient will demonstrate independent ambulation community distances > 500 ft for walking exercise/community integration.  Goal status:  on going 12/22/2023   6.  Patient will demonstrate ascending/descending stairs with reciprocal pattern s UE assist for household entry/exit.  Goal status:  on going 12/22/2023   7.  Patient will demonstrate ability to get in and out of bed for sleeping in bed (higher bed height with step stool required).  Goal Status:  Met 12/01/2023   PLAN:  PT FREQUENCY: 1-2x/week  PT DURATION: 12 weeks  PLANNED INTERVENTIONS: Can include 22025- PT Re-evaluation, 97110-Therapeutic exercises, 97530- Therapeutic activity, 97112- Neuromuscular re-education, 97535- Self Care, 97140- Manual therapy, [phone]- Gait training, [phone]- Orthotic Fit/training, [phone]- Aquatic Therapy, [phone]- Electrical stimulation (unattended),   Patient/Family education,  Balance training, Stair training, Taping, Dry Needling, Joint mobilization, Joint manipulation, Spinal manipulation, Spinal mobilization, Scar mobilization, Vestibular training, Visual/preceptual remediation/compensation, DME instructions, Cryotherapy, and Moist heat.  All performed as medically necessary.  All included unless contraindicated  PLAN FOR NEXT SESSION: Balance control, functional strength/movement improvements. Improve confidence.  FOTO reassessment.   Try Recumbent bike    Chyrel Masson, PT, DPT, OCS, ATC 12/22/23  10:22 AM       Referring diagnosis? 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 (if different than referring diagnosis) M25.551 Pain in Rt hip What was this (referring dx) caused by? [x]  Surgery [x]  Fall []  Ongoing issue []  Arthritis []  Other: ____________  Laterality: [x]  Rt []  Lt []  Both  Check all possible CPT codes:  *CHOOSE 10 OR LESS*    See Planned Interventions listed in the Plan section of the Evaluation.

## 2023-12-24 ENCOUNTER — Encounter: Payer: Self-pay | Admitting: Rehabilitative and Restorative Service Providers"

## 2023-12-24 ENCOUNTER — Ambulatory Visit: Payer: Medicare HMO | Admitting: Rehabilitative and Restorative Service Providers"

## 2023-12-24 DIAGNOSIS — M6281 Muscle weakness (generalized): Secondary | ICD-10-CM

## 2023-12-24 DIAGNOSIS — R262 Difficulty in walking, not elsewhere classified: Secondary | ICD-10-CM | POA: Diagnosis not present

## 2023-12-24 DIAGNOSIS — M25551 Pain in right hip: Secondary | ICD-10-CM | POA: Diagnosis not present

## 2023-12-24 NOTE — Therapy (Signed)
 OUTPATIENT PHYSICAL THERAPY TREATMENT    Patient Name: Cheryl Glass MRN: [id number] DOB:January 14, 1951, 73 y.o., female Today's Date: 12/24/2023   END OF SESSION:  PT End of Session - 12/24/23 0934     Visit Number 8    Number of Visits 24    Date for PT Re-Evaluation 01/12/24    Authorization Type [employer] $25 copay    Authorization Time Period -01/12/2024    Authorization - Visit Number 8    Authorization - Number of Visits 12    Progress Note Due on Visit 10    PT Start Time 0924    PT Stop Time 1006    PT Time Calculation (min) 42 min    Activity Tolerance Patient tolerated treatment well    Behavior During Therapy [facility] for tasks assessed/performed                 Past Medical History:  Diagnosis Date   Arthritis    Eating disorder    Hyperlipidemia    Hypertension    Osteoporosis    Right patella fracture    Wrist fracture 2013   right   Past Surgical History:  Procedure Laterality Date   AUGMENTATION MAMMAPLASTY     BREAST ENHANCEMENT SURGERY     CESAREAN SECTION     EXAM UNDER ANESTHESIA WITH MANIPULATION OF KNEE Right 06/06/2020   Procedure: EXAM UNDER ANESTHESIA WITH MANIPULATION OF KNEE;  Surgeon: Eldred Manges, MD;  Location: [facility];  Service: Orthopedics;  Laterality: Right;   HARDWARE REMOVAL Right 06/06/2020   Procedure: right knee wire removal;  Surgeon: Eldred Manges, MD;  Location: [facility];  Service: Orthopedics;  Laterality: Right;   HYSTEROSCOPY     OPEN REDUCTION INTERNAL FIXATION (ORIF) DISTAL RADIAL FRACTURE Right 07/16/2023   Procedure: OPEN REDUCTION INTERNAL FIXATION (ORIF) DISTAL RADIUS FRACTURE;  Surgeon: Tarry Kos, MD;  Location: [facility];  Service: Orthopedics;  Laterality: Right;   ORIF PATELLA Right 04/20/2020   Procedure: OPEN REDUCTION INTERNAL (ORIF) FIXATION RIGHT PATELLA;  Surgeon: Eldred Manges, MD;  Location: [facility];  Service: Orthopedics;  Laterality: Right;   TOTAL HIP ARTHROPLASTY  Right 07/16/2023   Procedure: TOTAL HIP ARTHROPLASTY ANTERIOR APPROACH;  Surgeon: Tarry Kos, MD;  Location: [facility];  Service: Orthopedics;  Laterality: Right;   TOTAL HIP REVISION Right 07/31/2023   Procedure: OPEN REDUCTION INTERNAL FIXATION AND REVISION OF RIGHT PERIPROSTHETIC HIP;  Surgeon: Tarry Kos, MD;  Location: [facility];  Service: Orthopedics;  Laterality: Right;   Patient Active Problem List   Diagnosis Date Noted   History of revision of total replacement of right hip joint 07/31/2023   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) 07/30/2023   Closed subcapital fracture of neck of right femur, initial encounter (HCC) 07/16/2023   Closed fracture of right distal radius and ulna, initial encounter 07/16/2023    PCP: Sigmund Hazel MD  REFERRING PROVIDER: Cristie Hem, PA-C  REFERRING DIAG: [phone] (ICD-10-CM) - 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  Reported a little stiff this morning.   PERTINENT HISTORY: PMH includes: arthritis, HLD, HTN, osteoporosis, Rt patella fx, Rt wrist fx. ORIF Rt patella 04/20/2020, wire removal 06/06/2020  PAIN:  NPRS scale: no pain.  Up to 4-5/10 in knee  Pain location:knee Pain description: tightness Aggravating factors: transfers, walking Relieving factors: ice  PRECAUTIONS: None  WEIGHT BEARING RESTRICTIONS: No  FALLS:  Has patient fallen in last 6 months? 1. Has fear of falling.   LIVING ENVIRONMENT: Lives in:  House/apartment Stairs: 2 stairs with no rail.  Has following equipment at home: [facility]  OCCUPATION: Retired  PLOF: Independent, walking for exercise, taking care of dog, total gym at home, routine exercise including squatting, planks.   PATIENT GOALS: Get back to walking on [address].   OBJECTIVE:   PATIENT SURVEYS:  12/01/2023: FOTO update:  60  10/20/2023 FOTO intake:  43  predicted:  63  COGNITION: 10/20/2023 Overall cognitive status: WFL    SENSATION: 10/20/2023 WFL  EDEMA:  10/20/2023 No specific measurements.   MUSCLE LENGTH: 10/20/2023 No specific testing today  POSTURE:  10/20/2023 WB shift to Lt in standing.  SPC in Lt hand.   PALPATION: 10/20/2023 General tenderness to tough in Rt anterior and lateral thigh.   LOWER EXTREMITY ROM:  10/20/2023:  unable to assess as Pt was not willing to lie on table today.    ROM Right 10/28/2023 Right 12/24/2023   Hip flexion 85 AAROM with strap 90 deg AROM   Hip extension     Hip abduction     Hip adduction     Hip internal rotation     Hip external rotation     Knee flexion     Knee extension     Ankle dorsiflexion     Ankle plantarflexion     Ankle inversion     Ankle eversion      (Blank rows = not tested)  LOWER EXTREMITY MMT:  10/20/2023: Testing limited in hip due to patient desire to not lie on table today   MMT Right 10/20/2023 Left 10/20/2023 Right 12/01/2023 Left 12/01/2023 Right 12/22/2023 Left 12/22/2023  Hip flexion 3+/5 4/5 4+/5 4+/5 4+/5 4+/5  Hip extension        Hip abduction        Hip adduction        Hip internal rotation        Hip external rotation        Knee flexion 4/5 5/5 4+/5 5/5 5/5 5/5  Knee extension 4/5 5/5 4+/5 5/5 5/5 5/5  Ankle dorsiflexion 5/5 5/5      Ankle plantarflexion        Ankle inversion        Ankle eversion         (Blank rows = not tested)  LOWER EXTREMITY SPECIAL TESTS:  10/20/2023 No specific testing performed today.   FUNCTIONAL TESTS:  12/01/2023: TUG  from 18 inch chair: independent 13.67 seconds 5 x sit to stand from 18 inch:  14.63 seconds s UE assist  10/20/2023 18 inch chair transfer: unable unassisted.  Lt SLS: 30 seconds Rt SLS: 8 seconds  No TUG due to patient not wanting to sit in standard chair height.   GAIT: 10/20/2023 SPC use in Lt hand with reduced stance phase, reduced toe off progression, reduced gait speed.  TODAY'S TREATMENT                                                                          DATE: 12/24/2023 Therex Nustep Lvl 6 10 mins for ROM/general aerobic Supine SKC hip flexion stretch 15 sec x 3 Rt leg  Supine bridge 2-3 sec hold 2 x 10 with blue band around knees hip abduction hold.  Recumbent bike partial circles, limited by hip flexion ability.  3 miins with instruction for home use.  Seat set at 8.    TherActivity (to improve progressive mobility, transfers, stairs and other activity of day) Flight of stairs up/down reciprocal gait with SBA occasional CGA with handrail in Rt hand and cane in left.  Leg press double leg in available hip flexion range 2 x 10 with slow lowering 50 lbs, single leg 25 lbs 2 x 10 bilaterally   TODAY'S TREATMENT                                                                          DATE: 12/22/2023 Therex Nustep Lvl 6 10.5 mins for ROM/general aerobic Seated quad set c SLR 2 x 15 bilateral   Neuro Re-ed (neural recruitment/activation, balance improvements) Tandem stance on foam 1 min x 2 bilateral with occasional HHA Lateral stepping on foam with lifting foot up focus 6 ft x 5 each way in // bars with occasional HHA Feet together alternating heel/toe lifts on foam pad 2 x 20 each way   TherActivity (to improve progressive mobility, transfers, stairs and other activity of day) TRX strap assisted squat to  chair touch and back 2 x 10  Functional kettle bell lift 5 lbs to 8 inch step height  x 10 with feet even, offset with Lt leg anterior Discussed split squat option for positioning with Lt leg anterior to load Lt leg more to avoid Rt knee pain.  Reviewed body mechanics focus to avoid lumbar flexion.  Continued encouragement for sitting in normal chairs as able.   Flight of stairs going down with SBA with hand rail assist and cane.  Reciprocal gait pattern.   TODAY'S TREATMENT                                                                          DATE: 12/17/2023 Therex Nustep Lvl 5 10 mins for ROM/general aerobic Seated hip flexion AAROM stretch 5 sec hold 2X10 Seated p ball roll outs for lumbar flexion/hip flexion stretch 5 sec 2X10   Neuro Re-ed (neural recruitment/activation, balance improvements) Tandem walking 5 round trips in bars, intermittent UE support Sidestepping on foam beam X 5 round trips in bars without UE support SLS 20 sec  X 3 bilat, intermittent UE support    TherActivity (to improve progressive mobility, transfers, stairs and other activity of day) Leg press into max hip flexion tolerated, double leg 56 lbs 2X10 , single leg 25 lbs  2X10  bilateral  Lifting from waist to 10 inch from floor (8 inch block plus 2 inch block) using 5# kettlebell X5 reps. Then progressed to doing this from 8 inch block only 5# 2X10 TRX squats 2X10  TODAY'S TREATMENT                                                                          DATE: 12/15/2023 Therex Nustep Lvl 5 10 mins for ROM/general aerobic Seated hip flexion AAROM stretch 5 sec hold 2X10 Seated p ball roll outs for lumbar flexion/hip flexion stretch 5 sec 2X10   Neuro Re-ed (neural recruitment/activation, balance improvements) Tandem walking 5 round trips in bars, intermittent UE support Sidestepping on foam beam X 5 round trips in bars without UE support SLS 20 sec X 3 bilat, intermittent UE support    TherActivity  (to improve progressive mobility, transfers, stairs and other activity of day) Sit to stand to sit 18 inch chair 2 X 5 reps without UE support Leg press double leg 56 lbs 2X10 , single leg 25 lbs  2X10  bilateral  Lifting from waist to 10 inch from floor (8 inch block plus 2 inch block) using 5# kettlebell 2X5 reps.   PATIENT EDUCATION:  12/24/2023 Education details: HEP update Person educated: Patient Education method: Explanation, Demonstration, Verbal cues, and Handouts Education comprehension: verbalized understanding, returned demonstration, and verbal cues required  HOME EXERCISE PROGRAM: Access Code: [email or web address] URL: [email or web address] Date: 12/24/2023 Prepared by: Chyrel Masson  Exercises - Seated Quad Set  - 2 x daily - 7 x weekly - 1 sets - 10 reps - 5 hold - Seated March  - 2 x daily - 7 x weekly - 2-3 sets - 10 reps - Seated Long Arc Quad  - 2 x daily - 7 x weekly - 2-3 sets - 10 reps - 2 hold - Hooklying Single Knee to Chest Stretch  - 2 x daily - 7 x weekly - 1 sets - 5 reps - 15 hold - Hooklying Isometric Clamshell  - 1-2 x daily - 7 x weekly - 2-3 sets - 10-15 reps - Supine Bridge with Resistance Band  - 1-2 x daily - 7 x weekly - 1-2 sets - 10-15 reps - 2 hold - Tandem Stance in Corner  - 1 x daily - 7 x weekly - 1 sets - 3-5 reps - 30 hold - Seated Straight Leg Heel Taps  - 1-2 x daily - 7 x weekly - 3 sets - 10 reps - Sit to Stand  - 3 x daily - 7 x weekly - 1 sets - 10 reps  ASSESSMENT:  CLINICAL IMPRESSION: Pt to continue to benefit from hip mobility, strengthening and functional training to continue to improve ability and confidence to improve activity in daily life.  Pt has demonstrated continued slow and steady progress.  As activity level has increased, Rt knee pain has shown at times.  Will need continued adjustments to adapt to symptoms.  OBJECTIVE IMPAIRMENTS: Abnormal gait, decreased activity tolerance, decreased balance, decreased  coordination, decreased endurance, decreased mobility, difficulty walking, decreased ROM, decreased strength, increased fascial restrictions, impaired perceived functional ability, increased muscle spasms, impaired flexibility, improper body mechanics, postural dysfunction, and pain.   ACTIVITY LIMITATIONS: carrying, lifting, bending, sitting, standing, squatting, sleeping, stairs, transfers, bed mobility, bathing, dressing, hygiene/grooming, and locomotion level  PARTICIPATION LIMITATIONS: meal prep, cleaning, laundry, interpersonal relationship, driving, shopping, community activity, and yard work  PERSONAL FACTORS: Past/current experiences, Time since onset of injury/illness/exacerbation, and osteoporosis, HTN, hyperlipidemia, Rt knee patellar fracture history.   are also affecting patient's functional outcome.   REHAB POTENTIAL: Good  CLINICAL DECISION MAKING: Stable/uncomplicated  EVALUATION COMPLEXITY: Low   GOALS: Goals reviewed with patient? Yes  SHORT TERM GOALS: (target date for Short term goals are 3 weeks 11/10/2023)   1.  Patient will demonstrate independent use of home exercise program to maintain progress from in clinic treatments.  Goal status: Met  LONG TERM GOALS: (target dates for all long term goals are 12 weeks  01/12/2024 )   1. Patient will demonstrate/report pain at worst less than or equal to 2/10 to facilitate minimal limitation in daily activity secondary to pain symptoms.  Goal status: on going 12/22/2023   2. Patient will demonstrate independent use of home exercise program to facilitate ability to maintain/progress functional gains from skilled physical therapy services.  Goal status:  on going 12/22/2023   3. Patient will demonstrate FOTO outcome > or = 63 % to indicate reduced disability due to condition.  Goal status:  on going 12/22/2023   4.  Patient will demonstrate Rt LE MMT 4/5 or greater  throughout to faciltiate usual transfers, stairs, squatting  at [facility] for daily life.   Goal status:  Mostly met  12/22/2023(hip flexion and knee were met)   5.  Patient will demonstrate independent ambulation community distances > 500 ft for walking exercise/community integration.  Goal status:  on going 12/22/2023   6.  Patient will demonstrate ascending/descending stairs with reciprocal pattern s UE assist for household entry/exit.  Goal status:  on going 12/22/2023   7.  Patient will demonstrate ability to get in and out of bed for sleeping in bed (higher bed height with step stool required).  Goal Status:  Met 12/01/2023   PLAN:  PT FREQUENCY: 1-2x/week  PT DURATION: 12 weeks  PLANNED INTERVENTIONS: Can include 59563- PT Re-evaluation, 97110-Therapeutic exercises, 97530- Therapeutic activity, 97112- Neuromuscular re-education, 97535- Self Care, 97140- Manual therapy, [phone]- Gait training, [phone]- Orthotic Fit/training, [phone]- Aquatic Therapy, [phone]- Electrical stimulation (unattended),   Patient/Family education, Balance training, Stair training, Taping, Dry Needling, Joint mobilization, Joint manipulation, Spinal manipulation, Spinal mobilization, Scar mobilization, Vestibular training, Visual/preceptual remediation/compensation, DME instructions, Cryotherapy, and Moist heat.  All performed as medically necessary.  All included unless contraindicated  PLAN FOR NEXT SESSION: Continue progressive strengthening and mobility gains.    Chyrel Masson, PT, DPT, OCS, ATC 12/24/23  10:11 AM       Referring diagnosis? 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 (if different than referring diagnosis) M25.551 Pain in Rt hip What was this (referring dx) caused by? [x]  Surgery [x]  Fall []  Ongoing issue []  Arthritis []  Other: ____________  Laterality: [x]  Rt []  Lt []  Both  Check all possible CPT codes:  *CHOOSE 10 OR LESS*    See Planned  Interventions listed in the Plan section of the Evaluation.

## 2023-12-29 ENCOUNTER — Encounter: Payer: Self-pay | Admitting: Rehabilitative and Restorative Service Providers"

## 2023-12-29 ENCOUNTER — Ambulatory Visit: Payer: Medicare HMO | Admitting: Rehabilitative and Restorative Service Providers"

## 2023-12-29 DIAGNOSIS — M6281 Muscle weakness (generalized): Secondary | ICD-10-CM | POA: Diagnosis not present

## 2023-12-29 DIAGNOSIS — R262 Difficulty in walking, not elsewhere classified: Secondary | ICD-10-CM | POA: Diagnosis not present

## 2023-12-29 DIAGNOSIS — M25551 Pain in right hip: Secondary | ICD-10-CM | POA: Diagnosis not present

## 2023-12-29 NOTE — Therapy (Signed)
 OUTPATIENT PHYSICAL THERAPY TREATMENT    Patient Name: Cheryl Glass MRN: [id number] DOB:[birth year], 73 y.o., female Today's Date: 12/29/2023   END OF SESSION:  PT End of Session - 12/29/23 0934     Visit Number 9    Number of Visits 24    Date for PT Re-Evaluation 01/12/24    Authorization Type [employer] $25 copay    Authorization Time Period -01/12/2024    Authorization - Number of Visits 12    Progress Note Due on Visit 10    PT Start Time 0916    PT Stop Time 1011    PT Time Calculation (min) 55 min    Activity Tolerance Patient tolerated treatment well    Behavior During Therapy [facility] for tasks assessed/performed                  Past Medical History:  Diagnosis Date   Arthritis    Eating disorder    Hyperlipidemia    Hypertension    Osteoporosis    Right patella fracture    Wrist fracture 2013   right   Past Surgical History:  Procedure Laterality Date   AUGMENTATION MAMMAPLASTY     BREAST ENHANCEMENT SURGERY     CESAREAN SECTION     EXAM UNDER ANESTHESIA WITH MANIPULATION OF KNEE Right 06/06/2020   Procedure: EXAM UNDER ANESTHESIA WITH MANIPULATION OF KNEE;  Surgeon: Eldred Manges, MD;  Location: [facility];  Service: Orthopedics;  Laterality: Right;   HARDWARE REMOVAL Right 06/06/2020   Procedure: right knee wire removal;  Surgeon: Eldred Manges, MD;  Location: [facility];  Service: Orthopedics;  Laterality: Right;   HYSTEROSCOPY     OPEN REDUCTION INTERNAL FIXATION (ORIF) DISTAL RADIAL FRACTURE Right 07/16/2023   Procedure: OPEN REDUCTION INTERNAL FIXATION (ORIF) DISTAL RADIUS FRACTURE;  Surgeon: Tarry Kos, MD;  Location: [facility];  Service: Orthopedics;  Laterality: Right;   ORIF PATELLA Right 04/20/2020   Procedure: OPEN REDUCTION INTERNAL (ORIF) FIXATION RIGHT PATELLA;  Surgeon: Eldred Manges, MD;  Location: [facility];  Service: Orthopedics;  Laterality: Right;   TOTAL HIP ARTHROPLASTY Right 07/16/2023   Procedure:  TOTAL HIP ARTHROPLASTY ANTERIOR APPROACH;  Surgeon: Tarry Kos, MD;  Location: [facility];  Service: Orthopedics;  Laterality: Right;   TOTAL HIP REVISION Right 07/31/2023   Procedure: OPEN REDUCTION INTERNAL FIXATION AND REVISION OF RIGHT PERIPROSTHETIC HIP;  Surgeon: Tarry Kos, MD;  Location: [facility];  Service: Orthopedics;  Laterality: Right;   Patient Active Problem List   Diagnosis Date Noted   History of revision of total replacement of right hip joint 07/31/2023   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) 07/30/2023   Closed subcapital fracture of neck of right femur, initial encounter (HCC) 07/16/2023   Closed fracture of right distal radius and ulna, initial encounter 07/16/2023    PCP: Sigmund Hazel MD  REFERRING PROVIDER: Cristie Hem, PA-C  REFERRING DIAG: [phone] (ICD-10-CM) - 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  Pt indicated not doing HEP yesterday because of "busy day."   PERTINENT HISTORY: PMH includes: arthritis, HLD, HTN, osteoporosis, Rt patella fx, Rt  wrist fx. ORIF Rt patella 04/20/2020, wire removal 06/06/2020  PAIN:  NPRS scale: no pain.  Up to 4-5/10 in knee  Pain location:knee Pain description: tightness Aggravating factors: transfers, walking Relieving factors: ice  PRECAUTIONS: None  WEIGHT BEARING RESTRICTIONS: No  FALLS:  Has patient fallen in last 6 months? 1. Has fear of falling.   LIVING ENVIRONMENT: Lives in: House/apartment Stairs: 2 stairs with no rail.  Has following equipment at home: [facility]  OCCUPATION: Retired  PLOF:  Independent, walking for exercise, taking care of dog, total gym at home, routine exercise including squatting, planks.   PATIENT GOALS: Get back to walking on [address].   OBJECTIVE:   PATIENT SURVEYS:  12/01/2023: FOTO update:  60  10/20/2023 FOTO intake:  43  predicted:  63  COGNITION: 10/20/2023 Overall cognitive status: WFL    SENSATION: 10/20/2023 WFL  EDEMA:  10/20/2023 No specific measurements.   MUSCLE LENGTH: 10/20/2023 No specific testing today  POSTURE:  10/20/2023 WB shift to Lt in standing.  SPC in Lt hand.   PALPATION: 10/20/2023 General tenderness to tough in Rt anterior and lateral thigh.   LOWER EXTREMITY ROM:  10/20/2023:  unable to assess as Pt was not willing to lie on table today.    ROM Right 10/28/2023 Right 12/24/2023   Hip flexion 85 AAROM with strap 90 deg AROM   Hip extension     Hip abduction     Hip adduction     Hip internal rotation     Hip external rotation     Knee flexion     Knee extension     Ankle dorsiflexion     Ankle plantarflexion     Ankle inversion     Ankle eversion      (Blank rows = not tested)  LOWER EXTREMITY MMT:  10/20/2023: Testing limited in hip due to patient desire to not lie on table today   MMT Right 10/20/2023 Left 10/20/2023 Right 12/01/2023 Left 12/01/2023 Right 12/22/2023 Left 12/22/2023  Hip flexion 3+/5 4/5 4+/5 4+/5 4+/5 4+/5  Hip extension        Hip abduction        Hip adduction        Hip internal rotation        Hip external rotation        Knee flexion 4/5 5/5 4+/5 5/5 5/5 5/5  Knee extension 4/5 5/5 4+/5 5/5 5/5 5/5  Ankle dorsiflexion 5/5 5/5      Ankle plantarflexion        Ankle inversion        Ankle eversion         (Blank rows = not tested)  LOWER EXTREMITY SPECIAL TESTS:  10/20/2023 No specific testing performed today.   FUNCTIONAL TESTS:  12/01/2023: TUG from 18 inch chair: independent 13.67 seconds 5 x sit to stand from 18 inch:  14.63 seconds s UE  assist  10/20/2023 18 inch chair transfer: unable unassisted.  Lt SLS: 30 seconds Rt SLS: 8 seconds  No TUG due to patient not wanting to sit in standard chair height.   GAIT: 10/20/2023 SPC use in Lt hand with reduced stance phase, reduced toe off progression, reduced gait speed.  TODAY'S TREATMENT                                                                          DATE: 12/29/2023 Therex Nustep Lvl 6 12 mins lvl 5 1 min for 13 mins total UE/LE Supine SKC hip flexion stretch 15 sec x 4 Rt leg  Supine bridge 2-3 sec hold 2 x 10 with blue band around knees hip abduction hold.   Theract Leg press double leg in available hip flexion range 2 x 10 with slow lowering 50 lbs, single leg 25 lbs 2 x 10 bilaterally TRX strap assisted squat to chair touch and back 2 x 10  Lifting from waist to 10 inch from floor (8 inch block plus 2 inch block) using 5# kettlebell x10 with vc, tc and visual cuing for proper wt shift  Neuro Re-ed  //bar Tandem stance on foam 2x30sec ea bilateral with occasional HHA //bar Tandem ambulation on foam fwd/bwd x4lengths //bar Lateral stepping on foam with lifting foot up focus x4 lengths with occasional HHA   TREATMENT                                                                          DATE: 12/24/2023 Therex Nustep Lvl 6 10 mins for ROM/general aerobic Supine SKC hip flexion stretch 15 sec x 3 Rt leg  Supine bridge 2-3 sec hold 2 x 10 with blue band around knees hip abduction hold.  Recumbent bike partial circles, limited by hip flexion ability.  3 miins with instruction for home use.  Seat set at 8.    TherActivity (to improve progressive mobility, transfers, stairs and other activity of day) Flight of stairs up/down reciprocal gait with SBA occasional CGA with handrail in Rt hand and  cane in left.  Leg press double leg in available hip flexion range 2 x 10 with slow lowering 50 lbs, single leg 25 lbs 2 x 10 bilaterally   TODAY'S TREATMENT                                                                          DATE: 12/22/2023 Therex Nustep Lvl 6 10.5 mins for ROM/general aerobic Seated quad set c SLR 2 x 15 bilateral   Neuro Re-ed (neural recruitment/activation, balance improvements) Tandem stance on foam 1 min x 2 bilateral with occasional HHA Lateral stepping on foam with lifting foot up focus 6 ft x 5 each way in // bars with occasional HHA Feet together alternating heel/toe lifts on foam pad 2 x 20 each way   TherActivity (to improve progressive mobility, transfers, stairs and other activity of day) TRX strap assisted squat to chair touch and back  2 x 10  Functional kettle bell lift 5 lbs to 8 inch step height  x 10 with feet even, offset with Lt leg anterior Discussed split squat option for positioning with Lt leg anterior to load Lt leg more to avoid Rt knee pain.  Reviewed body mechanics focus to avoid lumbar flexion.  Continued encouragement for sitting in normal chairs as able.   Flight of stairs going down with SBA with hand rail assist and cane.  Reciprocal gait pattern.   TODAY'S TREATMENT                                                                          DATE: 12/17/2023 Therex Nustep Lvl 5 10 mins for ROM/general aerobic Seated hip flexion AAROM stretch 5 sec hold 2X10 Seated p ball roll outs for lumbar flexion/hip flexion stretch 5 sec 2X10   Neuro Re-ed (neural recruitment/activation, balance improvements) Tandem walking 5 round trips in bars, intermittent UE support Sidestepping on foam beam X 5 round trips in bars without UE support SLS 20 sec X 3 bilat, intermittent UE support    TherActivity (to improve progressive mobility, transfers, stairs and other activity of day) Leg press into max hip flexion tolerated, double leg 56 lbs 2X10 ,  single leg 25 lbs  2X10  bilateral  Lifting from waist to 10 inch from floor (8 inch block plus 2 inch block) using 5# kettlebell X5 reps. Then progressed to doing this from 8 inch block only 5# 2X10 TRX squats 2X10  TODAY'S TREATMENT                                                                          DATE: 12/15/2023 Therex Nustep Lvl 5 10 mins for ROM/general aerobic Seated hip flexion AAROM stretch 5 sec hold 2X10 Seated p ball roll outs for lumbar flexion/hip flexion stretch 5 sec 2X10   Neuro Re-ed (neural recruitment/activation, balance improvements) Tandem walking 5 round trips in bars, intermittent UE support Sidestepping on foam beam X 5 round trips in bars without UE support SLS 20 sec X 3 bilat, intermittent UE support    TherActivity (to improve progressive mobility, transfers, stairs and other activity of day) Sit to stand to sit 18 inch chair 2 X 5 reps without UE support Leg press double leg 56 lbs 2X10 , single leg 25 lbs  2X10  bilateral  Lifting from waist to 10 inch from floor (8 inch block plus 2 inch block) using 5# kettlebell 2X5 reps.   PATIENT EDUCATION:  12/24/2023 Education details: HEP update Person educated: Patient Education method: Explanation, Demonstration, Verbal cues, and Handouts Education comprehension: verbalized understanding, returned demonstration, and verbal cues required  HOME EXERCISE PROGRAM: Access Code: [email or web address] URL: [email or web address] Date: 12/24/2023 Prepared by: Chyrel Masson  Exercises - Seated Quad Set  - 2 x daily - 7 x weekly - 1 sets - 10 reps - 5 hold - Seated March  - 2 x  daily - 7 x weekly - 2-3 sets - 10 reps - Seated Long Arc Quad  - 2 x daily - 7 x weekly - 2-3 sets - 10 reps - 2 hold - Hooklying Single Knee to Chest Stretch  - 2 x daily - 7 x weekly - 1 sets - 5 reps - 15 hold - Hooklying Isometric Clamshell  - 1-2 x daily - 7 x weekly - 2-3 sets - 10-15 reps - Supine Bridge with Resistance Band   - 1-2 x daily - 7 x weekly - 1-2 sets - 10-15 reps - 2 hold - Tandem Stance in Corner  - 1 x daily - 7 x weekly - 1 sets - 3-5 reps - 30 hold - Seated Straight Leg Heel Taps  - 1-2 x daily - 7 x weekly - 3 sets - 10 reps - Sit to Stand  - 3 x daily - 7 x weekly - 1 sets - 10 reps  ASSESSMENT:  CLINICAL IMPRESSION: Pt did well with activity however required cuing for proper weight shift and prevention of knee valgus with activities. Continues to demonstrate deficits in strength and mobility with knee pain hindering activities at times. Will benefit from continued skilled physical therapy to address deficits and will need adjustments made to sessions depending on patient presentation of symptoms.   OBJECTIVE IMPAIRMENTS: Abnormal gait, decreased activity tolerance, decreased balance, decreased coordination, decreased endurance, decreased mobility, difficulty walking, decreased ROM, decreased strength, increased fascial restrictions, impaired perceived functional ability, increased muscle spasms, impaired flexibility, improper body mechanics, postural dysfunction, and pain.   ACTIVITY LIMITATIONS: carrying, lifting, bending, sitting, standing, squatting, sleeping, stairs, transfers, bed mobility, bathing, dressing, hygiene/grooming, and locomotion level  PARTICIPATION LIMITATIONS: meal prep, cleaning, laundry, interpersonal relationship, driving, shopping, community activity, and yard work  PERSONAL FACTORS: Past/current experiences, Time since onset of injury/illness/exacerbation, and osteoporosis, HTN, hyperlipidemia, Rt knee patellar fracture history.   are also affecting patient's functional outcome.   REHAB POTENTIAL: Good  CLINICAL DECISION MAKING: Stable/uncomplicated  EVALUATION COMPLEXITY: Low   GOALS: Goals reviewed with patient? Yes  SHORT TERM GOALS: (target date for Short term goals are 3 weeks 11/10/2023)   1.  Patient will demonstrate independent use of home exercise program  to maintain progress from in clinic treatments.  Goal status: Met  LONG TERM GOALS: (target dates for all long term goals are 12 weeks  01/12/2024 )   1. Patient will demonstrate/report pain at worst less than or equal to 2/10 to facilitate minimal limitation in daily activity secondary to pain symptoms.  Goal status: on going 12/22/2023   2. Patient will demonstrate independent use of home exercise program to facilitate ability to maintain/progress functional gains from skilled physical therapy services.  Goal status:  on going 12/22/2023   3. Patient will demonstrate FOTO outcome > or = 63 % to indicate reduced disability due to condition.  Goal status:  on going 12/22/2023   4.  Patient will demonstrate Rt LE MMT 4/5 or greater  throughout to faciltiate usual transfers, stairs, squatting at [facility] for daily life.   Goal status:  Mostly met  12/22/2023(hip flexion and knee were met)   5.  Patient will demonstrate independent ambulation community distances > 500 ft for walking exercise/community integration.  Goal status:  on going 12/22/2023   6.  Patient will demonstrate ascending/descending stairs with reciprocal pattern s UE assist for household entry/exit.  Goal status:  on going 12/22/2023   7.  Patient will demonstrate ability  to get in and out of bed for sleeping in bed (higher bed height with step stool required).  Goal Status:  Met 12/01/2023   PLAN:  PT FREQUENCY: 1-2x/week  PT DURATION: 12 weeks  PLANNED INTERVENTIONS: Can include 40981- PT Re-evaluation, 97110-Therapeutic exercises, 97530- Therapeutic activity, 97112- Neuromuscular re-education, 97535- Self Care, 97140- Manual therapy, [phone]- Gait training, [phone]- Orthotic Fit/training, [phone]- Aquatic Therapy, [phone]- Electrical stimulation (unattended),   Patient/Family education, Balance training, Stair training, Taping, Dry Needling, Joint mobilization, Joint manipulation, Spinal manipulation, Spinal mobilization, Scar  mobilization, Vestibular training, Visual/preceptual remediation/compensation, DME instructions, Cryotherapy, and Moist heat.  All performed as medically necessary.  All included unless contraindicated  PLAN FOR NEXT SESSION: Continue strengthening of LE with weight shifting activities.    Maxen Rowland, Keeya Dyckman, Student-PT 12/29/2023, 10:22 AM        Referring diagnosis? 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 (if different than referring diagnosis) M25.551 Pain in Rt hip What was this (referring dx) caused by? [x]  Surgery [x]  Fall []  Ongoing issue []  Arthritis []  Other: ____________  Laterality: [x]  Rt []  Lt []  Both  Check all possible CPT codes:  *CHOOSE 10 OR LESS*    See Planned Interventions listed in the Plan section of the Evaluation.

## 2023-12-31 ENCOUNTER — Encounter: Payer: Self-pay | Admitting: Rehabilitative and Restorative Service Providers"

## 2023-12-31 ENCOUNTER — Ambulatory Visit: Payer: Medicare HMO | Admitting: Rehabilitative and Restorative Service Providers"

## 2023-12-31 DIAGNOSIS — M25551 Pain in right hip: Secondary | ICD-10-CM | POA: Diagnosis not present

## 2023-12-31 DIAGNOSIS — R262 Difficulty in walking, not elsewhere classified: Secondary | ICD-10-CM | POA: Diagnosis not present

## 2023-12-31 DIAGNOSIS — M6281 Muscle weakness (generalized): Secondary | ICD-10-CM

## 2023-12-31 NOTE — Therapy (Addendum)
 OUTPATIENT PHYSICAL THERAPY TREATMENT / DISCHARGE   Patient Name: Cheryl Glass MRN: [id number] DOB:1951-06-21, 73 y.o., female Today's Date: 01/01/2024  Progress Note Reporting Period 10/20/2023 to 12/31/2023  See note below for Objective Data and Assessment of Progress/Goals.      END OF SESSION:  PT End of Session - 12/31/23 0928     Visit Number 10    Number of Visits 24    Date for PT Re-Evaluation 01/12/24    Authorization Type [employer] $25 copay    Authorization Time Period -01/12/2024    Authorization - Number of Visits 12    Progress Note Due on Visit 10    PT Start Time 0925    PT Stop Time 1019    PT Time Calculation (min) 54 min    Activity Tolerance Patient tolerated treatment well    Behavior During Therapy WFL for tasks assessed/performed                  Past Medical History:  Diagnosis Date   Arthritis    Eating disorder    Hyperlipidemia    Hypertension    Osteoporosis    Right patella fracture    Wrist fracture 2013   right   Past Surgical History:  Procedure Laterality Date   AUGMENTATION MAMMAPLASTY     BREAST ENHANCEMENT SURGERY     CESAREAN SECTION     EXAM UNDER ANESTHESIA WITH MANIPULATION OF KNEE Right 06/06/2020   Procedure: EXAM UNDER ANESTHESIA WITH MANIPULATION OF KNEE;  Surgeon: Barbarann Oneil BROCKS, MD;  Location: [facility];  Service: Orthopedics;  Laterality: Right;   HARDWARE REMOVAL Right 06/06/2020   Procedure: right knee wire removal;  Surgeon: Barbarann Oneil BROCKS, MD;  Location: [facility];  Service: Orthopedics;  Laterality: Right;   HYSTEROSCOPY     OPEN REDUCTION INTERNAL FIXATION (ORIF) DISTAL RADIAL FRACTURE Right 07/16/2023   Procedure: OPEN REDUCTION INTERNAL FIXATION (ORIF) DISTAL RADIUS FRACTURE;  Surgeon: Jerri Kay HERO, MD;  Location: [facility];  Service: Orthopedics;  Laterality: Right;   ORIF PATELLA Right 04/20/2020   Procedure: OPEN REDUCTION INTERNAL (ORIF) FIXATION RIGHT PATELLA;   Surgeon: Barbarann Oneil BROCKS, MD;  Location: [facility];  Service: Orthopedics;  Laterality: Right;   TOTAL HIP ARTHROPLASTY Right 07/16/2023   Procedure: TOTAL HIP ARTHROPLASTY ANTERIOR APPROACH;  Surgeon: Jerri Kay HERO, MD;  Location: [facility];  Service: Orthopedics;  Laterality: Right;   TOTAL HIP REVISION Right 07/31/2023   Procedure: OPEN REDUCTION INTERNAL FIXATION AND REVISION OF RIGHT PERIPROSTHETIC HIP;  Surgeon: Jerri Kay HERO, MD;  Location: [facility];  Service: Orthopedics;  Laterality: Right;   Patient Active Problem List   Diagnosis Date Noted   History of revision of total replacement of right hip joint 07/31/2023   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) 07/30/2023   Closed subcapital fracture of neck of right femur, initial encounter (HCC) 07/16/2023   Closed fracture of right distal radius and ulna, initial encounter 07/16/2023    PCP: Cleotilde Planas MD  REFERRING PROVIDER: Jule Ronal CROME, PA-C  REFERRING DIAG: [id number] (ICD-10-CM) - 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  PERTINENT HISTORY: PMH includes: arthritis, HLD, HTN, osteoporosis, Rt patella fx, Rt wrist fx. ORIF Rt patella 04/20/2020, wire removal 06/06/2020  PAIN:  NPRS scale: no pain.  Up to 4-5/10 in knee  Pain location:knee Pain description: tightness Aggravating factors: transfers, walking Relieving factors: ice  PRECAUTIONS: None  WEIGHT BEARING RESTRICTIONS: No  FALLS:  Has patient fallen in last 6 months? 1. Has fear of falling.   LIVING ENVIRONMENT: Lives in:  House/apartment Stairs: 2 stairs with no rail.  Has following equipment at home: [facility]  OCCUPATION: Retired  PLOF: Independent, walking for exercise, taking care of dog, total gym at home, routine exercise including squatting, planks.   PATIENT GOALS: Get back to walking on [address].   OBJECTIVE:   PATIENT SURVEYS:  12/31/2023: FOTO update: 64  12/01/2023: FOTO update:  60  10/20/2023 FOTO intake:  43  predicted:  63  COGNITION: 10/20/2023 Overall cognitive status: WFL    SENSATION: 10/20/2023 WFL  EDEMA:  10/20/2023 No specific measurements.   MUSCLE LENGTH: 10/20/2023 No specific testing today  POSTURE:  10/20/2023 WB shift to Lt in standing.  SPC in Lt hand.   PALPATION: 10/20/2023 General tenderness to tough in Rt anterior and lateral thigh.   LOWER EXTREMITY ROM:  10/20/2023:  unable to assess as Pt was not willing to lie on table today.    ROM Right 10/28/2023 Right 12/24/2023 Right  12/31/2023  Hip flexion 85 AAROM with strap 90 deg AROM 96 deg AAROM supine  Hip extension     Hip abduction     Hip adduction     Hip internal rotation     Hip external rotation     Knee flexion     Knee extension     Ankle dorsiflexion     Ankle plantarflexion     Ankle inversion     Ankle eversion      (Blank rows = not tested)  LOWER EXTREMITY MMT:  10/20/2023: Testing limited in hip due to patient desire to not lie on table today   MMT Right 10/20/2023 Left 10/20/2023 Right 12/01/2023 Left 12/01/2023 Right 12/22/2023 Left 12/22/2023  Hip flexion 3+/5 4/5 4+/5 4+/5 4+/5 4+/5  Hip extension        Hip abduction        Hip adduction        Hip internal rotation        Hip external rotation        Knee flexion 4/5 5/5 4+/5 5/5 5/5 5/5  Knee extension 4/5 5/5 4+/5 5/5 5/5 5/5  Ankle dorsiflexion 5/5 5/5      Ankle plantarflexion        Ankle inversion        Ankle eversion         (Blank rows = not tested)  LOWER EXTREMITY SPECIAL TESTS:  10/20/2023 No  specific testing performed today.   FUNCTIONAL TESTS:  12/31/2023 5x STS: 13 sec; no UE use TUG: 11sec no SPC and no UE when rising or sitting  12/01/2023: TUG from 18 inch chair: independent 13.67 seconds 5 x sit to stand from 18 inch:  14.63 seconds s UE assist  10/20/2023 18 inch chair transfer: unable unassisted.  Lt SLS: 30 seconds Rt SLS: 8 seconds  No TUG due to patient not wanting to sit in standard chair height.   GAIT: 10/20/2023 SPC use in Lt hand with reduced stance phase, reduced toe off progression, reduced gait speed.  TODAY'S TREATMENT                                                                          DATE: 12/31/2023 Therex Nustep Lvl 6 10 mins UE/LE Supine SKC hip flexion stretch 15 sec x 4 Rt leg  Supine bridge 2-3 sec hold 2 x 10 with blue band around knees hip abduction hold.  Seated HSC with blue tband  Theract Leg press double leg in available hip flexion range 2x15 with slow lowering 56 lbs, single leg 31 lbs 2 x 10 bilaterally Seated HSC with blue tband 2x10 RLE only; tc to reduce knee valgus and hip IR TRX strap assisted squat to chair touch and back x10  Mini squat without UE assist x10 tc for prevention of Rt knee valgus STS x5 without UE   Physical performance testing: See above for results.  Included TUG testing, 5 x sit to stand  TREATMENT                                                                          DATE: 12/29/2023 Therex Nustep Lvl 6 12 mins lvl 5 1 min for 13 mins total UE/LE Supine SKC hip flexion stretch 15 sec x 4 Rt leg  Supine bridge 2-3 sec hold 2 x 10 with blue band around knees hip abduction hold.   Theract Leg press double leg in available hip flexion range 2 x 10 with slow lowering 50 lbs, single leg 25 lbs 2 x 10 bilaterally TRX strap assisted  squat to chair touch and back 2 x 10  Lifting from waist to 10 inch from floor (8 inch block plus 2 inch block) using 5# kettlebell x10 with vc, tc and visual cuing for proper wt shift  Neuro Re-ed  //bar Tandem stance on foam 2x30sec ea bilateral with occasional HHA //bar Tandem ambulation on foam fwd/bwd x4lengths //bar Lateral stepping on foam with lifting foot up focus x4 lengths with occasional HHA   TREATMENT                                                                          DATE: 12/24/2023 Therex Nustep Lvl 6 10 mins for ROM/general aerobic Supine SKC hip flexion stretch 15 sec x 3 Rt leg  Supine bridge 2-3 sec hold 2 x 10 with blue band around knees hip abduction hold.  Recumbent bike partial circles, limited by hip flexion ability.  3 miins with instruction for home use.  Seat set at 8.    TherActivity (to improve progressive mobility, transfers, stairs and other activity of day) Flight of stairs up/down reciprocal gait with SBA occasional CGA with handrail in Rt  hand and cane in left.  Leg press double leg in available hip flexion range 2 x 10 with slow lowering 50 lbs, single leg 25 lbs 2 x 10 bilaterally   TODAY'S TREATMENT                                                                          DATE: 12/22/2023 Therex Nustep Lvl 6 10.5 mins for ROM/general aerobic Seated quad set c SLR 2 x 15 bilateral   Neuro Re-ed (neural recruitment/activation, balance improvements) Tandem stance on foam 1 min x 2 bilateral with occasional HHA Lateral stepping on foam with lifting foot up focus 6 ft x 5 each way in // bars with occasional HHA Feet together alternating heel/toe lifts on foam pad 2 x 20 each way   TherActivity (to improve progressive mobility, transfers, stairs and other activity of day) TRX strap assisted squat to chair touch and back 2 x 10  Functional kettle bell lift 5 lbs to 8 inch step height  x 10 with feet even, offset with Lt leg anterior Discussed split  squat option for positioning with Lt leg anterior to load Lt leg more to avoid Rt knee pain.  Reviewed body mechanics focus to avoid lumbar flexion.  Continued encouragement for sitting in normal chairs as able.   Flight of stairs going down with SBA with hand rail assist and cane.  Reciprocal gait pattern.   TODAY'S TREATMENT                                                                          DATE: 12/17/2023 Therex Nustep Lvl 5 10 mins for ROM/general aerobic Seated hip flexion AAROM stretch 5 sec hold 2X10 Seated p ball roll outs for lumbar flexion/hip flexion stretch 5 sec 2X10   Neuro Re-ed (neural recruitment/activation, balance improvements) Tandem walking 5 round trips in bars, intermittent UE support Sidestepping on foam beam X 5 round trips in bars without UE support SLS 20 sec X 3 bilat, intermittent UE support    TherActivity (to improve progressive mobility, transfers, stairs and other activity of day) Leg press into max hip flexion tolerated, double leg 56 lbs 2X10 , single leg 25 lbs  2X10  bilateral  Lifting from waist to 10 inch from floor (8 inch block plus 2 inch block) using 5# kettlebell X5 reps. Then progressed to doing this from 8 inch block only 5# 2X10 TRX squats 2X10  TODAY'S TREATMENT                                                                          DATE: 12/15/2023 Therex Nustep Lvl 5 10 mins for ROM/general aerobic Seated  hip flexion AAROM stretch 5 sec hold 2X10 Seated p ball roll outs for lumbar flexion/hip flexion stretch 5 sec 2X10   Neuro Re-ed (neural recruitment/activation, balance improvements) Tandem walking 5 round trips in bars, intermittent UE support Sidestepping on foam beam X 5 round trips in bars without UE support SLS 20 sec X 3 bilat, intermittent UE support    TherActivity (to improve progressive mobility, transfers, stairs and other activity of day) Sit to stand to sit 18 inch chair 2 X 5 reps without UE support Leg press  double leg 56 lbs 2X10 , single leg 25 lbs  2X10  bilateral  Lifting from waist to 10 inch from floor (8 inch block plus 2 inch block) using 5# kettlebell 2X5 reps.   PATIENT EDUCATION:  12/24/2023 Education details: HEP update Person educated: Patient Education method: Explanation, Demonstration, Verbal cues, and Handouts Education comprehension: verbalized understanding, returned demonstration, and verbal cues required  HOME EXERCISE PROGRAM: Access Code: [email or web address] URL: [email or web address] Date: 12/24/2023 Prepared by: Ozell Silvan  Exercises - Seated Quad Set  - 2 x daily - 7 x weekly - 1 sets - 10 reps - 5 hold - Seated March  - 2 x daily - 7 x weekly - 2-3 sets - 10 reps - Seated Long Arc Quad  - 2 x daily - 7 x weekly - 2-3 sets - 10 reps - 2 hold - Hooklying Single Knee to Chest Stretch  - 2 x daily - 7 x weekly - 1 sets - 5 reps - 15 hold - Hooklying Isometric Clamshell  - 1-2 x daily - 7 x weekly - 2-3 sets - 10-15 reps - Supine Bridge with Resistance Band  - 1-2 x daily - 7 x weekly - 1-2 sets - 10-15 reps - 2 hold - Tandem Stance in Corner  - 1 x daily - 7 x weekly - 1 sets - 3-5 reps - 30 hold - Seated Straight Leg Heel Taps  - 1-2 x daily - 7 x weekly - 3 sets - 10 reps - Sit to Stand  - 3 x daily - 7 x weekly - 1 sets - 10 reps  ASSESSMENT:  CLINICAL IMPRESSION: Patient has attended 10 visits overall during course of treatment.  Patient demonstrated improvement with physical performance testing and was able to ambulate without SPC for TUG while remaining safe. Patient continues to demonstrate deficits in strength that are highlighted by valgus knee collapse with activity. Patient will benefit from continued skilled physical therapy to address deficits.  OBJECTIVE IMPAIRMENTS: Abnormal gait, decreased activity tolerance, decreased balance, decreased coordination, decreased endurance, decreased mobility, difficulty walking, decreased ROM, decreased  strength, increased fascial restrictions, impaired perceived functional ability, increased muscle spasms, impaired flexibility, improper body mechanics, postural dysfunction, and pain.   ACTIVITY LIMITATIONS: carrying, lifting, bending, sitting, standing, squatting, sleeping, stairs, transfers, bed mobility, bathing, dressing, hygiene/grooming, and locomotion level  PARTICIPATION LIMITATIONS: meal prep, cleaning, laundry, interpersonal relationship, driving, shopping, community activity, and yard work  PERSONAL FACTORS: Past/current experiences, Time since onset of injury/illness/exacerbation, and osteoporosis, HTN, hyperlipidemia, Rt knee patellar fracture history.  are also affecting patient's functional outcome.   REHAB POTENTIAL: Good  CLINICAL DECISION MAKING: Stable/uncomplicated  EVALUATION COMPLEXITY: Low   GOALS: Goals reviewed with patient? Yes  SHORT TERM GOALS: (target date for Short term goals are 3 weeks 11/10/2023)   1.  Patient will demonstrate independent use of home exercise program to maintain progress from in clinic treatments.  Goal status: Met  LONG  TERM GOALS: (target dates for all long term goals are 12 weeks  01/12/2024 )   1. Patient will demonstrate/report pain at worst less than or equal to 2/10 to facilitate minimal limitation in daily activity secondary to pain symptoms.  Goal status: on going 12/22/2023   2. Patient will demonstrate independent use of home exercise program to facilitate ability to maintain/progress functional gains from skilled physical therapy services.  Goal status:  on going 12/22/2023   3. Patient will demonstrate FOTO outcome > or = 63 % to indicate reduced disability due to condition.  Goal status:  on going 12/22/2023   4.  Patient will demonstrate Rt LE MMT 4/5 or greater  throughout to faciltiate usual transfers, stairs, squatting at [facility] for daily life.   Goal status:  Mostly met  12/22/2023(hip flexion and knee were met)    5.  Patient will demonstrate independent ambulation community distances > 500 ft for walking exercise/community integration.  Goal status:  on going 12/22/2023   6.  Patient will demonstrate ascending/descending stairs with reciprocal pattern s UE assist for household entry/exit.  Goal status:  on going 12/22/2023   7.  Patient will demonstrate ability to get in and out of bed for sleeping in bed (higher bed height with step stool required).  Goal Status:  Met 12/01/2023   PLAN:  PT FREQUENCY: 1-2x/week  PT DURATION: 12 weeks  PLANNED INTERVENTIONS: Can include 02853- PT Re-evaluation, 97110-Therapeutic exercises, 97530- Therapeutic activity, 97112- Neuromuscular re-education, 97535- Self Care, 97140- Manual therapy, [phone]- Gait training, [phone]- Orthotic Fit/training, [phone]- Aquatic Therapy, [phone]- Electrical stimulation (unattended),   Patient/Family education, Balance training, Stair training, Taping, Dry Needling, Joint mobilization, Joint manipulation, Spinal manipulation, Spinal mobilization, Scar mobilization, Vestibular training, Visual/preceptual remediation/compensation, DME instructions, Cryotherapy, and Moist heat.  All performed as medically necessary.  All included unless contraindicated  PLAN FOR NEXT SESSION: continue LE strengthening with attention to reducing knee valgus/hip IR compensatory measures.   Ryo Klang, Lenora Gomes, Student-PT 12/31/2023, 5:20 PM   This entire session of physical therapy was performed under the direct supervision of PT signing evaluation /treatment. PT reviewed note and agrees.  Ozell Silvan, PT, DPT, OCS, ATC 01/01/24  7:46 AM   PHYSICAL THERAPY DISCHARGE SUMMARY  Visits from Start of Care: 10  Current functional level related to goals / functional outcomes: See note   Remaining deficits: See note   Education / Equipment: HEP  Patient goals were partially met. Patient is being discharged due to not returning since the last  visit.     Referring diagnosis? 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 (if different than referring diagnosis) M25.551 Pain in Rt hip What was this (referring dx) caused by? [x]  Surgery [x]  Fall []  Ongoing issue []  Arthritis []  Other: ____________  Laterality: [x]  Rt []  Lt []  Both  Check all possible CPT codes:  *CHOOSE 10 OR LESS*    See Planned Interventions listed in the Plan section of the Evaluation.

## 2024-01-05 ENCOUNTER — Encounter: Admitting: Rehabilitative and Restorative Service Providers"

## 2024-01-08 ENCOUNTER — Encounter: Admitting: Rehabilitative and Restorative Service Providers"

## 2024-01-12 DIAGNOSIS — E7800 Pure hypercholesterolemia, unspecified: Secondary | ICD-10-CM | POA: Diagnosis not present

## 2024-01-12 DIAGNOSIS — M810 Age-related osteoporosis without current pathological fracture: Secondary | ICD-10-CM | POA: Diagnosis not present

## 2024-01-12 DIAGNOSIS — Z6821 Body mass index (BMI) 21.0-21.9, adult: Secondary | ICD-10-CM | POA: Diagnosis not present

## 2024-01-12 DIAGNOSIS — I10 Essential (primary) hypertension: Secondary | ICD-10-CM | POA: Diagnosis not present

## 2024-01-12 DIAGNOSIS — R7303 Prediabetes: Secondary | ICD-10-CM | POA: Diagnosis not present

## 2024-01-19 ENCOUNTER — Encounter: Admitting: Rehabilitative and Restorative Service Providers"

## 2024-01-21 ENCOUNTER — Encounter: Admitting: Rehabilitative and Restorative Service Providers"

## 2024-02-25 ENCOUNTER — Ambulatory Visit
Admission: RE | Admit: 2024-02-25 | Discharge: 2024-02-25 | Disposition: A | Discharge status: Home / Self Care | Payer: Medicare HMO | Source: Ambulatory Visit | Attending: Family Medicine | Admitting: Family Medicine

## 2024-02-25 ENCOUNTER — Encounter: Payer: Self-pay | Admitting: Radiology

## 2024-02-25 DIAGNOSIS — M8588 Other specified disorders of bone density and structure, other site: Secondary | ICD-10-CM | POA: Diagnosis not present

## 2024-02-25 DIAGNOSIS — M810 Age-related osteoporosis without current pathological fracture: Secondary | ICD-10-CM

## 2024-02-25 DIAGNOSIS — N958 Other specified menopausal and perimenopausal disorders: Secondary | ICD-10-CM | POA: Diagnosis not present

## 2024-03-05 DIAGNOSIS — E7800 Pure hypercholesterolemia, unspecified: Secondary | ICD-10-CM | POA: Diagnosis not present

## 2024-03-05 DIAGNOSIS — I10 Essential (primary) hypertension: Secondary | ICD-10-CM | POA: Diagnosis not present

## 2024-03-05 DIAGNOSIS — M810 Age-related osteoporosis without current pathological fracture: Secondary | ICD-10-CM | POA: Diagnosis not present

## 2024-03-17 DIAGNOSIS — L718 Other rosacea: Secondary | ICD-10-CM | POA: Diagnosis not present

## 2024-03-17 DIAGNOSIS — B078 Other viral warts: Secondary | ICD-10-CM | POA: Diagnosis not present

## 2024-03-17 DIAGNOSIS — X32XXXA Exposure to sunlight, initial encounter: Secondary | ICD-10-CM | POA: Diagnosis not present

## 2024-03-17 DIAGNOSIS — L570 Actinic keratosis: Secondary | ICD-10-CM | POA: Diagnosis not present

## 2024-03-23 DIAGNOSIS — Z79899 Other long term (current) drug therapy: Secondary | ICD-10-CM | POA: Diagnosis not present

## 2024-03-28 ENCOUNTER — Telehealth: Payer: Self-pay | Admitting: Pharmacy Technician

## 2024-03-28 NOTE — Telephone Encounter (Signed)
 Auth Submission: NO AUTH NEEDED Site of care: [email or web address] Payer: [employer] MEDICARE Medication & CPT/J Code(s) submitted: Reclast  (Zolendronic acid) [id number] Diagnosis Code:  Route of submission (phone, fax, portal):  Phone # Fax # Auth type: Buy/Bill HB Units/visits requested: X1 DOSE Reference number:  Approval from: 03/28/24 to 10/05/24

## 2024-03-30 DIAGNOSIS — M810 Age-related osteoporosis without current pathological fracture: Secondary | ICD-10-CM | POA: Diagnosis not present

## 2024-03-30 DIAGNOSIS — Z713 Dietary counseling and surveillance: Secondary | ICD-10-CM | POA: Diagnosis not present

## 2024-03-31 ENCOUNTER — Other Ambulatory Visit (HOSPITAL_COMMUNITY): Payer: Self-pay | Admitting: *Deleted

## 2024-04-04 ENCOUNTER — Ambulatory Visit (HOSPITAL_COMMUNITY)
Admission: RE | Admit: 2024-04-04 | Discharge: 2024-04-04 | Disposition: A | Discharge status: Home / Self Care | Source: Ambulatory Visit | Attending: Family Medicine | Admitting: Family Medicine

## 2024-04-04 DIAGNOSIS — M810 Age-related osteoporosis without current pathological fracture: Secondary | ICD-10-CM | POA: Insufficient documentation

## 2024-04-04 DIAGNOSIS — E7800 Pure hypercholesterolemia, unspecified: Secondary | ICD-10-CM | POA: Diagnosis not present

## 2024-04-04 DIAGNOSIS — I10 Essential (primary) hypertension: Secondary | ICD-10-CM | POA: Diagnosis not present

## 2024-04-04 MED ORDER — ZOLEDRONIC ACID 5 MG/100ML IV SOLN
INTRAVENOUS | Status: AC
  Filled 2024-04-04: qty 100

## 2024-04-04 MED ORDER — ZOLEDRONIC ACID 5 MG/100ML IV SOLN
5.0000 mg | Freq: Once | INTRAVENOUS | Status: AC
  Administered 2024-04-04: 5 mg via INTRAVENOUS

## 2024-04-11 ENCOUNTER — Other Ambulatory Visit: Payer: Self-pay | Admitting: Family Medicine

## 2024-04-11 DIAGNOSIS — Z1231 Encounter for screening mammogram for malignant neoplasm of breast: Secondary | ICD-10-CM

## 2024-05-05 DIAGNOSIS — E7800 Pure hypercholesterolemia, unspecified: Secondary | ICD-10-CM | POA: Diagnosis not present

## 2024-05-05 DIAGNOSIS — M810 Age-related osteoporosis without current pathological fracture: Secondary | ICD-10-CM | POA: Diagnosis not present

## 2024-05-05 DIAGNOSIS — I10 Essential (primary) hypertension: Secondary | ICD-10-CM | POA: Diagnosis not present

## 2024-06-05 DIAGNOSIS — I10 Essential (primary) hypertension: Secondary | ICD-10-CM | POA: Diagnosis not present

## 2024-06-05 DIAGNOSIS — E7800 Pure hypercholesterolemia, unspecified: Secondary | ICD-10-CM | POA: Diagnosis not present

## 2024-06-05 DIAGNOSIS — M810 Age-related osteoporosis without current pathological fracture: Secondary | ICD-10-CM | POA: Diagnosis not present

## 2024-06-07 ENCOUNTER — Ambulatory Visit
Admission: RE | Admit: 2024-06-07 | Discharge: 2024-06-07 | Disposition: A | Discharge status: Home / Self Care | Source: Ambulatory Visit | Attending: Family Medicine | Admitting: Family Medicine

## 2024-06-07 DIAGNOSIS — Z1231 Encounter for screening mammogram for malignant neoplasm of breast: Secondary | ICD-10-CM

## 2024-06-14 ENCOUNTER — Other Ambulatory Visit (INDEPENDENT_AMBULATORY_CARE_PROVIDER_SITE_OTHER): Payer: Self-pay

## 2024-06-14 ENCOUNTER — Ambulatory Visit: Admitting: Orthopaedic Surgery

## 2024-06-14 ENCOUNTER — Encounter: Payer: Self-pay | Admitting: Orthopaedic Surgery

## 2024-06-14 DIAGNOSIS — S52601A Unspecified fracture of lower end of right ulna, initial encounter for closed fracture: Secondary | ICD-10-CM

## 2024-06-14 DIAGNOSIS — S52501A Unspecified fracture of the lower end of right radius, initial encounter for closed fracture: Secondary | ICD-10-CM | POA: Diagnosis not present

## 2024-06-14 DIAGNOSIS — M9701XA 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, initial encounter: Secondary | ICD-10-CM | POA: Diagnosis not present

## 2024-06-14 DIAGNOSIS — G8929 Other chronic pain: Secondary | ICD-10-CM | POA: Diagnosis not present

## 2024-06-14 DIAGNOSIS — M25561 Pain in right knee: Secondary | ICD-10-CM

## 2024-06-14 NOTE — Progress Notes (Signed)
 Office Visit Note   Patient: Cheryl Glass           Date of Birth: [birth year]           MRN: [id number] Visit Date: 06/14/2024              Requested by: Cleotilde Planas, MD [address] [place],  [place] [address] PCP: Cleotilde Planas, MD   Assessment & Plan: Visit Diagnoses:  1. 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 Closed fracture of right distal radius and ulna, initial encounter   3. 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  Her wrist shows significant improvement with minimal pain and good range of motion, allowing her to perform tasks like opening cans. She experiences occasional aching related to weather changes.  She has a leg length discrepancy managed with heel inserts and requests additional inserts for her shoes.  A past knee injury involved a fractured kneecap repaired with wiring, resulting in constant knee pain. She does not take medication for this pain.  She takes magnesium , calcium , and vitamin D  supplements to support bone density.  Right wrist exam shows fully healed surgical scar.  Excellent range of motion without pain. Right hip exam shows fully healed surgical scars.  No signs of infection.  Fluid painless range of motion.  Leg length discrepancy is present. Right knee exam is unremarkable.  Results RADIOLOGY X-ray: Fractures healed  Assessment and Plan Chronic right knee pain after prior patellar fracture and repair Chronic right knee pain secondary to previous patellar fracture repair with wiring. Pain is constant but manageable without prescription medication. - Recommend over-the-counter Tylenol  for pain management, maximum 4000 mg per day.  Status post periprosthetic right hip fracture, healed Healed periprosthetic right hip fracture with  leg length discrepancy managed with heel inserts. No physical restrictions from an orthopedic standpoint. - Provide additional heel inserts. - Prescribe [employer] for permanent heel insert solution.  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 Good flexibility and range of motion. Pain minimal and non-interfering with daily activities.  Follow-Up Instructions: No follow-ups on file.   Orders:  Orders Placed This Encounter  Procedures   XR HIP UNILAT W OR W/O PELVIS 2-3 VIEWS RIGHT   XR Wrist Complete Right   XR KNEE 3 VIEW RIGHT   No orders of the defined types were placed in this encounter.     Procedures: No procedures performed   Clinical Data: No additional findings.   Subjective: Chief Complaint  Patient presents with   Right Hip - Follow-up    ORIF periprosthetic femur 07/31/2023   Right Wrist - Follow-up    ORIF distal radius 07/16/2023   Right Knee - Pain    HPI  Review of Systems   Objective: Vital Signs: LMP 07/10/2013   Physical Exam  Ortho Exam  Specialty Comments:  No specialty comments available.  Imaging: XR Wrist Complete Right Result Date: 06/14/2024 X-rays of the wrist show prior ORIF of the distal radius fracture.  The fracture is completely healed and there is no hardware complications.  XR KNEE 3 VIEW RIGHT Result Date: 06/14/2024 X-rays of the right knee show healed patella fracture.  Well-preserved joint spaces.  Osteopenia present.  XR HIP UNILAT W  OR W/O PELVIS 2-3 VIEWS RIGHT Result Date: 06/14/2024 X-rays show a right total hip arthroplasty with revision femoral stem and a greater trochanter plate with associated screws.  The fracture has completely healed.  No evidence of loosening or subsidence of the implants.    PMFS History: Patient Active Problem List   Diagnosis Date Noted   History of revision of total replacement of  right hip joint 07/31/2023   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) 07/30/2023   Closed subcapital fracture of neck of right femur, initial encounter (HCC) 07/16/2023   Closed fracture of right distal radius and ulna, initial encounter 07/16/2023   Past Medical History:  Diagnosis Date   Arthritis    Eating disorder    Hyperlipidemia    Hypertension    Osteoporosis    Right patella fracture    Wrist fracture 2013   right    Family History  Problem Relation Age of Onset   Cancer Father        pancreatic   Diabetes Father    Thyroid  disease Sister        hypothyroid   Osteoporosis Maternal Grandmother    Breast cancer Neg Hx     Past Surgical History:  Procedure Laterality Date   AUGMENTATION MAMMAPLASTY     BREAST ENHANCEMENT SURGERY     CESAREAN SECTION     EXAM UNDER ANESTHESIA WITH MANIPULATION OF KNEE Right 06/06/2020   Procedure: EXAM UNDER ANESTHESIA WITH MANIPULATION OF KNEE;  Surgeon: Barbarann Oneil BROCKS, MD;  Location: [facility];  Service: Orthopedics;  Laterality: Right;   HARDWARE REMOVAL Right 06/06/2020   Procedure: right knee wire removal;  Surgeon: Barbarann Oneil BROCKS, MD;  Location: [facility];  Service: Orthopedics;  Laterality: Right;   HYSTEROSCOPY     OPEN REDUCTION INTERNAL FIXATION (ORIF) DISTAL RADIAL FRACTURE Right 07/16/2023   Procedure: OPEN REDUCTION INTERNAL FIXATION (ORIF) DISTAL RADIUS FRACTURE;  Surgeon: Jerri Kay HERO, MD;  Location: [facility];  Service: Orthopedics;  Laterality: Right;   ORIF PATELLA Right 04/20/2020   Procedure: OPEN REDUCTION INTERNAL (ORIF) FIXATION RIGHT PATELLA;  Surgeon: Barbarann Oneil BROCKS, MD;  Location: [facility];  Service: Orthopedics;  Laterality: Right;   TOTAL HIP ARTHROPLASTY Right 07/16/2023   Procedure: TOTAL HIP ARTHROPLASTY ANTERIOR APPROACH;  Surgeon: Jerri Kay HERO, MD;  Location: [facility];  Service: Orthopedics;  Laterality: Right;   TOTAL HIP REVISION Right 07/31/2023    Procedure: OPEN REDUCTION INTERNAL FIXATION AND REVISION OF RIGHT PERIPROSTHETIC HIP;  Surgeon: Jerri Kay HERO, MD;  Location: [facility];  Service: Orthopedics;  Laterality: Right;   Social History   Occupational History   Not on file  Tobacco Use   Smoking status: Never   Smokeless tobacco: Never  Vaping Use   Vaping status: Never Used  Substance and Sexual Activity   Alcohol use: No    Alcohol/week: 0.0 standard drinks of alcohol   Drug use: No   Sexual activity: Yes    Partners: Male    Birth control/protection: Post-menopausal    Comment: husband vasectomy

## 2024-07-05 DIAGNOSIS — M810 Age-related osteoporosis without current pathological fracture: Secondary | ICD-10-CM | POA: Diagnosis not present

## 2024-07-05 DIAGNOSIS — I10 Essential (primary) hypertension: Secondary | ICD-10-CM | POA: Diagnosis not present

## 2024-07-05 DIAGNOSIS — E7800 Pure hypercholesterolemia, unspecified: Secondary | ICD-10-CM | POA: Diagnosis not present

## 2024-07-13 DIAGNOSIS — M810 Age-related osteoporosis without current pathological fracture: Secondary | ICD-10-CM | POA: Diagnosis not present

## 2024-07-13 DIAGNOSIS — R7303 Prediabetes: Secondary | ICD-10-CM | POA: Diagnosis not present

## 2024-07-13 DIAGNOSIS — Z0000 Encounter for general adult medical examination without abnormal findings: Secondary | ICD-10-CM | POA: Diagnosis not present

## 2024-07-13 DIAGNOSIS — Z1331 Encounter for screening for depression: Secondary | ICD-10-CM | POA: Diagnosis not present

## 2024-07-13 DIAGNOSIS — I10 Essential (primary) hypertension: Secondary | ICD-10-CM | POA: Diagnosis not present

## 2024-07-13 DIAGNOSIS — E7800 Pure hypercholesterolemia, unspecified: Secondary | ICD-10-CM | POA: Diagnosis not present

## 2024-07-13 DIAGNOSIS — Z1211 Encounter for screening for malignant neoplasm of colon: Secondary | ICD-10-CM | POA: Diagnosis not present

## 2024-07-18 DIAGNOSIS — B078 Other viral warts: Secondary | ICD-10-CM | POA: Diagnosis not present

## 2024-08-04 DIAGNOSIS — H5203 Hypermetropia, bilateral: Secondary | ICD-10-CM | POA: Diagnosis not present

## 2024-08-05 ENCOUNTER — Telehealth: Payer: Self-pay | Admitting: Orthopaedic Surgery

## 2024-08-05 DIAGNOSIS — M810 Age-related osteoporosis without current pathological fracture: Secondary | ICD-10-CM | POA: Diagnosis not present

## 2024-08-05 DIAGNOSIS — I10 Essential (primary) hypertension: Secondary | ICD-10-CM | POA: Diagnosis not present

## 2024-08-05 DIAGNOSIS — E7800 Pure hypercholesterolemia, unspecified: Secondary | ICD-10-CM | POA: Diagnosis not present

## 2024-08-05 NOTE — Telephone Encounter (Signed)
 Note is ready for pick up. Patient notified.

## 2024-08-05 NOTE — Telephone Encounter (Signed)
 Pt called stating that she has jury duty on Dec 17 and is wanting to know if she can get a Dr's note to excuse her from it. Pt call back number is [phone]

## 2024-08-05 NOTE — Telephone Encounter (Signed)
 yes

## 2024-08-08 ENCOUNTER — Encounter: Payer: Self-pay | Admitting: Radiology

## 2024-09-04 DIAGNOSIS — E7800 Pure hypercholesterolemia, unspecified: Secondary | ICD-10-CM | POA: Diagnosis not present

## 2024-09-04 DIAGNOSIS — M810 Age-related osteoporosis without current pathological fracture: Secondary | ICD-10-CM | POA: Diagnosis not present

## 2024-09-04 DIAGNOSIS — I10 Essential (primary) hypertension: Secondary | ICD-10-CM | POA: Diagnosis not present

## 2024-10-07 ENCOUNTER — Encounter: Payer: Self-pay | Admitting: Podiatry

## 2024-10-07 ENCOUNTER — Ambulatory Visit: Admitting: Podiatry

## 2024-10-07 DIAGNOSIS — B070 Plantar wart: Secondary | ICD-10-CM | POA: Diagnosis not present

## 2024-10-07 DIAGNOSIS — L600 Ingrowing nail: Secondary | ICD-10-CM

## 2024-10-07 DIAGNOSIS — M79671 Pain in right foot: Secondary | ICD-10-CM

## 2024-10-07 NOTE — Progress Notes (Signed)
 Patient complains of painful ingrown hallux left.  Had matrixectomy several years ago but the nail grew back.  Also complains of painful lesions of the feet bilaterally.  Planes of calluses buildup on the heels.  On the left has been bothering him for the past few weeks.  Some pain at the bunion site on the left where she developed a slight area of irritation and blistering.. Patient denies fevers, chills, nausea, vomiting.  Objective:  Vitals: Reviewed  General: Well developed, nourished, in no acute distress, alert and oriented x3   Vascular: DP pulse 2/4 bilateral. PT pulse 2/4 bilateral.  Minimal edema toe with ingrown nail.  Capillary refill time immediate bilaterally  Dermatology: Erythema, edema, gryphotic nail plate incurvated nail border hallux left with no drainage . Tenderness present with palpation. Normal skin tone and texture feet with normal hair growth.  Verrucous lesions with the skin lines partially go around the lesions and some small hemorrhages plantar aspect second MTP bilaterally and plantar aspect fifth MTP right.  Neurological: Grossly intact. Normal reflexes.  Light touch intact bilaterally  Musculoskeletal: Tenderness with palpation of the distal hallux left. No tenderness or painful ROM at IPJ.  Valgus deformities with tenderness over the medial aspect of the first MTP on the left.  Tailor's bunion deformities bilaterally.  Diagnosis: 1.  Pain feet bilaterally. 2.  Ingrown nail hallux nail left 3.  Plantar verruca feet bilaterally  Plan: -New patient office visit for evaluation and management level 3.  Modifier 25. - Discussed with her the lesions on the plantar aspect of the foot discussed the poor keratoma versus plantar verruca.  Discussed etiologies of both.  She does have some severe hallux valgus deformities.  She does have some irritation over the area of the knees and some tenderness on the left.  She may need thing about surgery on these in the  future.  -discussed etiology and treatment of ingrown nails. Discussed surgical vs conservative treatment. -Consent signed for appropriate matrixectomy affected nail(s).   Procedure 1:   - Matrixectomy(s) hallux left: Toe anesthetized with 3cc 2:1 mixture 2% Lidocaine  with epinephrine: Sodium Bicarbonate. Surgical site prepped. Digital tourniquet applied.  Avulsion of nail plate. performed. Matrixecomy performed with three 30 second applications of phenol to nail matrix. Site irrigated with alcohol.  Tourniquet released with good vascularity noticed in digit.  Applied triple antibiotic to nailbed and applied gauze and Coban dressing. - Written and oral postoperative instructions given.  Procedure 2:  -Applied Salinocaine compound to lesion(s) as noted in physical exam right foot after debriding lesions to pinpoint bleeding.  Salinocaine applied to lesion(s) and covered with an occlusive dressing with Coban wrap.  Written and oral instructions given to patient.     -Return for post-op 2 weeks.  JINNY Prentice Binder, DPM

## 2024-10-07 NOTE — Patient Instructions (Signed)

## 2024-10-26 ENCOUNTER — Encounter: Payer: Self-pay | Admitting: Podiatry

## 2024-10-26 ENCOUNTER — Ambulatory Visit: Admitting: Podiatry

## 2024-10-26 DIAGNOSIS — M722 Plantar fascial fibromatosis: Secondary | ICD-10-CM

## 2024-10-26 DIAGNOSIS — B070 Plantar wart: Secondary | ICD-10-CM | POA: Diagnosis not present

## 2024-10-26 MED ORDER — PREDNISONE 5 MG PO TABS: ORAL_TABLET | ORAL | 1 refills | Status: AC

## 2024-10-26 NOTE — Progress Notes (Signed)
 Patient presents for follow-up nail surgery hallux left.  No complaints.  Verrucous lesions are still bothering her feet bilaterally.  Has a new complaint today of pain at the heel hurts when she first gets up on the morning and stands on it after sitting for a while.  Says has been going on for about a month or 2 now.  She has had previous problems with it in the past.  Does not recall any injury to the feet.   Physical exam:  General appearance: Pleasant, and in no acute distress. AOx3.  Vascular: Pedal pulses: DP 2/4 bilaterally, PT 2/4 bilaterally.  Mild edema lower legs bilaterally. Capillary fill time immediate bilaterally.  Neurological: Light touch intact feet bilaterally.  Normal Achilles reflex bilaterally.  No clonus or spasticity noted.  Negative Tinel sign tarsal tunnel and porta pedis bilaterally  Dermatologic:   Verrucous lesions subfifth MTP and subsecond MTP right foot improved.  Verrucous lesions of the forefoot left foot no change which he did not experience did not was not treated last time.  Skin normal temperature bilaterally.  Skin normal color, tone, and texture bilaterally.  Nail surgery site healing well with no signs of infection hallux left  Musculoskeletal: Tenderness at the plantar medial calcaneal tubercle bilaterally.  Tenderness on the plantar aspect of the calcaneus.  No tenderness lateral compression of the calcaneus bilaterally.    Diagnosis: 1.  Plantar fasciitis bilaterally. 2.  Plantar verruca bilaterally x 3.  Plan: -Establish office visit for evaluation and management level 3.  Modifier 25. -Office visit for separate identifiable problem unrelated to verrucous lesion treatment.  Gave instructions for exercises, prescription for prednisone  and manage and treated condition. -Discussed with the plantar fasciitis and etiology and treatment.  Discussed proper shoes.  Will give her some exercises to do.  Will do a prescription of prednisone  taper for 12  days - Rx prednisone  5 mg, 30 mg p.o. daily first day, then decrease by 5 mg every other day for 12 days. -Gave written instructions of exercises and at home therapy she can do.  - Applied Salinocaine compound to lesion(s) as noted in physical exam after debriding lesions to pinpoint bleeding.  Salinocaine applied to lesion(s) and covered with an occlusive dressing with Coban wrap.  Written and oral instructions given to patient.   Return 2 weeks follow-up plantar verruca bilaterally and plantar fasciitis bilaterally.  Radiographs next visit 3 views bilaterally

## 2024-10-26 NOTE — Patient Instructions (Signed)

## 2024-11-09 ENCOUNTER — Ambulatory Visit: Admitting: Podiatry

## 2024-11-09 ENCOUNTER — Ambulatory Visit

## 2024-11-09 DIAGNOSIS — M722 Plantar fascial fibromatosis: Secondary | ICD-10-CM | POA: Diagnosis not present

## 2024-11-09 DIAGNOSIS — M79671 Pain in right foot: Secondary | ICD-10-CM

## 2024-11-09 DIAGNOSIS — M2012 Hallux valgus (acquired), left foot: Secondary | ICD-10-CM

## 2024-11-09 DIAGNOSIS — M2011 Hallux valgus (acquired), right foot: Secondary | ICD-10-CM

## 2024-11-09 NOTE — Progress Notes (Signed)
 Patient presents follow-up plantar fasciitis and plantar verruca.  Verrucous lesion areas are feeling well with no problem.  No complaint of pain.  Says the plantar fasciitis is feeling much better just a little bit of soreness at the heel right now.  Says her bunion deformities do not bother her that much.   Physical exam:  General appearance: Pleasant, and in no acute distress. AOx3.  Vascular: Pedal pulses: DP 2/4 bilaterally, PT 2/4 bilaterally.  Mild mild edema lower legs bilaterally. Capillary fill time immediate bilaterally.  Neurological: Grossly intact bilaterally.  Negative Tinel's sign tarsal tunnel and porta pedis bilaterally  Dermatologic:   Verrucous lesions have resolved feet bilaterally.  Skin normal temperature bilaterally.  Skin normal color, tone, and texture bilaterally.  Skin somewhat hyper hidrotic.  Musculoskeletal: Slight soreness at the medial plantar calcaneal tubercle bilaterally.  Much less tender than last time.  No tenderness lateral compression of calcaneus.  Hallux valgus deformity bilaterally most severe on the left.  Some soreness to palpation over the medial aspect of the first metatarsal phalangeal joint left.  Tailor's bunion deformities bilaterally  Radiographs: 3 views feet bilaterally: Severe hallux valgus deformities with increased intermetatarsal angle and subluxation of the first metatarsal phalangeal joint laterally bilaterally.  Notes any fractures or dislocations.  Tailor's bunion deformity with increased fourth fifth intermetatarsal angle bilaterally  Diagnosis: 1.  Plantar fasciitis left 2.  Hallux valgus bilaterally  Plan: -Established office visit for evaluation and management level 3. - Discussed the hallux valgus deformities and etiology and treatment given the minimal problems she has but these would recommend is leaving them be.  Wear good shoes with a wide toebox to avoid pressure on them.  Verrucous lesions appear to have resolved.   Keep an eye on these for recurrence.  Discussed with the plantar fasciitis just wear good stable supportive shoes at this point she can use OTC diclofenac gel bilaterally.  Recommended some noncotton socks for moisture control in the feet.   Return as needed
